# Patient Record
Sex: Female | Born: 1975 | Race: Black or African American | Hispanic: No | Marital: Married | Smoking: Never smoker
Health system: Southern US, Community
[De-identification: ages and names within clinical notes are randomized; demographics above are authoritative.]

## PROBLEM LIST (undated history)

## (undated) DIAGNOSIS — Z9289 Personal history of other medical treatment: Secondary | ICD-10-CM

## (undated) DIAGNOSIS — R519 Headache, unspecified: Secondary | ICD-10-CM

## (undated) DIAGNOSIS — J45909 Unspecified asthma, uncomplicated: Secondary | ICD-10-CM

## (undated) DIAGNOSIS — D649 Anemia, unspecified: Secondary | ICD-10-CM

## (undated) DIAGNOSIS — K589 Irritable bowel syndrome without diarrhea: Secondary | ICD-10-CM

## (undated) DIAGNOSIS — G5603 Carpal tunnel syndrome, bilateral upper limbs: Secondary | ICD-10-CM

## (undated) DIAGNOSIS — E282 Polycystic ovarian syndrome: Secondary | ICD-10-CM

## (undated) DIAGNOSIS — R51 Headache: Secondary | ICD-10-CM

## (undated) DIAGNOSIS — Z8742 Personal history of other diseases of the female genital tract: Secondary | ICD-10-CM

## (undated) DIAGNOSIS — F419 Anxiety disorder, unspecified: Secondary | ICD-10-CM

## (undated) DIAGNOSIS — Z8759 Personal history of other complications of pregnancy, childbirth and the puerperium: Secondary | ICD-10-CM

## (undated) HISTORY — DX: Anxiety disorder, unspecified: F41.9

## (undated) HISTORY — PX: OTHER SURGICAL HISTORY: SHX169

## (undated) HISTORY — DX: Polycystic ovarian syndrome: E28.2

## (undated) HISTORY — DX: Anemia, unspecified: D64.9

---

## 1999-05-03 DIAGNOSIS — E282 Polycystic ovarian syndrome: Secondary | ICD-10-CM | POA: Insufficient documentation

## 1999-07-01 ENCOUNTER — Ambulatory Visit (HOSPITAL_COMMUNITY): Admission: RE | Admit: 1999-07-01 | Discharge: 1999-07-01 | Payer: Self-pay | Admitting: Obstetrics and Gynecology

## 1999-12-12 ENCOUNTER — Other Ambulatory Visit: Admission: RE | Admit: 1999-12-12 | Discharge: 1999-12-12 | Payer: Self-pay | Admitting: Obstetrics and Gynecology

## 2001-02-08 ENCOUNTER — Other Ambulatory Visit: Admission: RE | Admit: 2001-02-08 | Discharge: 2001-02-08 | Payer: Self-pay | Admitting: *Deleted

## 2002-02-17 ENCOUNTER — Emergency Department (HOSPITAL_COMMUNITY): Admission: EM | Admit: 2002-02-17 | Discharge: 2002-02-17 | Payer: Self-pay | Admitting: Emergency Medicine

## 2002-06-09 ENCOUNTER — Emergency Department (HOSPITAL_COMMUNITY): Admission: EM | Admit: 2002-06-09 | Discharge: 2002-06-09 | Payer: Self-pay | Admitting: Emergency Medicine

## 2003-08-17 ENCOUNTER — Emergency Department (HOSPITAL_COMMUNITY): Admission: EM | Admit: 2003-08-17 | Discharge: 2003-08-17 | Payer: Self-pay | Admitting: Emergency Medicine

## 2006-06-01 ENCOUNTER — Emergency Department (HOSPITAL_COMMUNITY): Admission: EM | Admit: 2006-06-01 | Discharge: 2006-06-01 | Payer: Self-pay | Admitting: Emergency Medicine

## 2007-07-29 ENCOUNTER — Emergency Department (HOSPITAL_COMMUNITY): Admission: EM | Admit: 2007-07-29 | Discharge: 2007-07-29 | Payer: Self-pay | Admitting: Emergency Medicine

## 2007-08-27 ENCOUNTER — Ambulatory Visit: Payer: Self-pay | Admitting: Internal Medicine

## 2007-08-27 LAB — CONVERTED CEMR LAB
Basophils Absolute: 0 10*3/uL (ref 0.0–0.1)
Basophils Relative: 0 % (ref 0–1)
Eosinophils Relative: 0 % (ref 0–5)
HCT: 30.8 % — ABNORMAL LOW (ref 36.0–46.0)
Hemoglobin: 10 g/dL — ABNORMAL LOW (ref 12.0–15.0)
Lymphocytes Relative: 33 % (ref 12–46)
MCHC: 32.5 g/dL (ref 30.0–36.0)
Monocytes Absolute: 0.5 10*3/uL (ref 0.2–0.7)
Platelets: 295 10*3/uL (ref 150–400)
RDW: 13.8 % (ref 11.5–14.0)
TSH: 0.61 microintl units/mL (ref 0.350–5.50)

## 2007-10-28 DIAGNOSIS — N949 Unspecified condition associated with female genital organs and menstrual cycle: Secondary | ICD-10-CM

## 2007-10-28 DIAGNOSIS — N946 Dysmenorrhea, unspecified: Secondary | ICD-10-CM

## 2008-12-11 ENCOUNTER — Ambulatory Visit (HOSPITAL_COMMUNITY): Admission: RE | Admit: 2008-12-11 | Discharge: 2008-12-11 | Payer: Self-pay | Admitting: *Deleted

## 2009-01-30 ENCOUNTER — Ambulatory Visit (HOSPITAL_COMMUNITY): Admission: RE | Admit: 2009-01-30 | Discharge: 2009-01-30 | Payer: Self-pay | Admitting: Family Medicine

## 2009-03-01 ENCOUNTER — Ambulatory Visit (HOSPITAL_COMMUNITY): Admission: RE | Admit: 2009-03-01 | Discharge: 2009-03-01 | Payer: Self-pay | Admitting: Family Medicine

## 2009-03-07 ENCOUNTER — Inpatient Hospital Stay (HOSPITAL_COMMUNITY): Admission: AD | Admit: 2009-03-07 | Discharge: 2009-03-07 | Payer: Self-pay | Admitting: Family Medicine

## 2009-03-07 ENCOUNTER — Ambulatory Visit: Payer: Self-pay | Admitting: Physician Assistant

## 2009-05-25 ENCOUNTER — Inpatient Hospital Stay (HOSPITAL_COMMUNITY): Admission: AD | Admit: 2009-05-25 | Discharge: 2009-05-25 | Payer: Self-pay | Admitting: Obstetrics & Gynecology

## 2009-05-25 ENCOUNTER — Ambulatory Visit: Payer: Self-pay | Admitting: Obstetrics and Gynecology

## 2009-06-21 ENCOUNTER — Ambulatory Visit: Payer: Self-pay | Admitting: Advanced Practice Midwife

## 2009-06-21 ENCOUNTER — Inpatient Hospital Stay (HOSPITAL_COMMUNITY): Admission: AD | Admit: 2009-06-21 | Discharge: 2009-06-21 | Payer: Self-pay | Admitting: Obstetrics & Gynecology

## 2009-06-23 ENCOUNTER — Inpatient Hospital Stay (HOSPITAL_COMMUNITY): Admission: AD | Admit: 2009-06-23 | Discharge: 2009-06-23 | Payer: Self-pay | Admitting: Obstetrics & Gynecology

## 2009-06-26 ENCOUNTER — Ambulatory Visit (HOSPITAL_COMMUNITY): Admission: RE | Admit: 2009-06-26 | Discharge: 2009-06-26 | Payer: Self-pay | Admitting: Obstetrics & Gynecology

## 2009-06-29 ENCOUNTER — Ambulatory Visit: Payer: Self-pay | Admitting: Obstetrics and Gynecology

## 2009-06-29 ENCOUNTER — Inpatient Hospital Stay (HOSPITAL_COMMUNITY): Admission: AD | Admit: 2009-06-29 | Discharge: 2009-06-29 | Payer: Self-pay | Admitting: Obstetrics & Gynecology

## 2009-07-03 ENCOUNTER — Ambulatory Visit: Payer: Self-pay | Admitting: Obstetrics & Gynecology

## 2009-07-03 ENCOUNTER — Inpatient Hospital Stay (HOSPITAL_COMMUNITY): Admission: AD | Admit: 2009-07-03 | Discharge: 2009-07-07 | Payer: Self-pay | Admitting: Obstetrics & Gynecology

## 2009-07-05 HISTORY — PX: OTHER SURGICAL HISTORY: SHX169

## 2009-07-09 ENCOUNTER — Inpatient Hospital Stay (HOSPITAL_COMMUNITY): Admission: AD | Admit: 2009-07-09 | Discharge: 2009-07-09 | Payer: Self-pay | Admitting: Obstetrics & Gynecology

## 2009-07-09 ENCOUNTER — Encounter: Payer: Self-pay | Admitting: Obstetrics & Gynecology

## 2009-07-09 ENCOUNTER — Ambulatory Visit: Payer: Self-pay | Admitting: Vascular Surgery

## 2009-07-11 ENCOUNTER — Ambulatory Visit: Payer: Self-pay | Admitting: Obstetrics & Gynecology

## 2009-07-11 ENCOUNTER — Inpatient Hospital Stay (HOSPITAL_COMMUNITY): Admission: AD | Admit: 2009-07-11 | Discharge: 2009-07-15 | Payer: Self-pay | Admitting: Obstetrics & Gynecology

## 2009-07-11 ENCOUNTER — Ambulatory Visit: Payer: Self-pay | Admitting: Cardiovascular Disease

## 2009-07-12 ENCOUNTER — Encounter: Payer: Self-pay | Admitting: Obstetrics & Gynecology

## 2009-07-18 ENCOUNTER — Encounter: Payer: Self-pay | Admitting: Physician Assistant

## 2009-07-18 ENCOUNTER — Ambulatory Visit: Payer: Self-pay | Admitting: Obstetrics & Gynecology

## 2009-07-18 LAB — CONVERTED CEMR LAB
Basophils Absolute: 0 10*3/uL (ref 0.0–0.1)
Basophils Relative: 0 % (ref 0–1)
Lymphocytes Relative: 22 % (ref 12–46)
MCHC: 31.6 g/dL (ref 30.0–36.0)
Neutro Abs: 3.1 10*3/uL (ref 1.7–7.7)
Neutrophils Relative %: 65 % (ref 43–77)
Platelets: 355 10*3/uL (ref 150–400)
RDW: 15.2 % (ref 11.5–15.5)

## 2009-07-20 ENCOUNTER — Inpatient Hospital Stay (HOSPITAL_COMMUNITY): Admission: AD | Admit: 2009-07-20 | Discharge: 2009-07-20 | Payer: Self-pay | Admitting: Obstetrics & Gynecology

## 2009-07-20 ENCOUNTER — Ambulatory Visit: Payer: Self-pay | Admitting: Obstetrics & Gynecology

## 2009-07-25 ENCOUNTER — Ambulatory Visit: Payer: Self-pay | Admitting: Obstetrics and Gynecology

## 2009-08-01 ENCOUNTER — Ambulatory Visit: Payer: Self-pay | Admitting: Obstetrics and Gynecology

## 2009-08-01 ENCOUNTER — Encounter: Payer: Self-pay | Admitting: Obstetrics & Gynecology

## 2009-08-01 LAB — CONVERTED CEMR LAB
Basophils Absolute: 0 10*3/uL (ref 0.0–0.1)
Basophils Relative: 0 % (ref 0–1)
Eosinophils Absolute: 0 10*3/uL (ref 0.0–0.7)
Eosinophils Relative: 1 % (ref 0–5)
HCT: 32 % — ABNORMAL LOW (ref 36.0–46.0)
Hemoglobin: 10.6 g/dL — ABNORMAL LOW (ref 12.0–15.0)
Lymphocytes Relative: 33 % (ref 12–46)
Lymphs Abs: 1.7 10*3/uL (ref 0.7–4.0)
MCHC: 33.1 g/dL (ref 30.0–36.0)
MCV: 91.4 fL (ref 78.0–100.0)
Monocytes Absolute: 0.4 10*3/uL (ref 0.1–1.0)
Monocytes Relative: 8 % (ref 3–12)
Neutro Abs: 3 10*3/uL (ref 1.7–7.7)
Neutrophils Relative %: 59 % (ref 43–77)
Platelets: 417 10*3/uL — ABNORMAL HIGH (ref 150–400)
RBC: 3.5 M/uL — ABNORMAL LOW (ref 3.87–5.11)
RDW: 14.2 % (ref 11.5–15.5)
WBC: 5.1 10*3/uL (ref 4.0–10.5)

## 2009-08-10 ENCOUNTER — Ambulatory Visit: Payer: Self-pay | Admitting: Obstetrics & Gynecology

## 2010-01-11 IMAGING — CT CT ABDOMEN W/ CM
3 of 4 series · 14 of 32 positions shown, 19 images · IV contrast ([ID] GASTRO-MX & 150ml omni/300%)
Comparison: 07/29/2007

CT ABDOMEN

CLINICAL DATA: 2 weeks postpartum vaginal delivery, left lower
quadrant pelvic pain and fever.

CT ABDOMEN AND PELVIS WITH CONTRAST
TECHNIQUE: Multidetector CT imaging of the abdomen and pelvis was
performed using the standard protocol following bolus
administration of intravenous contrast.
Contrast: 150 ml Omniscan 300 IV contrast

[Series 2: abd pelvis · axial · 0.79mm/px · z∈[-441,-151]mm · 4 of 93 slices shown, 9 images]
[im 19/93  soft-tissue]
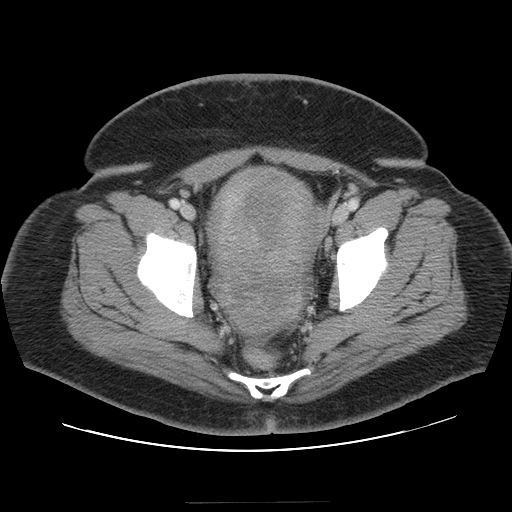
[im 19/93  lung]
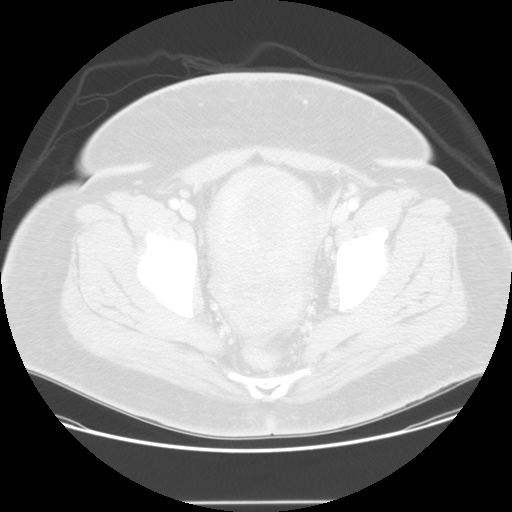
[im 19/93  bone]
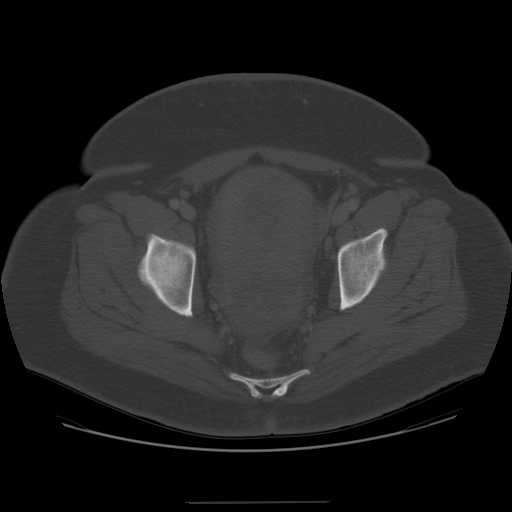
[im 37/93  soft-tissue]
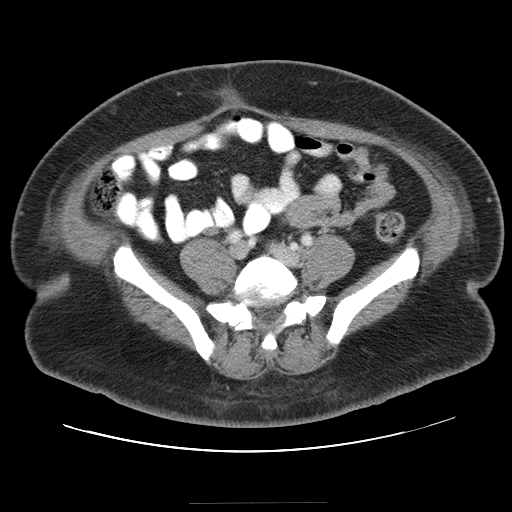
[im 37/93  lung]
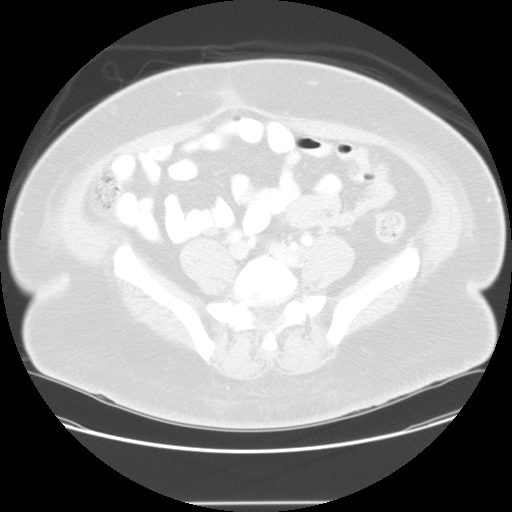
[im 56/93  soft-tissue]
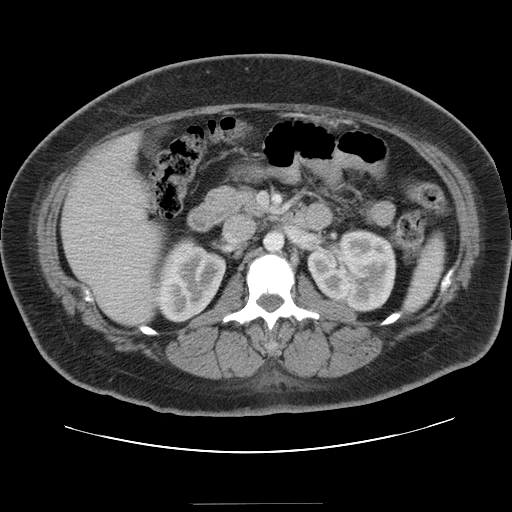
[im 56/93  lung]
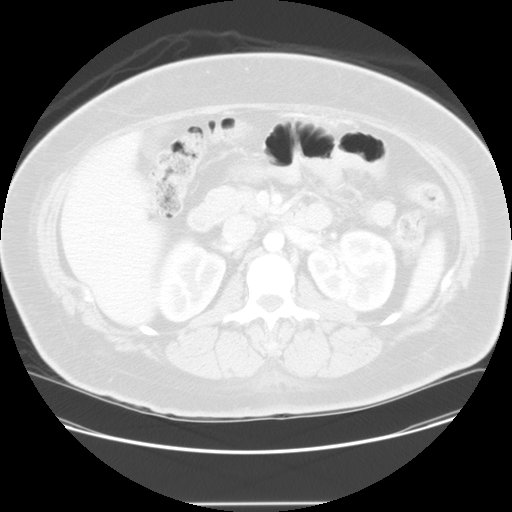
[im 74/93  soft-tissue]
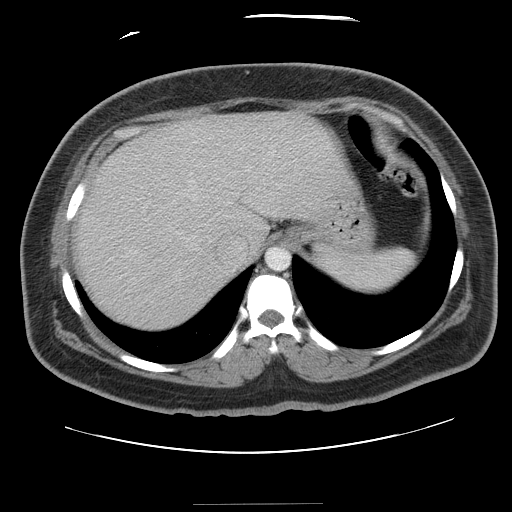
[im 74/93  lung]
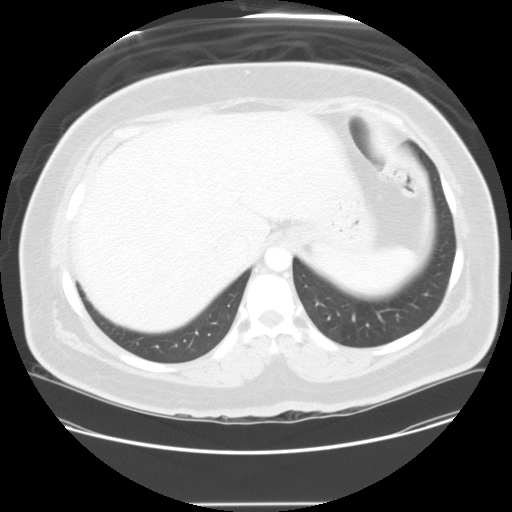

[Series 400: reformatted · coronal · 0.99mm/px · 2 of 169 slices shown (1 of 2)]
[im 17/169  soft-tissue]
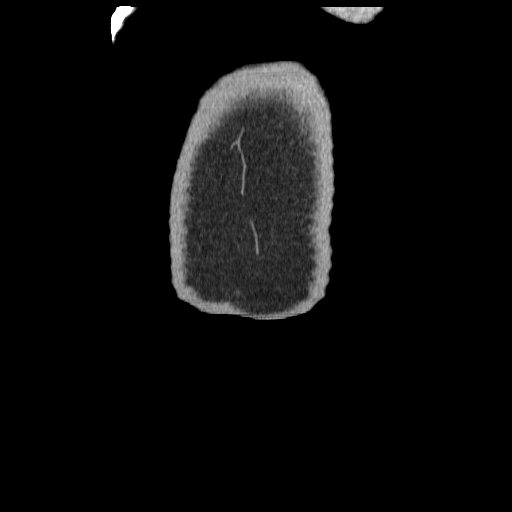
[im 34/169  soft-tissue]
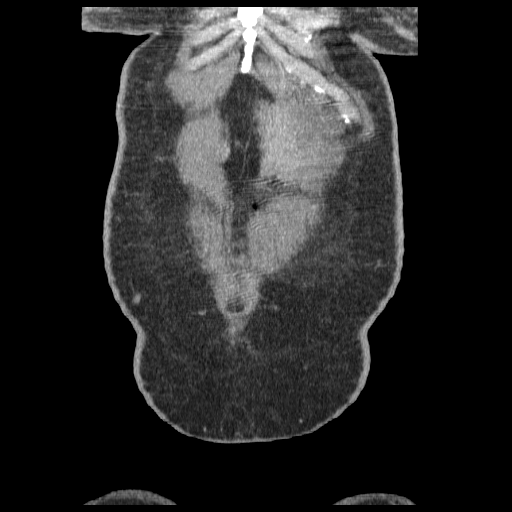

[Series 401: reformatted · sagittal · 0.99mm/px · 8 of 192 slices shown (2 of 2)]
[im 16/192  soft-tissue]
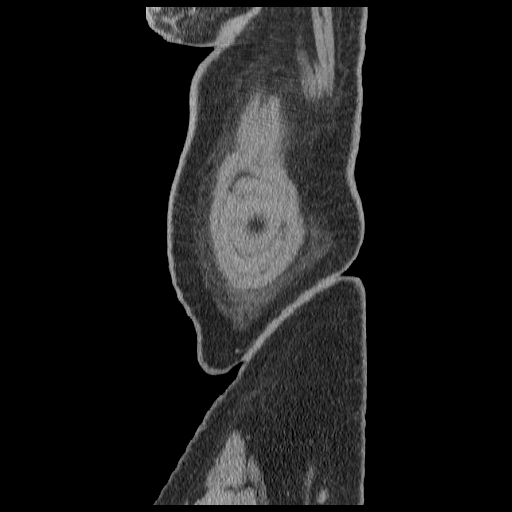
[im 48/192  soft-tissue]
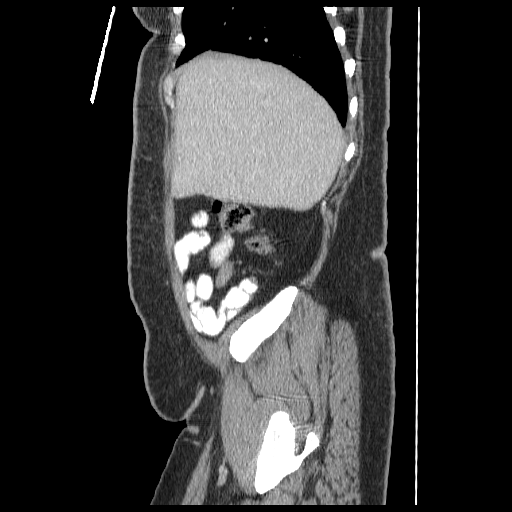
[im 64/192  soft-tissue]
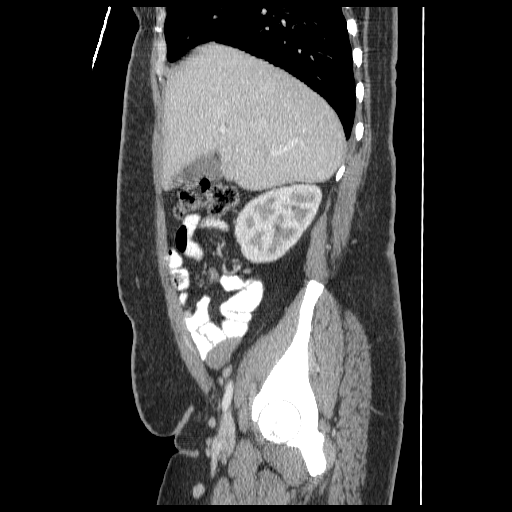
[im 80/192  soft-tissue]
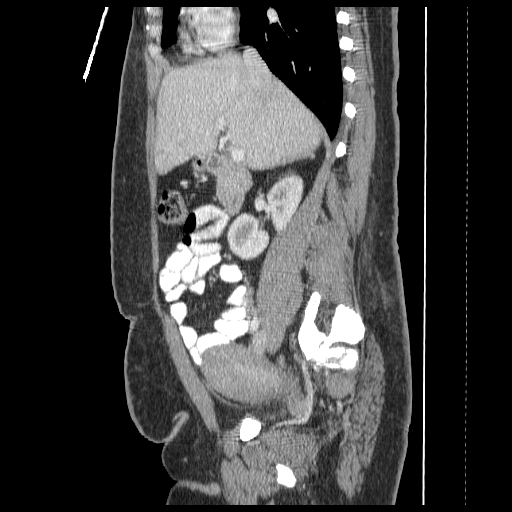
[im 112/192  soft-tissue]
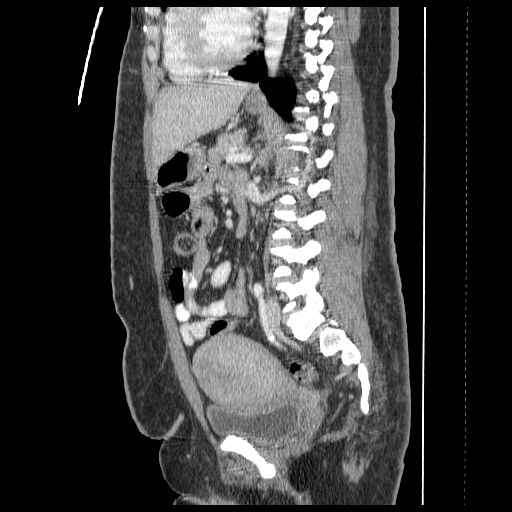
[im 128/192  soft-tissue]
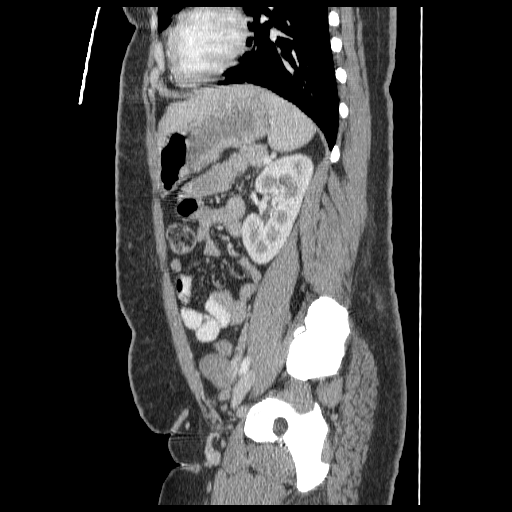
[im 144/192  soft-tissue]
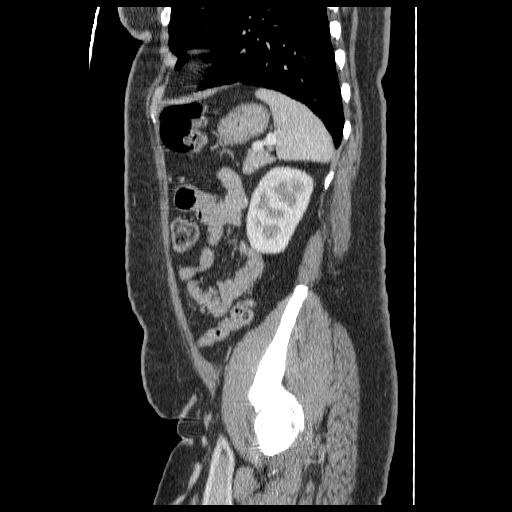
[im 176/192  soft-tissue]
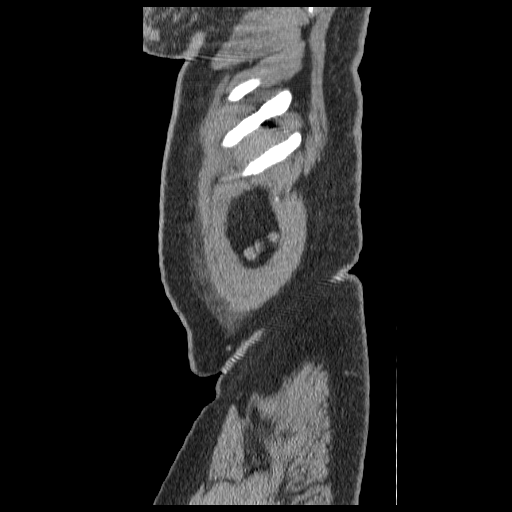

[14 of 32 positions shown; findings below may reference images not displayed]

FINDINGS: 2 mm nonobstructing right upper renal pole calculus
incidentally identified.  Left renal cortical too small to
characterize hypodensity noted on image 41 and 36.  Liver,
gallbladder, pancreas, adrenal glands, and spleen are unremarkable.
No ureteral calculus identified.  No lymphadenopathy or free fluid.
IMPRESSION: No acute intra-abdominal finding.

CT PELVIS
FINDINGS: Uterus and ovaries are unremarkable for the patient's
recently postpartum state.  Trace pelvic free fluid identified.
Appendix and bowel are unremarkable.  No acute osseous finding.
IMPRESSION: No acute intrapelvic process.  Normal postpartum appearance of the
uterus and ovaries.

## 2011-01-20 ENCOUNTER — Encounter: Payer: Self-pay | Admitting: *Deleted

## 2011-04-05 LAB — POCT URINALYSIS DIP (DEVICE)
Glucose, UA: NEGATIVE mg/dL
Ketones, ur: NEGATIVE mg/dL
Nitrite: NEGATIVE
Specific Gravity, Urine: 1.01 (ref 1.005–1.030)
pH: 6.5 (ref 5.0–8.0)

## 2011-04-06 LAB — COMPREHENSIVE METABOLIC PANEL
ALT: 43 U/L — ABNORMAL HIGH (ref 0–35)
ALT: 73 U/L — ABNORMAL HIGH (ref 0–35)
AST: 42 U/L — ABNORMAL HIGH (ref 0–37)
AST: 32 U/L (ref 0–37)
AST: 48 U/L — ABNORMAL HIGH (ref 0–37)
AST: 66 U/L — ABNORMAL HIGH (ref 0–37)
AST: 78 U/L — ABNORMAL HIGH (ref 0–37)
Albumin: 3.6 g/dL (ref 3.5–5.2)
Albumin: 2.5 g/dL — ABNORMAL LOW (ref 3.5–5.2)
Albumin: 2.7 g/dL — ABNORMAL LOW (ref 3.5–5.2)
Albumin: 2.8 g/dL — ABNORMAL LOW (ref 3.5–5.2)
Alkaline Phosphatase: 105 U/L (ref 39–117)
Alkaline Phosphatase: 115 U/L (ref 39–117)
Alkaline Phosphatase: 146 U/L — ABNORMAL HIGH (ref 39–117)
BUN: 10 mg/dL (ref 6–23)
BUN: 11 mg/dL (ref 6–23)
BUN: 12 mg/dL (ref 6–23)
BUN: 9 mg/dL (ref 6–23)
CO2: 24 mEq/L (ref 19–32)
CO2: 22 mEq/L (ref 19–32)
CO2: 23 mEq/L (ref 19–32)
CO2: 24 mEq/L (ref 19–32)
Calcium: 9 mg/dL (ref 8.4–10.5)
Calcium: 8.5 mg/dL (ref 8.4–10.5)
Calcium: 8.8 mg/dL (ref 8.4–10.5)
Calcium: 8.8 mg/dL (ref 8.4–10.5)
Chloride: 107 mEq/L (ref 96–112)
Chloride: 108 mEq/L (ref 96–112)
Chloride: 108 mEq/L (ref 96–112)
Creatinine, Ser: 0.73 mg/dL (ref 0.4–1.2)
Creatinine, Ser: 0.48 mg/dL (ref 0.4–1.2)
Creatinine, Ser: 0.61 mg/dL (ref 0.4–1.2)
Creatinine, Ser: 0.72 mg/dL (ref 0.4–1.2)
Creatinine, Ser: 0.83 mg/dL (ref 0.4–1.2)
GFR calc Af Amer: >60 mL/min (ref >60)
GFR calc Af Amer: >60 mL/min (ref >60)
GFR calc Af Amer: >60 mL/min (ref >60)
GFR calc Af Amer: >60 mL/min (ref >60)
GFR calc Af Amer: >60 mL/min (ref >60)
GFR calc non Af Amer: >60 mL/min (ref >60)
GFR calc non Af Amer: >60 mL/min (ref >60)
GFR calc non Af Amer: >60 mL/min (ref >60)
GFR calc non Af Amer: >60 mL/min (ref >60)
GFR calc non Af Amer: >60 mL/min (ref >60)
Glucose, Bld: 92 mg/dL (ref 70–99)
Glucose, Bld: 118 mg/dL — ABNORMAL HIGH (ref 70–99)
Glucose, Bld: 80 mg/dL (ref 70–99)
Glucose, Bld: 87 mg/dL (ref 70–99)
Potassium: 4 mEq/L (ref 3.5–5.1)
Potassium: 3.4 mEq/L — ABNORMAL LOW (ref 3.5–5.1)
Potassium: 4 mEq/L (ref 3.5–5.1)
Potassium: 4 mEq/L (ref 3.5–5.1)
Sodium: 140 mEq/L (ref 135–145)
Sodium: 137 mEq/L (ref 135–145)
Sodium: 140 mEq/L (ref 135–145)
Total Bilirubin: 0.2 mg/dL — ABNORMAL LOW (ref 0.3–1.2)
Total Bilirubin: 0.4 mg/dL (ref 0.3–1.2)
Total Bilirubin: 0.5 mg/dL (ref 0.3–1.2)
Total Protein: 6 g/dL (ref 6.0–8.3)
Total Protein: 6.4 g/dL (ref 6.0–8.3)
Total Protein: 4.9 g/dL — ABNORMAL LOW (ref 6.0–8.3)
Total Protein: 5.5 g/dL — ABNORMAL LOW (ref 6.0–8.3)
Total Protein: 5.9 g/dL — ABNORMAL LOW (ref 6.0–8.3)

## 2011-04-06 LAB — URINALYSIS, ROUTINE W REFLEX MICROSCOPIC
Bilirubin Urine: NEGATIVE
Bilirubin Urine: NEGATIVE
Glucose, UA: NEGATIVE mg/dL
Hgb urine dipstick: NEGATIVE
Hgb urine dipstick: NEGATIVE
Ketones, ur: NEGATIVE mg/dL
Ketones, ur: NEGATIVE mg/dL
Nitrite: NEGATIVE
Protein, ur: NEGATIVE mg/dL
Specific Gravity, Urine: <1.005 — ABNORMAL LOW (ref 1.005–1.030)
Urobilinogen, UA: 0.2 mg/dL (ref 0.0–1.0)
Urobilinogen, UA: 0.2 mg/dL (ref 0.0–1.0)
pH: 7.5 (ref 5.0–8.0)

## 2011-04-06 LAB — BRAIN NATRIURETIC PEPTIDE: Pro B Natriuretic peptide (BNP): 163 pg/mL — ABNORMAL HIGH (ref 0.0–100.0)

## 2011-04-06 LAB — CBC
HCT: 25.1 % — ABNORMAL LOW (ref 36.0–46.0)
HCT: 17.9 % — ABNORMAL LOW (ref 36.0–46.0)
HCT: 19 % — ABNORMAL LOW (ref 36.0–46.0)
HCT: 19.2 % — ABNORMAL LOW (ref 36.0–46.0)
HCT: 20.7 % — ABNORMAL LOW (ref 36.0–46.0)
HCT: 24.6 % — ABNORMAL LOW (ref 36.0–46.0)
HCT: 32.6 % — ABNORMAL LOW (ref 36.0–46.0)
Hemoglobin: 8.7 g/dL — ABNORMAL LOW (ref 12.0–15.0)
Hemoglobin: 10.1 g/dL — ABNORMAL LOW (ref 12.0–15.0)
Hemoglobin: 6.7 g/dL — CL (ref 12.0–15.0)
Hemoglobin: 7.6 g/dL — CL (ref 12.0–15.0)
MCHC: 34.6 g/dL (ref 30.0–36.0)
MCHC: 34.4 g/dL (ref 30.0–36.0)
MCHC: 34.7 g/dL (ref 30.0–36.0)
MCHC: 34.9 g/dL (ref 30.0–36.0)
MCHC: 34.9 g/dL (ref 30.0–36.0)
MCHC: 35.2 g/dL (ref 30.0–36.0)
MCHC: 35.6 g/dL (ref 30.0–36.0)
MCV: 97.9 fL (ref 78.0–100.0)
MCV: 98.5 fL (ref 78.0–100.0)
MCV: 98.8 fL (ref 78.0–100.0)
MCV: 99 fL (ref 78.0–100.0)
MCV: 99.3 fL (ref 78.0–100.0)
Platelets: 274 10*3/uL (ref 150–400)
Platelets: 299 10*3/uL (ref 150–400)
Platelets: 175 10*3/uL (ref 150–400)
Platelets: 176 10*3/uL (ref 150–400)
Platelets: 183 10*3/uL (ref 150–400)
Platelets: 222 10*3/uL (ref 150–400)
Platelets: 282 10*3/uL (ref 150–400)
RBC: 2.12 MIL/uL — ABNORMAL LOW (ref 3.87–5.11)
RBC: 1.92 MIL/uL — ABNORMAL LOW (ref 3.87–5.11)
RBC: 2.75 MIL/uL — ABNORMAL LOW (ref 3.87–5.11)
RBC: 2.89 MIL/uL — ABNORMAL LOW (ref 3.87–5.11)
RBC: 3.3 MIL/uL — ABNORMAL LOW (ref 3.87–5.11)
RDW: 15.3 % (ref 11.5–15.5)
RDW: 15 % (ref 11.5–15.5)
RDW: 15 % (ref 11.5–15.5)
RDW: 15.1 % (ref 11.5–15.5)
RDW: 15.3 % (ref 11.5–15.5)
RDW: 15.6 % — ABNORMAL HIGH (ref 11.5–15.5)
WBC: 6 10*3/uL (ref 4.0–10.5)
WBC: 7.9 10*3/uL (ref 4.0–10.5)
WBC: 8.2 10*3/uL (ref 4.0–10.5)

## 2011-04-06 LAB — BASIC METABOLIC PANEL
BUN: 10 mg/dL (ref 6–23)
BUN: 8 mg/dL (ref 6–23)
Calcium: 8.5 mg/dL (ref 8.4–10.5)
Calcium: 8.6 mg/dL (ref 8.4–10.5)
Creatinine, Ser: 0.7 mg/dL (ref 0.4–1.2)
Creatinine, Ser: 0.74 mg/dL (ref 0.4–1.2)
GFR calc Af Amer: >60 mL/min (ref >60)
GFR calc non Af Amer: >60 mL/min (ref >60)
GFR calc non Af Amer: >60 mL/min (ref >60)
Glucose, Bld: 81 mg/dL (ref 70–99)
Potassium: 3.8 mEq/L (ref 3.5–5.1)

## 2011-04-06 LAB — CROSSMATCH
ABO/RH(D): O POS
Antibody Screen: NEGATIVE

## 2011-04-06 LAB — URINE MICROSCOPIC-ADD ON

## 2011-04-06 LAB — URIC ACID: Uric Acid, Serum: 4.3 mg/dL (ref 2.4–7.0)

## 2011-04-06 LAB — LACTATE DEHYDROGENASE: LDH: 151 U/L (ref 94–250)

## 2011-04-06 LAB — CULTURE, BLOOD (ROUTINE X 2)
Culture: NO GROWTH
Culture: NO GROWTH

## 2011-04-06 LAB — TSH: TSH: 2.555 u[IU]/mL (ref 0.350–4.500)

## 2011-04-06 LAB — FIBRINOGEN: Fibrinogen: 445 mg/dL (ref 204–475)

## 2011-04-06 LAB — CCBB MATERNAL DONOR DRAW

## 2011-04-07 LAB — COMPREHENSIVE METABOLIC PANEL
ALT: 18 U/L (ref 0–35)
AST: 22 U/L (ref 0–37)
Albumin: 2.5 g/dL — ABNORMAL LOW (ref 3.5–5.2)
CO2: 21 mEq/L (ref 19–32)
Calcium: 8.8 mg/dL (ref 8.4–10.5)
Chloride: 110 mEq/L (ref 96–112)
Creatinine, Ser: 0.53 mg/dL (ref 0.4–1.2)
GFR calc Af Amer: >60 mL/min (ref >60)
Sodium: 138 mEq/L (ref 135–145)

## 2011-04-07 LAB — CBC
MCHC: 35.7 g/dL (ref 30.0–36.0)
MCV: 97.5 fL (ref 78.0–100.0)
Platelets: 159 10*3/uL (ref 150–400)
RBC: 2.7 MIL/uL — ABNORMAL LOW (ref 3.87–5.11)
WBC: 8.5 10*3/uL (ref 4.0–10.5)

## 2011-04-07 LAB — PROTEIN, URINE, 24 HOUR
Collection Interval-UPROT: 24 hours
Protein, Urine: <3 mg/dL
Urine Total Volume-UPROT: 5950 mL

## 2011-04-07 LAB — URINALYSIS, DIPSTICK ONLY
Glucose, UA: NEGATIVE mg/dL
Ketones, ur: NEGATIVE mg/dL
Protein, ur: NEGATIVE mg/dL
Urobilinogen, UA: 0.2 mg/dL (ref 0.0–1.0)

## 2011-04-07 LAB — CREATININE CLEARANCE, URINE, 24 HOUR
Creatinine Clearance: 192 mL/min — ABNORMAL HIGH (ref 75–115)
Creatinine, 24H Ur: 1685 mg/d (ref 700–1800)
Creatinine: 0.61 mg/dL (ref 0.40–1.20)

## 2011-04-08 LAB — CBC
HCT: 30.8 % — ABNORMAL LOW (ref 36.0–46.0)
MCHC: 35.7 g/dL (ref 30.0–36.0)
MCV: 97.5 fL (ref 78.0–100.0)
Platelets: 183 10*3/uL (ref 150–400)
RDW: 13.4 % (ref 11.5–15.5)
WBC: 9.3 10*3/uL (ref 4.0–10.5)

## 2011-04-10 LAB — COMPREHENSIVE METABOLIC PANEL
AST: 22 U/L (ref 0–37)
Albumin: 3.3 g/dL — ABNORMAL LOW (ref 3.5–5.2)
Alkaline Phosphatase: 49 U/L (ref 39–117)
BUN: 5 mg/dL — ABNORMAL LOW (ref 6–23)
GFR calc Af Amer: >60 mL/min (ref >60)
Potassium: 3.6 mEq/L (ref 3.5–5.1)
Total Protein: 6.3 g/dL (ref 6.0–8.3)

## 2011-04-10 LAB — CBC
HCT: 30.5 % — ABNORMAL LOW (ref 36.0–46.0)
Platelets: 205 10*3/uL (ref 150–400)
RDW: 13.9 % (ref 11.5–15.5)

## 2011-05-13 NOTE — Discharge Summary (Signed)
NAME:  Erica Lang, Erica Lang NO.:  [id number]   MEDICAL RECORD NO.:  [id number]          PATIENT TYPE:  INP   LOCATION:  9305                          [facility]   PHYSICIAN:  Allie Bossier, MD        DATE OF BIRTH:  12-03-1976   DATE OF ADMISSION:  07/11/2009  DATE OF DISCHARGE:  07/15/2009                               DISCHARGE SUMMARY   ADMISSION DIAGNOSIS:  Postpartum pulmonary edema.   OTHER ADMITTING DIAGNOSES:  Morbid obesity, hypertension, history of  preeclampsia with recent vaginal delivery, and anemia.   CONDITION:  Stable.   DISPOSITION:  Home.   DIET:  4 g of sodium diet.   FOLLOWUP:  Follow up 1 week in the GYN Clinic and follow up in 1 month  with the [facility]iology [facility].   HISTORY OF PRESENT ILLNESS AND HOSPITAL COURSE:  Erica Lang is a 35-  year-old, who was delivered vaginally a baby on July 05, 2009.  During  that time, she was treated for preeclampsia with magnesium sulfate,  please note that her she was anemic at that time, and a vaginal hematoma  was developed.  She did receive transfusions during the hospital stay.  She returned to the hospital on July 11, 2009, secondary to significant  shortness of breath.  Please note that she had been receiving  hydrochlorothiazide when she was discharged to home.  However, she had  been given a prescription for hydrochlorothiazide 25 mg when she was  discharged to home after her delivery.  She returned on July 11, 2009,  shortness of breath and was admitted to the ICU for diuresis.  A  Cardiology consult was obtained.  An echocardiogram showed some mild  abnormalities though be followed up on in the future and EKG was normal.  Her x-ray on the day of admission showed bilateral pleural effusion and  by July 13, 2009, her chest x-ray was normal.  Throughout her hospital  stay, she tolerated p.o. well.  Her diuresis was accomplished with  multiple doses of Lasix and she eventually lost almost 30 pounds  since  her admission day.   DISCHARGE MEDICATIONS:  1. Potassium chloride 10 mEq daily.  2. Lasix 40 mg daily.  3. Labetalol 400 mg b.i.d.   By day of discharge, her systolic blood pressure was ranging in 130s-  150s over 80s-90s.  Her hemoglobin remained at 7.2 throughout her  hospital course.  Her BMET by the day of discharge was fairly normal.  She will follow up in a week for BMET to follow her potassium and a CBC.      Allie Bossier, MD  Electronically Signed     MCD/MEDQ  D:  07/15/2009  T:  07/16/2009  Job:  [id number]

## 2011-05-13 NOTE — Consult Note (Signed)
NAME:  Erica Lang, Erica Lang NO.:  [id number]   MEDICAL RECORD NO.:  [id number]          PATIENT TYPE:  INP   LOCATION:                                [facility]   PHYSICIAN:  Veverly Fells. Excell Seltzer, MD  DATE OF BIRTH:  1976/03/29   DATE OF CONSULTATION:  07/13/2009  DATE OF DISCHARGE:                                 CONSULTATION   REASON FOR CONSULTATION:  Congestive heart failure.   HISTORY OF PRESENT ILLNESS:  Ms. Erica Lang is a 35 year old African  American woman who is now 8 days postpartum after her first delivery of  a healthy girl.  She developed lower extremity edema throughout much of  her pregnancy.  She also has severe gestational hypertension, presenting  with headaches and visual disturbance at full term.  She was treated  with magnesium sulfate and then underwent Pitocin and vaginal delivery.  She required incision and drainage of a left vulvar hematoma in her  early postpartum.  The patient was discharged home, but returned with  symptoms of pulmonary edema on July 09, 2009 and again on July 11, 2009.  She was found to have interstitial edema on chest x-ray.  Her symptoms  include shortness of breath and lower extremity edema.  Her leg swelling  became much worse in the postpartum.  The patient was admitted and  treated with IV Lasix.  At this point, she reports some improvement in  her edema, but she continues to have bilateral leg soreness and  swelling.  Her shortness of breath persists, but has also improved.  She  denies orthopnea or PND at present.  She denies chest pain.  She has no  history of cardiac problems.  She was previously well before her  pregnancy.  She was taking no medications and essentially had no medical  problems.   PAST MEDICAL HISTORY:  1. Gestational hypertension.  2. Anemia.  3. Asthma, mild.  4. Headaches.  5. History of urinary tract infection.   SOCIAL HISTORY:  The patient lives locally in [place] with her  boyfriend.  She has a new born daughter.  No previous pregnancies.  She  does not smoke cigarettes or drink alcohol.   FAMILY HISTORY:  The patient's mother has had multiple DVTs and is on  Coumadin.  She does not know her father's history.  There is no coronary  artery disease or congestive heart failure in the family.   CURRENT MEDICATIONS:  1. Lasix.  2. Labetalol 200 mg twice daily.  3. Colace.  4. Lactulose.  5. Percocet.  6. Ibuprofen as needed.   REVIEW OF SYSTEMS:  Negative except as outlined per the HPI.   PHYSICAL EXAMINATION:  GENERAL:  The patient is alert and oriented,  obese, African American woman, in no acute distress.  VITAL SIGNS:  Blood pressure 156/86, respiratory rate 20, heart rate 67,  and oxygen saturation 98% on room air, and temperature 98.1.  Her I's  and O's over the last 24 hours was -3270 mL.  HEENT:  Normal.  NECK:  Carotid upstrokes are brisk.  JVP appears  normal.  No thyromegaly  or thyroid nodules.  LUNGS:  Clear bilaterally.  HEART:  The apex is discrete and nondisplaced.  Heart, regular rate and  rhythm.  There are no murmurs or gallops.  There is a normal S2.  There  is no right ventricular heave or lift.  ABDOMEN:  Soft, mild diffuse tenderness, positive bowel sounds.  No  organomegaly.  BACK:  No CVA tenderness.  EXTREMITIES:  There is 2+ pretibial edema and pedal edema bilaterally.  Peripheral pulses are 2+ and equal throughout.  SKIN:  Warm and dry without rash.  NEUROLOGIC:  Cranial nerves II through XII are intact.  Strength is  intact and equal.   LABORATORY DATA:  CBC on admission showed a hemoglobin of 7 with  hematocrit of 20, platelet count 282,000, white blood cell count 8200.  Creatinine today was 0.7 with BUN of 10.  Potassium of 4.0.  BNP was  elevated at 163.  TSH was 2.55.   Chest x-ray showed tiny bilateral pleural effusions with mild  interstitial edema consistent with congestive heart failure.   Echocardiogram  showed normal left ventricular size and systolic function  with normal LV wall thickness.  The estimated LVEF was 60-65% and  diastolic filling parameters were within normal limits.  There was  moderate regurgitation of the mitral and tricuspid valves.  The  estimated pulmonary artery peak pressure was 48 mmHg, which is mildly  elevated.   EKG shows normal sinus rhythm is within normal limits.  The heart rate  is 64 beats per minute.   ASSESSMENT:  This is a 35 year old woman, now postpartum day #8, with  congestive heart failure in the setting of normal left ventricular  function.  I suspect her volume overload and pulmonary edema are result  of uncontrolled hypertension and postpartum fluid shifts.  The patient  appears to be clinically improving with diuresis.  Her blood pressure  remains elevated.   PLAN:  As follows:  1. Increase Lasix to 20 mg IV twice daily.  We will follow on      electrolytes closely with daily metabolic panels.  2. Increase labetalol to 400 mg twice daily for better blood pressure      control.  The patient's heart rate should tolerate this medication      increase.  If she continues to have elevated pressures after      diuresis, may need to consider a second antihypertensive agent.   We will follow the patient during her hospitalization.  Plan on  arranging outpatient followup and a repeat echocardiogram in a few  months to reassess her pulmonary artery pressures.  I suspected she had  mildly elevated  pulmonary pressures due to high left heart pressures at the time she had  pulmonary edema and congestive heart failure.   Thank you for the opportunity to see this very nice patient.  Please  feel free to call at any time with questions.      Veverly Fells. Excell Seltzer, MD  Electronically Signed     MDC/MEDQ  D:  07/13/2009  T:  07/13/2009  Job:  [id number]

## 2011-05-13 NOTE — Group Therapy Note (Signed)
NAME:  Erica Lang, Erica Lang NO.:  [id number]   MEDICAL RECORD NO.:  [id number]          PATIENT TYPE:  WOC   LOCATION:  [facility]                   FACILITY:  [facility]   PHYSICIAN:  Allie Bossier, MD        DATE OF BIRTH:  10-Apr-1976   DATE OF SERVICE:                                  CLINIC NOTE   Ms. Erica Lang is a 35 year old African American female, who presents today  6 days after being discharged from the hospital, after a readmission  with postpartum pulmonary edema.  Ms. Erica Lang is 12 days status post  vaginal delivery that needed to presented approximately 8 days  postpartum to [facility] with complaints of shortness of breast,  headache, and elevated blood pressures.  She was readmitted to the  hospital and placed on magnesium sulfate.  She was found to have a chest  x-ray that showed pulmonary edema.  The patient was started on Lasix and  her dosages of labetalol was increased.  She did under go an  echocardiogram that showed a normal left ventricular function, left  ventricular size and systolic function.  Her ejection fraction was 60-  65%.  She was discharged from the hospital on July 13, 2009, on  potassium chloride 10 mEq daily, Lasix 40 mg daily, and labetalol 400 mg  b.i.d.  She presents today for followup from the hospital.  She has  complaints of continued pain of headaches, blurry vision, and epigastric  pain.  However, she does state that yesterday morning she woke with  fever and chills.  She had a low-grade fever of 100 in spike to a T-max  of 100.4 yesterday afternoon.  She proceeded to take Motrin and Percocet  to control her pain throughout the day.  The pain that she complaints is  predominately in her hand, frontal headache, it is not localize, it does  not worsen with position changes, and again she does not have any  nausea, vomiting, or any visual disturbances.  Most of her pain is in  her perineal area, where she also had evacuation of a  labial hematoma  after delivery.  She denies any other symptoms.  She has not had any  sick content since being back in her home.   PHYSICAL EXAMINATION:  GENERAL:  Ms. Erica Lang is a well-nourished African  American female, who appears to be her stated age of 64.  She is in no  apparent distress.  She appears to be comfortable.  HEENT:  Grossly normal and she is slightly pale in appearing.  LUNGS:  Clear to auscultation, bilaterally AMP.  No wheezes or rales.  HEART:  Regular rate and rhythm.  No bruits.  There is a slight ejection  murmur that was approximately 4/6.  ABDOMEN:  Soft and nontender.  GENITALIA:  External genitalia, there is a scant amount of rubor noted  at the introitus.  The patient had a secondary labia laceration that was  repaired and are healing well.  Sutures are still in place.  There is no  drainage at times of infection at this right.  There is no  lymphadenopathy and they are appropriately tender to palpation.  EXTREMITIES:  3+ pitting edema.  They are tender to palpation.  Reflexes  are within normal limits 2+.  There is no clonus.  VITAL SIGNS:  Temperature is 99.1, pulse is 71, blood pressure is  148/91, weight is 234.5, and height is 5 feet 9 inches.   ASSESSMENT AND PLAN:  The patient is 6 days status post discharge on her  readmission with postpartum pulmonary edema and congestive heart failure  with reports a new onset fever yesterday.   PLAN:  CBC with this and close followup.  The patient should return if  she should have any further symptoms of infection, which have been  reviewed with her at length.  She will return to clinic on Friday  morning for reevaluation.  Clinic staff will call her if any abnormality  showed a blood work prior to that.     ______________________________  Maylon Cos, CNM    ______________________________  Allie Bossier, MD    SS/MEDQ  D:  07/18/2009  T:  07/19/2009  Job:  [id number]

## 2011-05-13 NOTE — Group Therapy Note (Signed)
NAME:  Erica Lang, Erica Lang NO.:  [id number]   MEDICAL RECORD NO.:  [id number]          PATIENT TYPE:  WOC   LOCATION:  [facility]                   FACILITY:  [facility]   PHYSICIAN:  Scheryl Darter, MD       DATE OF BIRTH:  02/06/1976   DATE OF SERVICE:  08/10/2009                                  CLINIC NOTE   REASON FOR VISIT:  Followup for lab results and status of endometritis  and headaches after delivery.   The patient was last seen in our clinic on August 4 with reports of  increased headache and continued pelvic pain after being treated for  endometritis.  The patient was prescribed Fioricet for the headaches and  an order was written to have a CT of the head and the patient knows to  report any fevers or complications of endometritis.  The patient was  here today reporting that she has not had a fever since last visit.  Her  pelvic pain has stopped.  Insurance declined a CT scan and the patient  will like to not do it at this time, because the Fioricet has improved  the headache tremendously as stated by the patient.  She is continuing  to take the iron.   PHYSICAL EXAMINATION:  GENERAL:  The patient is alert and oriented x3,  smiling and dressed appropriately.  No signs of acute distress.   LABORATORY FINDINGS:  Results from August 01, 2009, hemoglobin was 10.6,  hematocrit 32.0, and her white blood cell was 5.1.   ASSESSMENT:  Endometritis resolved, migraine headaches.   PLAN:  A prescription written for continuing the Fioricet.  If the pain  becomes worse or does not improve with the use of Fioricet, and at that  we would do a CT scan.  Continue with iron pills and followup for any  additional issues or concerns.      Sid Falcon, CNM    ______________________________  Scheryl Darter, MD    WM/MEDQ  D:  08/10/2009  T:  08/10/2009  Job:  [id number]

## 2011-05-13 NOTE — Group Therapy Note (Signed)
NAME:  Erica Lang, Erica Lang NO.:  [id number]   MEDICAL RECORD NO.:  [id number]          PATIENT TYPE:  WOC   LOCATION:  [facility]                   [facility]ACILITY:  [facility]   PHYSICIAN:  Jaynie Collins, MD     DATE OF BIRTH:  12-24-1976   DATE OF SERVICE:                                  CLINIC NOTE   CHIEF COMPLAINT:  Postpartum followup.   HISTORY OF PRESENT ILLNESS:  The patient is a 35 year old, gravida 1,  para 1, status post vaginal delivery on July 05, 2009.  The patient's  labor postpartum course was complicated by preeclampsia, pulmonary  edema, and a left labial hematoma, which was incised and drained.  The  patient was seen in the MAU on July 20, 2009, with a complaint of fever  102.  She had a negative examination.  The patient was noted to have  some uterine tenderness and normal pelvic examination.  She also  underwent a CT scan of her abdomen and pelvis, which showed no acute  findings to explain her fevers and that discharge diagnosis was of  endometritis and the patient was sent home with a cefpodoxime and  metronidazole to complete a 14-day course.  The patient was seen for  followup on July 25, 2009, and at that point she was noted to have a low-  grade temperature of 100.0.  She had a negative evaluation apart from  slight uterine tenderness and no other symptoms.  The recommendation  after that visit was for the patient to continue her antibiotic therapy  for her postpartum endometritis.  On encounter today, the patient does  report that her last temperature was on Sunday, July 29, 2009, 102  degrees Fahrenheit.  She also reports that she has been having daily  headaches that are severe.  She rated her pain at 8/10 and the headaches  are associated with sparkles.  The patient is very concerned about her  persistent headaches, which are not helped by Tylenol or ibuprofen.  She  denies any abdominal pain currently or any perineal pain.  She is  pumping and  denies any breast tenderness.  The patient also denies any  extremity tenderness.  There are no genitourinary urinary or  gastrointestinal complaints.   PHYSICAL EXAMINATION:  Temperature is 98.2, pulse 106, blood pressure  132/83, weight 115.5 pounds.  GENERAL:  No apparent distress.  LUNGS:  Clear to auscultation bilaterally.  HEART:  Regular rate and rhythm.  ABDOMEN:  Soft, nontender.  Fundus was not able to be palpated secondary  to habitus.  EXTREMITIES:  No cyanosis, clubbing, or tenderness.  She has mild  bilateral symmetrical edema.  PELVIC:  Normal external female genitalia and resolved hematoma.  The  sutures that are on the inside of her right labium majus are visible and  there is some mild induration underneath the sutures, but no erythema  and no purulent drainage.  She does have some mild lochia.  BIMANUAL:  The patient has an tender uterus.   ASSESSMENT:  The patient is a 35-year gravida 1, para 1 with status post  vaginal delivery complicated by preeclampsia,  pulmonary edema, and  postpartum endometritis.  The patient is here today for followup and has  a new concern of persistent headaches with visual symptoms.  She also  reports continued that she had a fever of 102 on Sunday.  The fever  could have been as a result of her resolving endometritis.  However, the  patient does say that she is not still feeling well for further workup  for fever a will obtain.  We will obtain a CBC with differential and  obtain a urinalysis.  She was told to come in when she is having her  fevers as that would provide [name] with the best opportunity to do any  cultures to try to figure out if she has bacteriemia or any other  reasons for her fevers.  As far her headaches which is her primary  concern during this visit.  She does not seem to have any focal  neurologic symptoms, but given her history of preeclampsia and also  hypercoagulable state of pregnancy will obtain a CT scan of the  head  with contrast to rule out any etiologies for her persistent headache.  We will follow up on the results of the CT scan and manage accordingly.  For now, the patient was given a prescription for Fioricet to be used to  as needed for her headaches.  The patient was told to come to the MAU  for fevers do get worse, as her headaches get worse, or associated with  more symptoms or for any other concerns.  She will return to the clinic  in 1 week.  She was told to complete her antibiotic course and continue  taking labetalol and high iron for her postoperative anemia.  Of note,  the patient's last hemoglobin on July 09, 2009, was 6.7.  It is possible  that her headaches could be as a result of her anemia, we will follow up  her hemoglobin on today's CBC.           ______________________________  Jaynie Collins, MD     UA/MEDQ  D:  08/01/2009  T:  08/02/2009  Job:  [id number]

## 2011-05-13 NOTE — Group Therapy Note (Signed)
NAME:  Erica Lang, Erica Lang NO.:  [id number]   MEDICAL RECORD NO.:  [id number]          PATIENT TYPE:  WOC   LOCATION:  [facility]                   [facility]ACILITY:  [facility]   PHYSICIAN:  Argentina Donovan, MD        DATE OF BIRTH:  08/23/1976   DATE OF SERVICE:  07/25/2009                                  CLINIC NOTE   The patient is a 35 year old, primigravida female, who delivered 20 days  ago by normal vaginal delivery, ended up with a left labial hematoma,  which was drained satisfactorily, but she did have a significant blood  loss.  Shortly after that, she developed pulmonary edema, preeclampsia,  and it was treated with mag sulfate, diuretics, and placed in the  intensive care unit.  She was discharged on labetalol and Lasix and was  seen in the clinic last week, was sent up to the MAU because of high  fever of 102, and at that time treated for endometritis, although the  white count was normal.  The uterus was somewhat tender apparently and  the discharge looked a little purulent.  Today she comes in and says  that she is still sore in that area.  The lungs are clear.  The abdomen  is soft, flat, slightly tender in the suprapubic area, but without  guarding or rebound.  External genitalia reveals the sutures still in  place from the repair of the hematoma drainage.  The vagina, however, is  pale, somewhat atrophic, looking and with no significant discharge, and  no bleeding seen coming from the cervical os.  The uterus seems well,  insulated and firm, although slightly tender.  The patient is still on  cephalosporin as well as metronidazole.  She is almost finished with  those, I have encouraged her to finish those, increase her fluid intake,  come in for to check next week, make sure she is continuing to improve.  She did have a temperature 100 today when seen, but her temperatures at  home have rarely gone at high, usually run in the 99 plus and she has  been taking her  temperature 4 times a day.  Impression is postpartum  hematoma, treated and healing well and postpartum endometritis resolving  appropriately.  The patient still has anemia and she is on iron therapy.  She is taking labetalol for her postpartum hypertension and today her  blood pressure was 136/85.  She is pumping and dumping as far as nursing  is going.  Because of medication she is on, she will be finishing the  antibiotics soon and may be able to stop the labetalol next week if the  blood pressures continue to stay down.           ______________________________  Argentina Donovan, MD     PR/MEDQ  D:  07/25/2009  T:  07/26/2009  Job:  [id number]

## 2011-05-13 NOTE — Op Note (Signed)
NAME:  Erica Lang, Erica Lang NO.:  [id number]   MEDICAL RECORD NO.:  [id number]          PATIENT TYPE:  INP   LOCATION:  9374                          FACILITY:  [facility]   PHYSICIAN:  Scheryl Darter, MD       DATE OF BIRTH:  1976-09-29   DATE OF PROCEDURE:  07/05/2009  DATE OF DISCHARGE:                               OPERATIVE REPORT   PROCEDURE:  Incision and drainage of left vulvar hematoma, postpartum.   POSTOPERATIVE DIAGNOSIS:  Incision and drainage of left vulvar hematoma,  postpartum.   SURGEON:  Scheryl Darter, MD   ASSISTANT:  Odie Sera, DO   ESTIMATED BLOOD LOSS:  100 mL.   SPECIMENS:  None.   DRAINS:  Foley catheter.   COMPLICATIONS:  None.   COUNTS:  Correct.   OPERATIVE COURSE:  The patient gave written consent for incision and  drainage of left vulvar hematoma which developed postpartum from a  spontaneous vaginal delivery today.  The hematoma was expanding and was  quite symptomatic.  The patient identification was confirmed.  She was  brought to the OR and adequate epidural anesthesia was induced.  She was  placed in dorsal lithotomy position.  Exam revealed considerable amount  of swelling on the vulva with a mass superior to be about 8-9 cm  consistent with vulvar hematoma near the introitus.  Perineum and vagina  were sterilely prepped and draped.  Foley catheter was in place.  Further inspection revealed that there was a laceration of the vulva on  the inferior edge of the area of swelling that measured about 3 cm and  was about 1 cm deep.  This appeared to be immediately overlying the  hematoma and has elected to enter the hematoma of this area and hemostat  was used to enter the hematoma and defect was expanded, so that the  hematoma could be explored and clot to be expressed.  Hematoma tract  into the vagina.  There was an area overlying the hematoma cavity which  where the skin was quite attenuated just near the introitus.  It seemed  as  though it was through that area that the sutures could be placed to  obliterate the cavity and help with hemostasis.  A #15 blade was used to  make an incision about 3 cm long near the introitus and the area where  the skin was thinnest.  This allowed access to the area, where the  hematoma tract vaginally.  A 2-0 Vicryl sutures interrupted figure-of-  eights were placed to close the hematoma cavity.  The incision was  closed with interrupted figure-of-eights with 2-0 Vicryl.  The  laceration that was previously noted was closed with deep sutures with 2-  0 Vicryl followed by interrupted figure-of-eights to close  the skin.  There appeared to be good hemostasis at the end of the  procedure.  Estimated blood loss was about 100 mL.  The patient was  stable throughout the procedure.  She was brought in stable condition of  recovery room with a 1 L IV bag placed at the vulva to act  as a pressure  pack.      Scheryl Darter, MD  Electronically Signed     JA/MEDQ  D:  07/05/2009  T:  07/06/2009  Job:  [id number]

## 2011-05-13 NOTE — Op Note (Signed)
NAME:  Erica Lang, Erica Lang NO.:  [id number]   MEDICAL RECORD NO.:  [id number]          PATIENT TYPE:  INP   LOCATION:  9166                          FACILITY:  [facility]   PHYSICIAN:  Tilda Burrow, M.D. DATE OF BIRTH:  Jan 31, 1976   DATE OF PROCEDURE:  DATE OF DISCHARGE:                               OPERATIVE REPORT   DELIVERY TIME AND DATE:  3:12 a.m., July 05, 2009.   LABOR SUMMARY AND NOTE:  [name] was admitted on the evening of July 03, 2009, for headache with visual disturbance at 40 weeks 3/7th, with  elevated pressures and diagnosis of gestational hypertension and  headaches.  A Foley bulb cervical ripening was placed with cervix 1.5  cm, 80%, vertex, -2 and Foley was left in for 3-4 hours, then Pitocin  initiated.  Magnesium sulfate was being infused.  Group B strep  antibiotic prophylaxis was in place.  She progressed with steady uterine  with steady progress.  She was 4 cm, 50%, -2 at 11:00 a.m. on July 04, 2009, progressed slowly through the afternoon, 5 cm at 7:30, and  progressed more rapidly reaching 8 cm, completely effaced, 0 station  just after midnight.  Complete dilation was reached approximately 1:30  p.m. and she pushed for just under 2 hours and delivered from a direct  OA position an 8-pound 7-ounce female infant with delivery notable for a  right shoulder dystocia.  The vertex had delivered spontaneously from  left occiput anterior position and there was a loop of cord in front of  the fetal body that did not require reducing over the head.  The right  shoulder was checked for and was impacted beneath the symphysis pubis  and not accessible.  Legs were in McRoberts position.  The posterior  shoulder was already released and easily accessible in the posterior  vaginal vault.  My right index finger could be easily placed in the  axilla and as the patient pushed with legs in McRoberts position, we  were able to counterclockwise rotate the infant  to a transverse shoulder  position, whereupon the right shoulder released and the baby delivered  out spontaneously the remainder of the way.  The baby was initially  hypotonic, but responded to tactile stimulation and prompt transfer to  warmer for further suctioning oxygen and stimulation.  Apgars 7 and 8  assigned due to decreased tone.  The infant showed good grasp with both  hands though some initial decrease movement of the right shoulder.  This  appeared to be improving during the time immediately after delivery.   Mother's second degree lateral sulcus lacerations at 4 o'clock and 8  o'clock were noted and required repair using 3-0 Vicryl.  In addition,  she developed a 4-cm wide labia minora hematoma on the left side which  was not interfering with the urethra and was addressed by ice pack,  promptly placed upon completion of repair of second-degree lacerations.   ESTIMATED BLOOD LOSS:  750 mL.   Placenta had been delivered prior to episiotomy repair, Tomasa Blase  presentation, large placenta with lengthy  cord and three-vessel cord  confirmed.      Tilda Burrow, M.D.  Electronically Signed    JVF/MEDQ  D:  07/05/2009  T:  07/05/2009  Job:  [id number]

## 2011-05-16 NOTE — Discharge Summary (Signed)
NAME:  Erica Lang, Erica Lang NO.:  [id number]   MEDICAL RECORD NO.:  [id number]           PATIENT TYPE:   LOCATION:                                 FACILITY:   PHYSICIAN:  Scheryl Darter, MD       DATE OF BIRTH:  13-Dec-1976   DATE OF ADMISSION:  DATE OF DISCHARGE:                               DISCHARGE SUMMARY   REASON FOR HOSPITALIZATION:  The patient presented to the Maternity  Admissions Unit on July 03, 2009, with report of frontal headache and at  40 weeks and 3 days, her blood pressure at that time was 140/81.  The  patient was then admitted for induction of labor due to gestational  hypertension.   Pertinent Laboratory Data:  Uric acid was 4.3, AST 44, ALT 43.  Hemoglobin 10.1, platelets 180.  Rubella nonimmune, GBS positive.   FINAL DIAGNOSES:  1. Severe preeclampsia.  2. Normal spontaneous vaginal delivery.  3. Left labial hematoma.   SIGNIFICANT FINDINGS:  The patient delivered a female infant on July 05, 2009, weighing approximately 8 pounds 8 ounces with an estimated blood  loss of 750 mL and approximately 8 cm left labial hematoma.   PROCEDURES PERFORMED AND TREATMENTS RENDERED:  Upon admission on July 03, 2009, Foley bulb was placed within the cervix.  Cervix at that time was  1-2 cm, 80% effaced and -2 station.  After the Foley bulb was  introduced, Pitocin was used to augment labor with potential artificial  rupture of membranes, which a fetal scalp electrode and IUPC was placed.  The patient also received magnesium sulfate for seizure prophylaxis for  pain control.  The patient received Stadol and epidural.  On July 05, 2009, the patient had an I and D of the left vulvar hematoma.  The  patient received Depo-Provera prior to discharge.   CONDITION OF THE PATIENT ON DISCHARGE:  The patient was alert and  oriented, denied any symptoms of anemia including dizziness, chest pain,  shortness of breath, site of the I and D of the hematoma was healing  well.  Vital signs were stable, had blood pressures ranging from 110-  120s over 50s-70s with scant amount of lochia.  Instructions given to  the patient and family.   Instructions given to the patient and her family, the patient was  instructed to not lift anything greater than 10 pounds.  Prescription  for Percocet was given for pain.  Also, given Integra for the anemia 1  pill daily, Colace for bowels.  The patient was to receive followup care  by baby love nurse to assess vulvar site for healing and the patient was  sent to follow up in the clinic in 6 weeks, later at [facility].      Sid Falcon, CNM      Scheryl Darter, MD  Electronically Signed    WM/MEDQ  D:  08/21/2009  T:  08/22/2009  Job:  [id number]

## 2011-10-13 LAB — URINALYSIS, ROUTINE W REFLEX MICROSCOPIC
Glucose, UA: NEGATIVE
Protein, ur: NEGATIVE
Specific Gravity, Urine: 1.011

## 2011-10-13 LAB — WET PREP, GENITAL
Trich, Wet Prep: NONE SEEN
Yeast Wet Prep HPF POC: NONE SEEN

## 2011-10-13 LAB — URINE MICROSCOPIC-ADD ON

## 2011-10-13 LAB — COMPREHENSIVE METABOLIC PANEL
ALT: 19
AST: 15
Calcium: 9
GFR calc Af Amer: >60
Sodium: 138
Total Protein: 7

## 2011-10-13 LAB — POCT PREGNANCY, URINE: Operator id: 173591

## 2011-10-13 LAB — DIFFERENTIAL
Eosinophils Absolute: 0
Eosinophils Relative: 0
Lymphs Abs: 0.9
Monocytes Relative: 2 — ABNORMAL LOW
Neutrophils Relative %: 89 — ABNORMAL HIGH

## 2011-10-13 LAB — CBC
MCHC: 34.9
RDW: 13.7

## 2011-10-13 LAB — GC/CHLAMYDIA PROBE AMP, GENITAL: GC Probe Amp, Genital: NEGATIVE

## 2012-04-25 ENCOUNTER — Other Ambulatory Visit: Payer: Self-pay | Admitting: Obstetrics & Gynecology

## 2012-04-28 ENCOUNTER — Ambulatory Visit: Payer: Managed Care, Other (non HMO) | Admitting: Internal Medicine

## 2012-04-28 VITALS — BP 98/64 | HR 80 | Temp 99.1°F | Resp 24 | Ht 70.0 in | Wt 245.2 lb

## 2012-04-28 DIAGNOSIS — S60569A Insect bite (nonvenomous) of unspecified hand, initial encounter: Secondary | ICD-10-CM

## 2012-04-28 DIAGNOSIS — M79609 Pain in unspecified limb: Secondary | ICD-10-CM

## 2012-04-28 DIAGNOSIS — W57XXXA Bitten or stung by nonvenomous insect and other nonvenomous arthropods, initial encounter: Secondary | ICD-10-CM

## 2012-04-28 MED ORDER — EPINEPHRINE 0.3 MG/0.3ML IJ DEVI: 0.3000 mg | Freq: Once | INTRAMUSCULAR | Status: AC

## 2012-04-28 NOTE — Progress Notes (Signed)
  Subjective:    Patient ID: Erica Lang, female    DOB: 1976/01/24, 36 y.o.   MRN: [id number]  HPI  Erica Lang is a 36 year old AA female who was at work about 9:30 today when her left hand started to itch.  She used hand sanitizer and then noticed it was swelling.  She never saw an insect.  She last had a reaction to a bee sting at age 67 which required a trip to the ED for respiratory distress and swelling, since that time she has tried to keep an epi-pen but is currently without one, she has no PCP. She is currently experiencing pain in her left hand at the base of her thumb, no parathesias, no difficulty breathing, no lightheadedness.  Her husband brought her today but is in the lobby with their 37 year old.  Erica Lang is currently on her menses, not pregnant.    Review of Systems  All other systems reviewed and are negative.  Negative except as noted in HPI     Objective:   Physical Exam  Vitals reviewed. Constitutional: She appears well-developed and well-nourished.  HENT:  Head: Normocephalic.  Mouth/Throat: Oropharynx is clear and moist.  Eyes: Conjunctivae are normal.  Neck: Neck supple.  Cardiovascular: Normal rate, regular rhythm and normal heart sounds.   Pulmonary/Chest: Effort normal and breath sounds normal. She has no wheezes. She has no rales.  Abdominal: Soft.  Skin:      11:10:  Benadryl 50 mg and Zantac 150 mg given po X1. 11:40  BP 110/70, pulse 80.       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  Epi-pen prescribed for future use if needed.  AVS printed and given pt, she is to return to the clinic if symptoms worsen.  Advised to take Zyrtec, Zantac and Benadryl over the next 24 hours as needed.

## 2012-04-28 NOTE — Patient Instructions (Signed)
Take Zantac 150 mg twice daily and Zyrtec 10 mg daily, may also take Benadryl for itching every 4-6 hours as needed for 1-2 days.  Return to our clinic if you have any breathing problems or worrisome symptoms.   Insect Bite Mosquitoes, flies, fleas, bedbugs, and many other insects can bite. Insect bites are different from insect stings. A sting is when venom is injected into the skin. Some insect bites can transmit infectious diseases. SYMPTOMS  Insect bites usually turn red, swell, and itch for 2 to 4 days. They often go away on their own. TREATMENT  Your caregiver may prescribe antibiotic medicines if a bacterial infection develops in the bite. HOME CARE INSTRUCTIONS  Do not scratch the bite area.   Keep the bite area clean and dry. Wash the bite area thoroughly with soap and water.   Put ice or cool compresses on the bite area.   Put ice in a plastic bag.   Place a towel between your skin and the bag.   Leave the ice on for 20 minutes, 4 times a day for the first 2 to 3 days, or as directed.   You may apply a baking soda paste, cortisone cream, or calamine lotion to the bite area as directed by your caregiver. This can help reduce itching and swelling.   Only take over-the-counter or prescription medicines as directed by your caregiver.   If you are given antibiotics, take them as directed. Finish them even if you start to feel better.  You may need a tetanus shot if:  You cannot remember when you had your last tetanus shot.   You have never had a tetanus shot.   The injury broke your skin.  If you get a tetanus shot, your arm may swell, get red, and feel warm to the touch. This is common and not a problem. If you need a tetanus shot and you choose not to have one, there is a rare chance of getting tetanus. Sickness from tetanus can be serious. SEEK IMMEDIATE MEDICAL CARE IF:   You have increased pain, redness, or swelling in the bite area.   You see a red line on the skin  coming from the bite.   You have a fever.   You have joint pain.   You have a headache or neck pain.   You have unusual weakness.   You have a rash.   You have chest pain or shortness of breath.   You have abdominal pain, nausea, or vomiting.   You feel unusually tired or sleepy.  MAKE SURE YOU:   Understand these instructions.   Will watch your condition.   Will get help right away if you are not doing well or get worse.  Document Released: 01/22/2005 Document Revised: 12/04/2011 Document Reviewed: 07/16/2011 [facility] Patient Information 2012 [place], [place].

## 2012-12-08 ENCOUNTER — Ambulatory Visit: Payer: Managed Care, Other (non HMO) | Admitting: Family Medicine

## 2012-12-08 VITALS — BP 108/74 | HR 94 | Temp 98.4°F | Resp 19 | Wt 201.0 lb

## 2012-12-08 DIAGNOSIS — R059 Cough, unspecified: Secondary | ICD-10-CM

## 2012-12-08 DIAGNOSIS — R062 Wheezing: Secondary | ICD-10-CM

## 2012-12-08 DIAGNOSIS — J019 Acute sinusitis, unspecified: Secondary | ICD-10-CM

## 2012-12-08 DIAGNOSIS — R05 Cough: Secondary | ICD-10-CM

## 2012-12-08 MED ORDER — HYDROCODONE-HOMATROPINE 5-1.5 MG/5ML PO SYRP: 5.0000 mL | ORAL_SOLUTION | Freq: Every evening | ORAL | Status: DC | PRN

## 2012-12-08 MED ORDER — ALBUTEROL SULFATE HFA 108 (90 BASE) MCG/ACT IN AERS: 2.0000 | INHALATION_SPRAY | Freq: Four times a day (QID) | RESPIRATORY_TRACT | Status: AC | PRN

## 2012-12-08 MED ORDER — AMOXICILLIN 875 MG PO TABS: 875.0000 mg | ORAL_TABLET | Freq: Two times a day (BID) | ORAL | Status: DC

## 2012-12-08 MED ORDER — BENZONATATE 200 MG PO CAPS: 200.0000 mg | ORAL_CAPSULE | Freq: Two times a day (BID) | ORAL | Status: DC | PRN

## 2012-12-08 NOTE — Progress Notes (Signed)
[facility]:  Office Visit  Chief Complaint:  Chief Complaint  Patient presents with  . Fever  . Headache  . URI    HPI: Erica Lang is a 36 y.o. female who complains of  5 day history of feeling poorly. Frontal headache, coughing up yellow, green sputum. Tmax 103, fever broke this morning 98.7.  Some facial pain. Tried OTC meds without relief. Denies h/o asthma or allergies. . 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 Chills. + CP, + SOB/wheeze with coughing fits.   Past Medical History  Diagnosis Date  . Anemia   . Anxiety    Past Surgical History  Procedure Date  . Vulva surgery   . Pelvic laparoscopy    History   Social History  . Marital Status: Married    Spouse Name: N/A    Number of Children: N/A  . Years of Education: N/A   Social History Main Topics  . Smoking status: Never Smoker   . Smokeless tobacco: Never Used  . Alcohol Use: No  . Drug Use: No  . Sexually Active: Yes   Other Topics Concern  . None   Social History Narrative  . None   Family History  Problem Relation Age of Onset  . Stroke Mother    Allergies  Allergen Reactions  . Bee Venom Anaphylaxis   Prior to Admission medications   Medication Sig Start Date End Date Taking? Authorizing Provider  B Complex Vitamins (B-COMPLEX/B-12 SL) Place 5,000 mcg under the tongue.   Yes Historical Provider, MD  EPINEPHrine (EPI-PEN) 0.3 mg/0.3 mL DEVI Inject 0.3 mLs (0.3 mg total) into the muscle once. 04/28/12  Yes Rickard Patience, PA-C  fish oil-omega-3 fatty acids 1000 MG capsule Take 1 g by mouth daily.   Yes Historical Provider, MD  Multiple Vitamins-Minerals (MULTIPLE VITAMINS/WOMENS) tablet Take 1 tablet by mouth daily.   Yes Historical Provider, MD     ROS: The patient denies night sweats, unintentional weight loss, chest pain, palpitations, dyspnea on exertion, nausea, vomiting, abdominal pain, dysuria, hematuria, melena, numbness, weakness,  or tingling.   All other systems have been reviewed and were otherwise negative with the exception of those mentioned in the HPI and as above.    PHYSICAL EXAM: Filed Vitals:   12/08/12 1504  BP: 108/74  Pulse: 94  Temp: 98.4 F (36.9 C)  Resp: 19   Filed Vitals:   12/08/12 1504  Weight: 201 lb (91.173 kg)   There is no height on file to calculate BMI.  General: Alert, no acute distress HEENT:  Normocephalic, atraumatic, oropharynx patent. TM nl, slightly erythematous throat. + sinus tenderenss Cardiovascular:  Regular rate and rhythm, no rubs murmurs or gallops.  No Carotid bruits, radial pulse intact. No pedal edema.  Respiratory: Clear to auscultation bilaterally.  No wheezes, rales, or rhonchi.  No cyanosis, no use of accessory musculature GI: No organomegaly, abdomen is soft and non-tender, positive bowel sounds.  No masses. Skin: No rashes. Neurologic: Facial musculature symmetric. Psychiatric: Patient is appropriate throughout our interaction. Lymphatic: No cervical lymphadenopathy Musculoskeletal: Gait intact.   LABS: No results found for this or any previous visit.   EKG/XRAY:   Primary read interpreted by Dr. Conley Rolls at [facility].   ASSESSMENT/PLAN: Encounter Diagnoses  Name Primary?  . Wheezing Yes  . Cough   . Acute sinusitis     Flu like sxs, with possible secondary acute sinusitis. H/o asthma ( last attack  6 yrs ago) Rx Amoxacillin, Hydromet, Tessalon Perles, Albuterol Note to be off for today through weekend. Return to work on Monday.    Hamilton Capri PHUONG, DO 12/08/2012 3:44 PM

## 2012-12-29 NOTE — L&D Delivery Note (Signed)
Delivery Note At 5:04 PM a viable and healthy female was delivered via  (Presentation: ROA ).  APGAR: 8, 9; weight pending.   Placenta status: spontaneous, intact.  Cord:  with the following complications: none.  Cord pH: na  Anesthesia:  epidural Episiotomy: none Lacerations: right periurethral Suture Repair: 3.0 vicryl rapide Est. Blood Loss (mL): 200  Mom to postpartum.  Baby to nursery-stable.  Zyquan Crotty J 10/13/2013, 5:20 PM

## 2013-04-20 LAB — OB RESULTS CONSOLE HIV ANTIBODY (ROUTINE TESTING): HIV: NONREACTIVE

## 2013-04-20 LAB — OB RESULTS CONSOLE RPR: RPR: NONREACTIVE

## 2013-04-20 LAB — OB RESULTS CONSOLE ABO/RH

## 2013-04-20 LAB — OB RESULTS CONSOLE GC/CHLAMYDIA
Chlamydia: NEGATIVE
Gonorrhea: NEGATIVE

## 2013-06-24 ENCOUNTER — Ambulatory Visit (INDEPENDENT_AMBULATORY_CARE_PROVIDER_SITE_OTHER): Payer: Managed Care, Other (non HMO) | Admitting: Family Medicine

## 2013-06-24 VITALS — BP 112/63 | HR 91 | Temp 97.9°F | Resp 18 | Wt 243.0 lb

## 2013-06-24 DIAGNOSIS — M79672 Pain in left foot: Secondary | ICD-10-CM

## 2013-06-24 DIAGNOSIS — M79609 Pain in unspecified limb: Secondary | ICD-10-CM

## 2013-06-24 DIAGNOSIS — S91312A Laceration without foreign body, left foot, initial encounter: Secondary | ICD-10-CM

## 2013-06-24 DIAGNOSIS — S91309A Unspecified open wound, unspecified foot, initial encounter: Secondary | ICD-10-CM

## 2013-06-24 NOTE — Patient Instructions (Signed)
Return for recheck and suture removal as directed.  Take ibuprofen 600 mg (3  X  200mg ) every 6 hours as needed for pain or Tylenol 500 mg 2 every 6 hours.  Stay off foot as possible through the weekend.

## 2013-06-24 NOTE — Progress Notes (Signed)
Patient ID: CORDELL COKE MRN: [id number], DOB: 06/13/1976, 37 y.o. Date of Encounter: 06/24/2013, 1:23 PM   PROCEDURE NOTE: Verbal consent obtained. Sterile technique employed. Numbing: Anesthesia obtained with 1% lidocaine with epinephrine.   Cleansed with soap and water. Irrigated.  Wound explored, no deep structures involved, no foreign bodies.   Wound repaired with # 4 HM sutures and #2 SI sutures.  Hemostasis obtained. Wound cleansed and dressed.  Wound care instructions including precautions covered with patient. Handout given.  Anticipate suture removal in 10 days  Rhoderick Moody, PA-C 06/24/2013 1:23 PM

## 2013-06-24 NOTE — Progress Notes (Signed)
Subjective: Patient sliced the top of her foot just proximal to the left great toe when she was moving a chair. It was cut by a metal age. Her last tetanus shot was about 4 1/2 years ago. She is basically healthy.  Objective: 3.5 cm laceration across the top of the foot just proximal to the MTP joint of the large toe. She can flex and extend the toe. She does hurt.  Assessment: Foot pain Laceration foot  Plan: This is been little numbed up and explored to make sure there is no tendon involvement before repair.  After it was anesthetized we explored it and it looks like she just cut the soft tissue. No tendons were visible. Simple repair should be sufficient.

## 2013-06-25 ENCOUNTER — Telehealth: Payer: Self-pay

## 2013-06-25 NOTE — Telephone Encounter (Signed)
Patient was seen yesterday for a cut on her foot and received stiches. Patient states that she is in extreme pain and would to be given a pain med. Patient uses [facility] on [address]. Best contact number: [phone]

## 2013-06-25 NOTE — Telephone Encounter (Signed)
Patient states that her OBGYN called in some pain medication for and she does not need [name] to do it now.

## 2013-06-26 NOTE — Telephone Encounter (Signed)
Per Rose Dr. Alwyn Ren advised pt to come back in because pt should not need hydrocodone for her type of laceration and to try Tylenol.

## 2013-07-04 ENCOUNTER — Ambulatory Visit (INDEPENDENT_AMBULATORY_CARE_PROVIDER_SITE_OTHER): Payer: Managed Care, Other (non HMO) | Admitting: Physician Assistant

## 2013-07-04 VITALS — BP 124/74 | HR 97 | Temp 98.0°F | Resp 17 | Ht 70.0 in | Wt 250.0 lb

## 2013-07-04 DIAGNOSIS — Z5189 Encounter for other specified aftercare: Secondary | ICD-10-CM

## 2013-07-04 DIAGNOSIS — S91109D Unspecified open wound of unspecified toe(s) without damage to nail, subsequent encounter: Secondary | ICD-10-CM

## 2013-07-04 NOTE — Progress Notes (Signed)
  Subjective:    Patient ID: Erica Lang, female    DOB: 07/25/1976, 37 y.o.   MRN: [id number]  HPI  37 year old female presents for suture removal.  DOI 06/24/13.  Doing well without any issues or complaints.  Denies any erythema, warmth, or drainage. Admits her toe is still swollen and sore. Continues to wear post-op shoe.  She has no other concerns today.      Review of Systems  Constitutional: Negative for fever and chills.  Gastrointestinal: Negative for nausea and vomiting.  Skin: Positive for wound. Negative for color change.       Objective:   Physical Exam  Constitutional: She is oriented to person, place, and time. She appears well-developed and well-nourished.  HENT:  Head: Normocephalic.  Eyes: Conjunctivae are normal.  Neurological: She is alert and oriented to person, place, and time.  Skin:  Wound at base of left great toe. Well healing without erythema, warmth, or drainage. 5/5 strength.   Psychiatric: She has a normal mood and affect. Her behavior is normal. Judgment and thought content normal.    #6 sutures removed without difficulty. Patient tolerated well.       Assessment & Plan:  Wound, open, toe, subsequent encounter  Sutures removed.   May continue to wear post-op shoe for comfort if needed Keep covered for 2-3 more days Follow up as needed.

## 2013-10-10 ENCOUNTER — Telehealth (HOSPITAL_COMMUNITY): Payer: Self-pay | Admitting: *Deleted

## 2013-10-10 ENCOUNTER — Encounter (HOSPITAL_COMMUNITY): Payer: Self-pay | Admitting: *Deleted

## 2013-10-10 NOTE — Telephone Encounter (Signed)
Preadmission screen  

## 2013-10-11 ENCOUNTER — Telehealth (HOSPITAL_COMMUNITY): Payer: Self-pay | Admitting: *Deleted

## 2013-10-11 ENCOUNTER — Encounter (HOSPITAL_COMMUNITY): Payer: Self-pay | Admitting: *Deleted

## 2013-10-11 NOTE — Telephone Encounter (Signed)
Preadmission screen  

## 2013-10-12 ENCOUNTER — Other Ambulatory Visit: Payer: Self-pay | Admitting: Obstetrics and Gynecology

## 2013-10-13 ENCOUNTER — Inpatient Hospital Stay (HOSPITAL_COMMUNITY): Payer: Managed Care, Other (non HMO) | Admitting: Anesthesiology

## 2013-10-13 ENCOUNTER — Encounter (HOSPITAL_COMMUNITY): Payer: Managed Care, Other (non HMO) | Admitting: Anesthesiology

## 2013-10-13 ENCOUNTER — Inpatient Hospital Stay (HOSPITAL_COMMUNITY)
Admission: RE | Admit: 2013-10-13 | Discharge: 2013-10-15 | DRG: 774 | Disposition: A | Discharge status: Home / Self Care | Payer: Managed Care, Other (non HMO) | Source: Ambulatory Visit | Attending: Obstetrics and Gynecology | Admitting: Obstetrics and Gynecology

## 2013-10-13 ENCOUNTER — Encounter (HOSPITAL_COMMUNITY): Payer: Self-pay

## 2013-10-13 DIAGNOSIS — O1002 Pre-existing essential hypertension complicating childbirth: Principal | ICD-10-CM | POA: Diagnosis present

## 2013-10-13 DIAGNOSIS — Z22330 Carrier of Group B streptococcus: Secondary | ICD-10-CM

## 2013-10-13 DIAGNOSIS — O09529 Supervision of elderly multigravida, unspecified trimester: Secondary | ICD-10-CM | POA: Diagnosis present

## 2013-10-13 DIAGNOSIS — O99892 Other specified diseases and conditions complicating childbirth: Secondary | ICD-10-CM | POA: Diagnosis present

## 2013-10-13 LAB — TYPE AND SCREEN: Antibody Screen: NEGATIVE

## 2013-10-13 LAB — LACTATE DEHYDROGENASE: LDH: 163 U/L (ref 94–250)

## 2013-10-13 LAB — COMPREHENSIVE METABOLIC PANEL
ALT: 22 U/L (ref 0–35)
AST: 25 U/L (ref 0–37)
Albumin: 2.9 g/dL — ABNORMAL LOW (ref 3.5–5.2)
Alkaline Phosphatase: 156 U/L — ABNORMAL HIGH (ref 39–117)
CO2: 19 mEq/L (ref 19–32)
Chloride: 105 mEq/L (ref 96–112)
Creatinine, Ser: 0.57 mg/dL (ref 0.50–1.10)
Potassium: 3.9 mEq/L (ref 3.5–5.1)
Sodium: 135 mEq/L (ref 135–145)
Total Bilirubin: 0.4 mg/dL (ref 0.3–1.2)
Total Protein: 6.6 g/dL (ref 6.0–8.3)

## 2013-10-13 LAB — CBC
HCT: 33.5 % — ABNORMAL LOW (ref 36.0–46.0)
Hemoglobin: 11.7 g/dL — ABNORMAL LOW (ref 12.0–15.0)
Hemoglobin: 11.6 g/dL — ABNORMAL LOW (ref 12.0–15.0)
MCHC: 34.7 g/dL (ref 30.0–36.0)
MCHC: 34.6 g/dL (ref 30.0–36.0)
MCV: 93.1 fL (ref 78.0–100.0)
Platelets: 174 10*3/uL (ref 150–400)
Platelets: 164 10*3/uL (ref 150–400)
RBC: 3.62 MIL/uL — ABNORMAL LOW (ref 3.87–5.11)
RBC: 3.58 MIL/uL — ABNORMAL LOW (ref 3.87–5.11)
WBC: 9.4 10*3/uL (ref 4.0–10.5)
WBC: 13.6 10*3/uL — ABNORMAL HIGH (ref 4.0–10.5)

## 2013-10-13 LAB — URIC ACID: Uric Acid, Serum: 6 mg/dL (ref 2.4–7.0)

## 2013-10-13 LAB — RPR: RPR Ser Ql: NONREACTIVE

## 2013-10-13 MED ORDER — ZOLPIDEM TARTRATE 5 MG PO TABS: 5.0000 mg | ORAL_TABLET | Freq: Every evening | ORAL | Status: DC | PRN

## 2013-10-13 MED ORDER — LIDOCAINE HCL (PF) 1 % IJ SOLN
INTRAMUSCULAR | Status: DC | PRN
  Administered 2013-10-13 (×4): 4 mL

## 2013-10-13 MED ORDER — METHYLERGONOVINE MALEATE 0.2 MG/ML IJ SOLN: 0.2000 mg | INTRAMUSCULAR | Status: DC | PRN

## 2013-10-13 MED ORDER — IBUPROFEN 600 MG PO TABS
600.0000 mg | ORAL_TABLET | Freq: Four times a day (QID) | ORAL | Status: DC | PRN
  Administered 2013-10-13: 600 mg via ORAL
  Filled 2013-10-13: qty 1

## 2013-10-13 MED ORDER — DEXTROSE 5 % IV SOLN
2.5000 10*6.[IU] | INTRAVENOUS | Status: DC
  Administered 2013-10-13: 2.5 10*6.[IU] via INTRAVENOUS
  Filled 2013-10-13 (×5): qty 2.5

## 2013-10-13 MED ORDER — IBUPROFEN 600 MG PO TABS
600.0000 mg | ORAL_TABLET | Freq: Four times a day (QID) | ORAL | Status: DC
  Administered 2013-10-14 – 2013-10-15 (×6): 600 mg via ORAL
  Filled 2013-10-13 (×6): qty 1

## 2013-10-13 MED ORDER — OXYCODONE-ACETAMINOPHEN 5-325 MG PO TABS
1.0000 | ORAL_TABLET | ORAL | Status: DC | PRN
  Administered 2013-10-13 – 2013-10-14 (×3): 1 via ORAL
  Administered 2013-10-14 – 2013-10-15 (×2): 2 via ORAL
  Filled 2013-10-13: qty 1
  Filled 2013-10-13: qty 2
  Filled 2013-10-13: qty 1
  Filled 2013-10-13: qty 2
  Filled 2013-10-13: qty 1

## 2013-10-13 MED ORDER — EPHEDRINE 5 MG/ML INJ
10.0000 mg | INTRAVENOUS | Status: DC | PRN
  Filled 2013-10-13: qty 4
  Filled 2013-10-13: qty 2

## 2013-10-13 MED ORDER — PENICILLIN G POTASSIUM 5000000 UNITS IJ SOLR: 5.0000 10*6.[IU] | Freq: Once | INTRAVENOUS | Status: DC

## 2013-10-13 MED ORDER — CITRIC ACID-SODIUM CITRATE 334-500 MG/5ML PO SOLN: 30.0000 mL | ORAL | Status: DC | PRN

## 2013-10-13 MED ORDER — BUTORPHANOL TARTRATE 1 MG/ML IJ SOLN
INTRAMUSCULAR | Status: AC
  Filled 2013-10-13: qty 1

## 2013-10-13 MED ORDER — BUTORPHANOL TARTRATE 1 MG/ML IJ SOLN
1.0000 mg | Freq: Once | INTRAMUSCULAR | Status: AC
  Administered 2013-10-13: 1 mg via INTRAVENOUS

## 2013-10-13 MED ORDER — SENNOSIDES-DOCUSATE SODIUM 8.6-50 MG PO TABS
2.0000 | ORAL_TABLET | ORAL | Status: DC
  Administered 2013-10-14 – 2013-10-15 (×2): 2 via ORAL
  Filled 2013-10-13 (×2): qty 2

## 2013-10-13 MED ORDER — SIMETHICONE 80 MG PO CHEW: 80.0000 mg | CHEWABLE_TABLET | ORAL | Status: DC | PRN

## 2013-10-13 MED ORDER — OXYTOCIN 40 UNITS IN LACTATED RINGERS INFUSION - SIMPLE MED: 62.5000 mL/h | INTRAVENOUS | Status: DC

## 2013-10-13 MED ORDER — DIPHENHYDRAMINE HCL 25 MG PO CAPS: 25.0000 mg | ORAL_CAPSULE | Freq: Four times a day (QID) | ORAL | Status: DC | PRN

## 2013-10-13 MED ORDER — DIBUCAINE 1 % RE OINT: 1.0000 "application " | TOPICAL_OINTMENT | RECTAL | Status: DC | PRN

## 2013-10-13 MED ORDER — ONDANSETRON HCL 4 MG/2ML IJ SOLN
4.0000 mg | Freq: Four times a day (QID) | INTRAMUSCULAR | Status: DC | PRN
  Administered 2013-10-13: 4 mg via INTRAVENOUS
  Filled 2013-10-13: qty 2

## 2013-10-13 MED ORDER — LANOLIN HYDROUS EX OINT: TOPICAL_OINTMENT | CUTANEOUS | Status: DC | PRN

## 2013-10-13 MED ORDER — FENTANYL 2.5 MCG/ML BUPIVACAINE 1/10 % EPIDURAL INFUSION (WH - ANES)
14.0000 mL/h | INTRAMUSCULAR | Status: DC | PRN
  Administered 2013-10-13: 14 mL/h via EPIDURAL
  Filled 2013-10-13: qty 125

## 2013-10-13 MED ORDER — LIDOCAINE HCL (PF) 1 % IJ SOLN
30.0000 mL | INTRAMUSCULAR | Status: DC | PRN
  Filled 2013-10-13: qty 30

## 2013-10-13 MED ORDER — ACETAMINOPHEN 325 MG PO TABS: 650.0000 mg | ORAL_TABLET | ORAL | Status: DC | PRN

## 2013-10-13 MED ORDER — PENICILLIN G POTASSIUM 5000000 UNITS IJ SOLR
5.0000 10*6.[IU] | Freq: Once | INTRAVENOUS | Status: AC
  Administered 2013-10-13: 5 10*6.[IU] via INTRAVENOUS
  Filled 2013-10-13: qty 5

## 2013-10-13 MED ORDER — LACTATED RINGERS IV SOLN
INTRAVENOUS | Status: DC
  Administered 2013-10-13: 08:00:00 via INTRAVENOUS

## 2013-10-13 MED ORDER — LACTATED RINGERS IV SOLN: 500.0000 mL | INTRAVENOUS | Status: DC | PRN

## 2013-10-13 MED ORDER — PHENYLEPHRINE 40 MCG/ML (10ML) SYRINGE FOR IV PUSH (FOR BLOOD PRESSURE SUPPORT)
80.0000 ug | PREFILLED_SYRINGE | INTRAVENOUS | Status: DC | PRN
  Filled 2013-10-13: qty 2

## 2013-10-13 MED ORDER — OXYTOCIN 40 UNITS IN LACTATED RINGERS INFUSION - SIMPLE MED
1.0000 m[IU]/min | INTRAVENOUS | Status: DC
  Administered 2013-10-13: 6 m[IU]/min via INTRAVENOUS
  Administered 2013-10-13: 2 m[IU]/min via INTRAVENOUS
  Filled 2013-10-13: qty 1000

## 2013-10-13 MED ORDER — METHYLERGONOVINE MALEATE 0.2 MG PO TABS: 0.2000 mg | ORAL_TABLET | ORAL | Status: DC | PRN

## 2013-10-13 MED ORDER — PRENATAL MULTIVITAMIN CH
1.0000 | ORAL_TABLET | Freq: Every day | ORAL | Status: DC
  Administered 2013-10-14: 1 via ORAL
  Filled 2013-10-13: qty 1

## 2013-10-13 MED ORDER — TERBUTALINE SULFATE 1 MG/ML IJ SOLN: 0.2500 mg | Freq: Once | INTRAMUSCULAR | Status: DC | PRN

## 2013-10-13 MED ORDER — OXYCODONE-ACETAMINOPHEN 5-325 MG PO TABS: 1.0000 | ORAL_TABLET | ORAL | Status: DC | PRN

## 2013-10-13 MED ORDER — ONDANSETRON HCL 4 MG/2ML IJ SOLN: 4.0000 mg | INTRAMUSCULAR | Status: DC | PRN

## 2013-10-13 MED ORDER — OXYTOCIN BOLUS FROM INFUSION: 500.0000 mL | INTRAVENOUS | Status: DC

## 2013-10-13 MED ORDER — PHENYLEPHRINE 40 MCG/ML (10ML) SYRINGE FOR IV PUSH (FOR BLOOD PRESSURE SUPPORT)
80.0000 ug | PREFILLED_SYRINGE | INTRAVENOUS | Status: DC | PRN
  Filled 2013-10-13: qty 5
  Filled 2013-10-13: qty 2

## 2013-10-13 MED ORDER — LACTATED RINGERS IV SOLN
500.0000 mL | Freq: Once | INTRAVENOUS | Status: AC
  Administered 2013-10-13: 500 mL via INTRAVENOUS

## 2013-10-13 MED ORDER — FLEET ENEMA 7-19 GM/118ML RE ENEM: 1.0000 | ENEMA | RECTAL | Status: DC | PRN

## 2013-10-13 MED ORDER — BENZOCAINE-MENTHOL 20-0.5 % EX AERO
1.0000 "application " | INHALATION_SPRAY | CUTANEOUS | Status: DC | PRN
  Administered 2013-10-13: 1 via TOPICAL
  Filled 2013-10-13: qty 56

## 2013-10-13 MED ORDER — DIPHENHYDRAMINE HCL 50 MG/ML IJ SOLN: 12.5000 mg | INTRAMUSCULAR | Status: DC | PRN

## 2013-10-13 MED ORDER — TETANUS-DIPHTH-ACELL PERTUSSIS 5-2.5-18.5 LF-MCG/0.5 IM SUSP: 0.5000 mL | Freq: Once | INTRAMUSCULAR | Status: DC

## 2013-10-13 MED ORDER — ONDANSETRON HCL 4 MG PO TABS: 4.0000 mg | ORAL_TABLET | ORAL | Status: DC | PRN

## 2013-10-13 MED ORDER — DEXTROSE 5 % IV SOLN: 2.5000 10*6.[IU] | INTRAVENOUS | Status: DC

## 2013-10-13 MED ORDER — WITCH HAZEL-GLYCERIN EX PADS: 1.0000 "application " | MEDICATED_PAD | CUTANEOUS | Status: DC | PRN

## 2013-10-13 NOTE — Progress Notes (Signed)
Erica Lang is a 37 y.o. G3P1011 at [redacted]w[redacted]d by LMP admitted for induction of labor due to Hypertension.  Subjective: Comfortable. No s/s PEC  Objective: LMP 01/13/2013      FHT:  FHR: 145 bpm, variability: moderate,  accelerations:  Present,  decelerations:  Absent and   UC:   irregular, every 10 minutes SVE:    3/60/-2 aROM- clear  Labs: No results found for this basename: WBC, HGB, HCT, MCV, PLT    Assessment / Plan: Induction of labor due to [facility],  progressing well on pitocin  Labor: Progressing on Pitocin, will continue to increase then AROM Preeclampsia:  no signs or symptoms of toxicity, intake and ouput balanced and labs stable Fetal Wellbeing:  Category I Pain Control:  Labor support without medications I/D:  n/a Anticipated MOD:  NSVD  Erica Lang 10/13/2013, 6:56 AM

## 2013-10-13 NOTE — Anesthesia Procedure Notes (Signed)
Epidural Patient location during procedure: OB Start time: 10/13/2013 1:43 PM  Staffing Performed by: anesthesiologist   Preanesthetic Checklist Completed: patient identified, site marked, surgical consent, pre-op evaluation, timeout performed, IV checked, risks and benefits discussed and monitors and equipment checked  Epidural Patient position: sitting Prep: site prepped and draped and DuraPrep Patient monitoring: continuous pulse ox and blood pressure Approach: midline Injection technique: LOR air  Needle:  Needle type: Tuohy  Needle gauge: 17 G Needle length: 9 cm and 9 Needle insertion depth: 7 cm Catheter type: closed end flexible Catheter size: 19 Gauge Catheter at skin depth: 12 cm Test dose: negative  Assessment Events: blood not aspirated, injection not painful, no injection resistance, negative IV test and paresthesia (left leg transient)  Additional Notes Discussed risk of headache, infection, bleeding, nerve injury and failed or incomplete block.  Patient voices understanding and wishes to proceed.  Epidural placed easily on first attempt.  Transient left leg paresthesia.  Patient tolerated procedure well with no apparent complications.  Jasmine December, MDReason for block:procedure for pain

## 2013-10-13 NOTE — Progress Notes (Signed)
Erica Lang is a 37 y.o. G3P1011 at [redacted]w[redacted]d by LMP admitted for induction of labor due to Hypertension and history of PEC.  Subjective: Feels pressure  Objective: BP 134/77  Pulse 79  Temp(Src) 98.4 F (36.9 C) (Oral)  Resp 20  Ht 5\' 9"  (1.753 m)  Wt 120.203 kg (265 lb)  BMI 39.12 kg/m2  SpO2 100%  LMP 01/13/2013      FHT:  FHR: 120 bpm, variability: moderate,  accelerations:  Present,  decelerations:  Absent UC:   regular, every 3 minutes SVE:   Dilation: 8.5 Effacement (%): 90 Station: -1;0 Exam by:: Dr Billy Coast  Labs: Lab Results  Component Value Date   WBC 9.4 10/13/2013   HGB 11.7* 10/13/2013   HCT 33.7* 10/13/2013   MCV 93.1 10/13/2013   PLT 174 10/13/2013    Assessment / Plan: Induction of labor due to [facility],  progressing well on pitocin History of Severe PEC  Labor: Progressing normally Preeclampsia:  no signs or symptoms of toxicity, intake and ouput balanced and labs stable Fetal Wellbeing:  Category I Pain Control:  Epidural I/D:  n/a Anticipated MOD:  NSVD  Erica Lang 10/13/2013, 3:01 PM

## 2013-10-13 NOTE — Anesthesia Preprocedure Evaluation (Signed)
Anesthesia Evaluation  Patient identified by MRN, date of birth, ID band Patient awake    Reviewed: Allergy & Precautions, H&P , NPO status , Patient's Chart, lab work & pertinent test results, reviewed documented beta blocker date and time   History of Anesthesia Complications Negative for: history of anesthetic complications  Airway Mallampati: III TM Distance: >3 FB Neck ROM: full    Dental  (+) Teeth Intact   Pulmonary asthma (last inhaler use one month ago) ,  breath sounds clear to auscultation        Cardiovascular hypertension, Rhythm:regular Rate:Normal     Neuro/Psych negative neurological ROS  negative psych ROS   GI/Hepatic negative GI ROS, Neg liver ROS,   Endo/Other  Morbid obesityPCOS  Renal/GU negative Renal ROS     Musculoskeletal   Abdominal   Peds  Hematology negative hematology ROS (+)   Anesthesia Other Findings   Reproductive/Obstetrics (+) Pregnancy                           Anesthesia Physical Anesthesia Plan  ASA: III  Anesthesia Plan: Epidural   Post-op Pain Management:    Induction:   Airway Management Planned:   Additional Equipment:   Intra-op Plan:   Post-operative Plan:   Informed Consent: I have reviewed the patients History and Physical, chart, labs and discussed the procedure including the risks, benefits and alternatives for the proposed anesthesia with the patient or authorized representative who has indicated his/her understanding and acceptance.     Plan Discussed with:   Anesthesia Plan Comments:         Anesthesia Quick Evaluation

## 2013-10-13 NOTE — H&P (Signed)
Erica Lang is a 37 y.o. female presenting for induction for CHTN. Maternal Medical History:  Fetal activity: Perceived fetal activity is normal.    Prenatal complications: PIH.   Prenatal Complications - Diabetes: none.    OB History   Grav Para Term Preterm Abortions TAB SAB Ect Mult Living   3 1 1  1  1   1      Past Medical History  Diagnosis Date  . Anemia   . Anxiety   . Allergy   . Hypertension   . Abnormal Pap smear   . Hidradenitis   . Obesity   . PCOS (polycystic ovarian syndrome)   . Asthma   . 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 Hx of varicella    Past Surgical History  Procedure Laterality Date  . Vulva surgery    . Pelvic laparoscopy     Family History: family history includes Deep vein thrombosis in her mother; Diabetes in her mother; Heart disease in her mother; Hypertension in her mother; Stroke in her mother. Social History:  reports that she has never smoked. She has never used smokeless tobacco. She reports that she does not drink alcohol or use illicit drugs.   Prenatal Transfer Tool  Maternal Diabetes: No Genetic Screening: Normal Maternal Ultrasounds/Referrals: Normal Fetal Ultrasounds or other Referrals:  None Maternal Substance Abuse:  No Significant Maternal Medications:  Meds include: Other:  Significant Maternal Lab Results:  None Other Comments:  GBS positive, CHTN  Review of Systems  Constitutional: Negative.   HENT: Negative.   Eyes: Negative.   Respiratory: Negative.   Cardiovascular: Negative.   Gastrointestinal: Negative.   Genitourinary: Negative.   Musculoskeletal: Negative.   Skin: Negative.   Neurological: Negative.   Endo/Heme/Allergies: Negative.   Psychiatric/Behavioral: Negative.     Dilation: 8.5 Effacement (%): 90 Station: -1;0 Exam by:: Dr Billy Coast Blood pressure 134/77, pulse 79, temperature 98.6 F (37 C), temperature source Oral, resp. rate 20, height 5\' 9"  (1.753 m), weight 120.203 kg (265  lb), last menstrual period 01/13/2013, SpO2 100.00%. Exam Physical Exam  Constitutional: She is oriented to person, place, and time. She appears well-developed and well-nourished.  HENT:  Head: Normocephalic.  Eyes: Pupils are equal, round, and reactive to light.  Neck: Normal range of motion.  Cardiovascular: Normal rate.   Respiratory: Breath sounds normal.  GI: Soft. Bowel sounds are normal.  Genitourinary: Vagina normal and uterus normal.  Musculoskeletal: Normal range of motion.  Neurological: She is alert and oriented to person, place, and time.  Skin: Skin is warm.  Psychiatric: She has a normal mood and affect.    Prenatal labs: ABO, Rh: --/--/O POS, O POS (10/16 0745) Antibody: NEG (10/16 0745) Rubella: Immune (04/23 0000) RPR: NON REACTIVE (10/16 0745)  HBsAg: Negative (04/23 0000)  HIV: Non-reactive (04/23 0000)  GBS: Positive (04/23 0000)   Assessment/Plan: CHTN for induction Pitocin   Erica Lang 10/13/2013, 3:03 PM

## 2013-10-14 LAB — CBC
HCT: 29.8 % — ABNORMAL LOW (ref 36.0–46.0)
Hemoglobin: 10.4 g/dL — ABNORMAL LOW (ref 12.0–15.0)
MCH: 32.7 pg (ref 26.0–34.0)
MCV: 93.7 fL (ref 78.0–100.0)
Platelets: 153 10*3/uL (ref 150–400)
RBC: 3.18 MIL/uL — ABNORMAL LOW (ref 3.87–5.11)
RDW: 14.7 % (ref 11.5–15.5)
WBC: 12.4 10*3/uL — ABNORMAL HIGH (ref 4.0–10.5)

## 2013-10-14 NOTE — Progress Notes (Signed)
Patient ID: Erica Lang, female   DOB: 05-Dec-1976, 37 y.o.   MRN: [id number] PPD # 1 SVD  S:  Reports feeling sore, but well             Tolerating po/ No nausea or vomiting             Bleeding is light             Pain controlled with ibuprofen (OTC)             Up ad lib / ambulatory / voiding without difficulties    Newborn  Information for the patient's newborn:  Iyona, Pehrson [[id number]]  female  breast feeding  / Circumcision planning   O:  A & O x 3, in no apparent distress              VS:  Filed Vitals:   10/13/13 1950 10/13/13 2100 10/14/13 0130 10/14/13 0605  BP: 123/78 116/71 118/76 102/68  Pulse: 86 98 99 90  Temp: 98.9 F (37.2 C) 98.7 F (37.1 C) 98.6 F (37 C) 98.4 F (36.9 C)  TempSrc: Oral Oral Oral Oral  Resp: 20 20 20 20   Height:      Weight:      SpO2:        LABS:  Recent Labs  10/13/13 1755 10/14/13 0630  WBC 13.6* 12.4*  HGB 11.6* 10.4*  HCT 33.5* 29.8*  PLT 164 153    Blood type: O POS (10/16 0745)  Rubella: Immune (04/23 0000)     Lungs: Clear and unlabored  Heart: regular rate and rhythm / no murmurs  Abdomen: soft, non-tender, non-distended, normal bowel sounds             Fundus: firm, non-tender, U-1  Perineum: periurethral repair intact, no edema  Lochia: light  Extremities: trace edema, no calf pain or tenderness, no Homans    A/P: PPD # 1  37 y.o., [id number]   Active Problems:   Postpartum care following vaginal delivery (10/16)   Doing well - stable status  Routine post partum orders  Anticipate discharge tomorrow   Raelyn Mora, M, MSN, CNM 10/14/2013, 9:00 AM

## 2013-10-14 NOTE — Progress Notes (Signed)
CSW attempted to meet with MOB, but she was in the shower at this time.  CSW will attempt again at a later time.

## 2013-10-14 NOTE — Progress Notes (Signed)
MOB met with parents in MOB's first floor room to complete assessment for hx of anxiety.  Parents were very friendly and welcoming of CSW. MOB states everything has gone well while in the hospital and no emotional concerns at this time.  She states this delivery was much easier than her daughter's 4 years ago and that she experienced some PPD with her daughter, which she attributes to being physically ill after her birth.  She states she is feeling great at this time and hoping baby's birth and first day of PP will be an indication of the future.  CSW briefly reviewed signs and symptoms of PPD and asked her to call her doctor if she has concerns at any time. MOB agreed. CSW identifies no concerns or barriers to discharge when MOB and baby are medically ready.  FOB appears involved and supportive.  MOB seemed very appreciative and thanked CSW for the visit.

## 2013-10-14 NOTE — Anesthesia Postprocedure Evaluation (Signed)
Anesthesia Post Note  Patient: Erica Lang  Procedure(s) Performed: * No procedures listed *  Anesthesia type: Epidural  Patient location: Mother/Baby  Post pain: Pain level controlled  Post assessment: Post-op Vital signs reviewed  Last Vitals:  Filed Vitals:   10/14/13 0605  BP: 102/68  Pulse: 90  Temp: 36.9 C  Resp: 20    Post vital signs: Reviewed  Level of consciousness:alert  Complications: No apparent anesthesia complications

## 2013-10-15 MED ORDER — OXYCODONE-ACETAMINOPHEN 5-325 MG PO TABS: 1.0000 | ORAL_TABLET | Freq: Four times a day (QID) | ORAL | Status: DC | PRN

## 2013-10-15 MED ORDER — IBUPROFEN 600 MG PO TABS: 600.0000 mg | ORAL_TABLET | Freq: Four times a day (QID) | ORAL | Status: DC

## 2013-10-15 NOTE — Discharge Summary (Signed)
Obstetric Discharge Summary Reason for Admission: induction of labor Prenatal Procedures: ultrasound Intrapartum Procedures: spontaneous vaginal delivery Postpartum Procedures: none Complications-Operative and Postpartum: periurethral laceration Hemoglobin  Date Value Range Status  10/14/2013 10.4* 12.0 - 15.0 g/dL Final     HCT  Date Value Range Status  10/14/2013 29.8* 36.0 - 46.0 % Final    Physical Exam:  General: alert, cooperative and no distress Lochia: appropriate Uterine Fundus: firm, midline, U-2 DVT Evaluation: No evidence of DVT seen on physical exam. Negative Homan's sign. No cords or calf tenderness. Calf/Ankle 1+ edema is present.  Discharge Diagnoses: Term Pregnancy-delivered  Discharge Information: Date: 10/15/2013 Activity: pelvic rest Diet: routine Medications: PNV, Ibuprofen and Percocet Condition: stable Instructions: refer to practice specific booklet Discharge to: home Follow-up Information   Follow up with Lenoard Aden, MD. Schedule an appointment as soon as possible for a visit in 6 weeks.   Specialty:  Obstetrics and Gynecology   Contact information:   [address] [place] [address] [phone]       Newborn Data: Live born female on 10/13/2013 Birth Weight: 8 lb 9 oz (3884 g) APGAR: 8, 9  Home with mother.  Kenard Gower, MSN, CNM 10/15/2013, 9:07 AM

## 2013-10-15 NOTE — Progress Notes (Signed)
Patient ID: Erica Lang, female   DOB: 08-04-1976, 37 y.o.   MRN: [id number] Post Partum Day #2            Information for the patient's newborn:  Chayna, Surratt [[id number]]  female   / circumcision done Feeding: breast  Subjective: No HA, SOB, CP, F/C, breast symptoms. Pain minimally controlled with ibuprofen - requesting something stronger. Normal vaginal bleeding, no clots.      Objective:  Temp:  [98.1 F (36.7 C)-98.8 F (37.1 C)] 98.1 F (36.7 C) (10/18 0657) Pulse Rate:  [94-98] 98 (10/18 0657) Resp:  [20] 20 (10/18 0657) BP: (107-127)/(63-75) 107/63 mmHg (10/18 0657)    Recent Labs  10/13/13 1755 10/14/13 0630  WBC 13.6* 12.4*  HGB 11.6* 10.4*  HCT 33.5* 29.8*  PLT 164 153    Blood type: --/--/O POS, O POS (10/16 0745) Rubella: Immune (04/23 0000)    Physical Exam:  General: alert, cooperative and no distress Uterine Fundus: firm, midline, U-2 Lochia: appropriate Perineum: periurethral repair healing well, edema none DVT Evaluation: No evidence of DVT seen on physical exam. Negative Homan's sign. No cords or calf tenderness. Calf/Ankle 1+ edema is present.    Assessment/Plan: PPD # 2 / 37 y.o., [id number] S/P: spontaneous vaginal   Active Problems:   Postpartum care following vaginal delivery (10/16)   Normal postpartum exam  Continue current postpartum care  D/C home   LOS: 2 days   Raelyn Mora, M, MSN, CNM 10/15/2013, 9:03 AM

## 2013-10-15 NOTE — Lactation Note (Signed)
This note was copied from the chart of [name]. Lactation Consultation Note  Patient Name: [name] [id number] Date: 10/15/2013 Reason for consult: Follow-up assessment Mom c/o of nipple soreness, no breakdown noted. Compression line visible when baby came off left breast. Assisted Mom with obtaining more depth with the latch and positioning. Care for sore nipples reviewed, comfort gels given with instructions. Engorgement care reviewed if needed. Advised of OP services and support group.   Maternal Data    Feeding Feeding Type: Breast Fed Length of feed: 15 min  LATCH Score/Interventions Latch: Grasps breast easily, tongue down, lips flanged, rhythmical sucking.  Audible Swallowing: A few with stimulation  Type of Nipple: Everted at rest and after stimulation  Comfort (Breast/Nipple): Filling, red/small blisters or bruises, mild/mod discomfort  Problem noted: Mild/Moderate discomfort (EBM advised) Interventions (Mild/moderate discomfort): Comfort gels  Hold (Positioning): Assistance needed to correctly position infant at breast and maintain latch. Intervention(s): Breastfeeding basics reviewed;Support Pillows;Position options;Skin to skin  LATCH Score: 7  Lactation Tools Discussed/Used Tools: Pump Breast pump type: Manual   Consult Status Consult Status: Complete Date: 10/15/13 Follow-up type: In-patient    Alfred Levins 10/15/2013, 11:20 AM

## 2013-10-19 ENCOUNTER — Encounter (HOSPITAL_COMMUNITY): Payer: Self-pay | Admitting: *Deleted

## 2013-10-19 ENCOUNTER — Inpatient Hospital Stay (HOSPITAL_COMMUNITY)
Admission: AD | Admit: 2013-10-19 | Discharge: 2013-10-19 | Disposition: A | Discharge status: Home / Self Care | Payer: Managed Care, Other (non HMO) | Source: Ambulatory Visit | Attending: Obstetrics and Gynecology | Admitting: Obstetrics and Gynecology

## 2013-10-19 ENCOUNTER — Inpatient Hospital Stay (HOSPITAL_COMMUNITY): Payer: Managed Care, Other (non HMO)

## 2013-10-19 DIAGNOSIS — O135 Gestational [pregnancy-induced] hypertension without significant proteinuria, complicating the puerperium: Secondary | ICD-10-CM | POA: Insufficient documentation

## 2013-10-19 DIAGNOSIS — R059 Cough, unspecified: Secondary | ICD-10-CM | POA: Insufficient documentation

## 2013-10-19 DIAGNOSIS — O99893 Other specified diseases and conditions complicating puerperium: Secondary | ICD-10-CM | POA: Insufficient documentation

## 2013-10-19 DIAGNOSIS — R05 Cough: Secondary | ICD-10-CM | POA: Insufficient documentation

## 2013-10-19 DIAGNOSIS — O1205 Gestational edema, complicating the puerperium: Secondary | ICD-10-CM | POA: Insufficient documentation

## 2013-10-19 DIAGNOSIS — R7402 Elevation of levels of lactic acid dehydrogenase (LDH): Secondary | ICD-10-CM | POA: Insufficient documentation

## 2013-10-19 DIAGNOSIS — R7401 Elevation of levels of liver transaminase levels: Secondary | ICD-10-CM | POA: Insufficient documentation

## 2013-10-19 DIAGNOSIS — R079 Chest pain, unspecified: Secondary | ICD-10-CM | POA: Insufficient documentation

## 2013-10-19 DIAGNOSIS — O165 Unspecified maternal hypertension, complicating the puerperium: Secondary | ICD-10-CM | POA: Diagnosis present

## 2013-10-19 DIAGNOSIS — R0602 Shortness of breath: Secondary | ICD-10-CM | POA: Insufficient documentation

## 2013-10-19 LAB — COMPREHENSIVE METABOLIC PANEL
ALT: 60 U/L — ABNORMAL HIGH (ref 0–35)
Alkaline Phosphatase: 108 U/L (ref 39–117)
BUN: 9 mg/dL (ref 6–23)
CO2: 22 mEq/L (ref 19–32)
Calcium: 9.5 mg/dL (ref 8.4–10.5)
Creatinine, Ser: 0.6 mg/dL (ref 0.50–1.10)
GFR calc Af Amer: >90 mL/min (ref >90)
GFR calc non Af Amer: >90 mL/min (ref >90)
Glucose, Bld: 104 mg/dL — ABNORMAL HIGH (ref 70–99)
Potassium: 3.9 mEq/L (ref 3.5–5.1)
Sodium: 139 mEq/L (ref 135–145)
Total Bilirubin: 0.4 mg/dL (ref 0.3–1.2)
Total Protein: 6.2 g/dL (ref 6.0–8.3)

## 2013-10-19 LAB — CBC
HCT: 29.9 % — ABNORMAL LOW (ref 36.0–46.0)
Hemoglobin: 10.5 g/dL — ABNORMAL LOW (ref 12.0–15.0)
MCH: 33 pg (ref 26.0–34.0)
MCHC: 35.1 g/dL (ref 30.0–36.0)
MCV: 94 fL (ref 78.0–100.0)
RBC: 3.18 MIL/uL — ABNORMAL LOW (ref 3.87–5.11)

## 2013-10-19 LAB — URINALYSIS, ROUTINE W REFLEX MICROSCOPIC
Bilirubin Urine: NEGATIVE
Glucose, UA: NEGATIVE mg/dL
Ketones, ur: NEGATIVE mg/dL
Protein, ur: NEGATIVE mg/dL
Urobilinogen, UA: 0.2 mg/dL (ref 0.0–1.0)

## 2013-10-19 LAB — LACTATE DEHYDROGENASE: LDH: 265 U/L — ABNORMAL HIGH (ref 94–250)

## 2013-10-19 LAB — URINE MICROSCOPIC-ADD ON

## 2013-10-19 MED ORDER — FUROSEMIDE 40 MG PO TABS: 40.0000 mg | ORAL_TABLET | Freq: Every day | ORAL | Status: DC

## 2013-10-19 MED ORDER — FUROSEMIDE 40 MG PO TABS
40.0000 mg | ORAL_TABLET | Freq: Once | ORAL | Status: AC
  Administered 2013-10-19: 40 mg via ORAL
  Filled 2013-10-19: qty 1

## 2013-10-19 NOTE — MAU Provider Note (Signed)
History     CSN: [id number]  Arrival date and time: 10/19/13 1213 Provider notified: 1242 Provider on unit: 1242 Provider at bedside: 1245     Chief Complaint  Patient presents with  . Shortness of Breath   HPI  Ms. Erica Lang is a 37 y.o G60P2012 female who had a vaginal delivery on 10/13/13.  She was sent to MAU from  The office with c/o chest pressure on left side, swelling and pain in both LE.  She has some SOB.  Denies dizziness or LOC.  Past Medical History  Diagnosis Date  . Anemia   . Anxiety   . Allergy   . Hypertension   . Abnormal Pap smear   . Hidradenitis   . Obesity   . PCOS (polycystic ovarian syndrome)   . Asthma   . 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 Hx of varicella     Past Surgical History  Procedure Laterality Date  . Vulva surgery    . Pelvic laparoscopy      Family History  Problem Relation Age of Onset  . Stroke Mother   . Hypertension Mother   . Heart disease Mother   . Deep vein thrombosis Mother     both legs, arms and back, 2 blood clots migrted to her chest area  . Diabetes Mother     History  Substance Use Topics  . Smoking status: Never Smoker   . Smokeless tobacco: Never Used  . Alcohol Use: No    Allergies:  Allergies  Allergen Reactions  . Bee Venom Anaphylaxis    Prescriptions prior to admission  Medication Sig Dispense Refill  . albuterol (PROVENTIL HFA;VENTOLIN HFA) 108 (90 BASE) MCG/ACT inhaler Inhale 2 puffs into the lungs every 6 (six) hours as needed for wheezing.  1 Inhaler  0  . EPINEPHrine (EPI-PEN) 0.3 mg/0.3 mL DEVI Inject 0.3 mLs (0.3 mg total) into the muscle once.  1 Device  0  . ibuprofen (ADVIL,MOTRIN) 600 MG tablet Take 1 tablet (600 mg total) by mouth every 6 (six) hours.  30 tablet  0  . oxyCODONE-acetaminophen (PERCOCET/ROXICET) 5-325 MG per tablet Take 1-2 tablets by mouth every 6 (six) hours as needed for pain.  30 tablet  0  . Prenatal Vit-Fe Fumarate-FA (PRENATAL MULTIVITAMIN)  TABS tablet Take 1 tablet by mouth daily at 12 noon.        Review of Systems  Constitutional: Negative.   HENT: Negative.   Eyes: Negative.   Respiratory: Positive for shortness of breath.   Cardiovascular: Positive for chest pain.       Chest pressure  Gastrointestinal: Negative.   Genitourinary: Negative.   Musculoskeletal:       Pain from swelling in LE  Skin: Negative.   Neurological: Negative.   Endo/Heme/Allergies: Negative.   Psychiatric/Behavioral: Negative.    Results for orders placed during the hospital encounter of 10/19/13 (from the past 24 hour(s))  CBC     Status: Abnormal   Collection Time    10/19/13 12:53 PM      Result Value Range   WBC 6.7  4.0 - 10.5 K/uL   RBC 3.18 (*) 3.87 - 5.11 MIL/uL   Hemoglobin 10.5 (*) 12.0 - 15.0 g/dL   HCT 40.9 (*) 81.1 - 91.4 %   MCV 94.0  78.0 - 100.0 fL   MCH 33.0  26.0 - 34.0 pg   MCHC 35.1  30.0 - 36.0 g/dL   RDW 78.2  95.6 -  15.5 %   Platelets 188  150 - 400 K/uL  COMPREHENSIVE METABOLIC PANEL     Status: Abnormal   Collection Time    10/19/13 12:53 PM      Result Value Range   Sodium 139  135 - 145 mEq/L   Potassium 3.9  3.5 - 5.1 mEq/L   Chloride 107  96 - 112 mEq/L   CO2 22  19 - 32 mEq/L   Glucose, Bld 104 (*) 70 - 99 mg/dL   BUN 9  6 - 23 mg/dL   Creatinine, Ser 4.09  0.50 - 1.10 mg/dL   Calcium 9.5  8.4 - 81.1 mg/dL   Total Protein 6.2  6.0 - 8.3 g/dL   Albumin 2.9 (*) 3.5 - 5.2 g/dL   AST 41 (*) 0 - 37 U/L   ALT 60 (*) 0 - 35 U/L   Alkaline Phosphatase 108  39 - 117 U/L   Total Bilirubin 0.4  0.3 - 1.2 mg/dL   GFR calc non Af Amer >90  >90 mL/min   GFR calc Af Amer >90  >90 mL/min  LACTATE DEHYDROGENASE     Status: Abnormal   Collection Time    10/19/13 12:53 PM      Result Value Range   LDH 265 (*) 94 - 250 U/L  URIC ACID     Status: None   Collection Time    10/19/13 12:53 PM      Result Value Range   Uric Acid, Serum 5.8  2.4 - 7.0 mg/dL  URINALYSIS, ROUTINE W REFLEX MICROSCOPIC     Status:  Abnormal   Collection Time    10/19/13 12:55 PM      Result Value Range   Color, Urine YELLOW  YELLOW   APPearance CLEAR  CLEAR   Specific Gravity, Urine 1.010  1.005 - 1.030   pH 7.0  5.0 - 8.0   Glucose, UA NEGATIVE  NEGATIVE mg/dL   Hgb urine dipstick LARGE (*) NEGATIVE   Bilirubin Urine NEGATIVE  NEGATIVE   Ketones, ur NEGATIVE  NEGATIVE mg/dL   Protein, ur NEGATIVE  NEGATIVE mg/dL   Urobilinogen, UA 0.2  0.0 - 1.0 mg/dL   Nitrite NEGATIVE  NEGATIVE   Leukocytes, UA SMALL (*) NEGATIVE  URINE MICROSCOPIC-ADD ON     Status: None   Collection Time    10/19/13 12:55 PM      Result Value Range   Squamous Epithelial / LPF RARE  RARE   WBC, UA 3-6  <3 WBC/hpf   RBC / HPF 21-50  <3 RBC/hpf   Bacteria, UA RARE  RARE   Dg Chest 2 View  10/19/2013   CLINICAL DATA:  Chest pressure. Cough. Low-grade fever. One week postpartum.  EXAM: CHEST  2 VIEW  COMPARISON:  11/27/2011  FINDINGS: The heart size and mediastinal contours are within normal limits. Both lungs are clear. The visualized skeletal structures are unremarkable.  IMPRESSION: No active cardiopulmonary disease.   Electronically Signed   By: Myles Rosenthal M.D.   On: 10/19/2013 13:19   Physical Exam   Blood pressure 140/81, pulse 70, temperature 98.6 F (37 C), temperature source Oral, resp. rate 22, last menstrual period 01/13/2013, SpO2 100.00%. BP: 140/81, 133/74  Physical Exam  Constitutional: She is oriented to person, place, and time. She appears well-developed and well-nourished.  HENT:  Head: Normocephalic and atraumatic.  Eyes: Conjunctivae are normal. Pupils are equal, round, and reactive to light.  Neck: Normal range of motion.  Neck supple.  Cardiovascular: Normal rate, regular rhythm, normal heart sounds and intact distal pulses.   3+ pitting edema, bilateral LE  Respiratory: Effort normal and breath sounds normal.  GI: Soft. Bowel sounds are normal.  Genitourinary:  Exam deferred  Musculoskeletal: Normal range of  motion.  Neurological: She is alert and oriented to person, place, and time.  Absent DTRs  Skin: Skin is warm and dry.  Psychiatric: She has a normal mood and affect. Her behavior is normal. Judgment and thought content normal.    MAU Course  Procedures CBC CMP LDH Uric Acid Urinalysis 2 view Chest X-Ray  Assessment and Plan  37 y.o. [id number] S/P SVD 10/16 Postpartum Hypertension Postpartum Edema  Lasix 40 mg po before discharge home / Rx sent for Lasix 40 mg 1 p.o. Daily x 14 days NR Discharge home Follow-up with Dr. Billy Coast tomorrow 10/23 for repeat CMP in office  *Consult with Dr. Billy Coast / will see pt in MAU  Kenard Gower, MSN, CNM 10/19/2013, 12:57 PM

## 2013-10-19 NOTE — MAU Note (Addendum)
Patient presents to MAU with c/o ShOB, swelling, generalized left sided pain, headache and chest pressure. Sats 100%. Patient c/o of increased bilateral swelling in feet. No diaphoresis noted at this time. Patient post vaginal delivery last Thursday.

## 2013-10-19 NOTE — MAU Provider Note (Signed)
History   Postpartum SOB with previous history of PP PEC No HA or CP. No history of HTN exacerbation with pregnancy. Nl uncomplicated SVD.  CSN: [id number]  Arrival date and time: 10/19/13 1213   First Provider Initiated Contact with Patient 10/19/13 1331      Chief Complaint  Patient presents with  . Shortness of Breath   HPI: see above   Past Medical History  Diagnosis Date  . Anemia   . Anxiety   . Allergy   . Hypertension   . Abnormal Pap smear   . Hidradenitis   . Obesity   . PCOS (polycystic ovarian syndrome)   . Asthma   . 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 Hx of varicella   . Postpartum hypertension 10/19/2013  . Postpartum edema 10/19/2013    Past Surgical History  Procedure Laterality Date  . Vulva surgery    . Pelvic laparoscopy      Family History  Problem Relation Age of Onset  . Stroke Mother   . Hypertension Mother   . Heart disease Mother   . Deep vein thrombosis Mother     both legs, arms and back, 2 blood clots migrted to her chest area  . Diabetes Mother     History  Substance Use Topics  . Smoking status: Never Smoker   . Smokeless tobacco: Never Used  . Alcohol Use: No    Allergies:  Allergies  Allergen Reactions  . Bee Venom Anaphylaxis    Prescriptions prior to admission  Medication Sig Dispense Refill  . Docusate Calcium (STOOL SOFTENER PO) Take 2 tablets by mouth daily.      Marland Kitchen ibuprofen (ADVIL,MOTRIN) 600 MG tablet Take 1 tablet (600 mg total) by mouth every 6 (six) hours.  30 tablet  0  . oxyCODONE-acetaminophen (PERCOCET/ROXICET) 5-325 MG per tablet Take 1-2 tablets by mouth every 6 (six) hours as needed for pain.  30 tablet  0  . Prenatal Vit-Fe Fumarate-FA (PRENATAL MULTIVITAMIN) TABS tablet Take 1 tablet by mouth daily at 12 noon.      Marland Kitchen albuterol (PROVENTIL HFA;VENTOLIN HFA) 108 (90 BASE) MCG/ACT inhaler Inhale 2 puffs into the lungs every 6 (six) hours as needed for wheezing.  1 Inhaler  0  .  EPINEPHrine (EPI-PEN) 0.3 mg/0.3 mL DEVI Inject 0.3 mLs (0.3 mg total) into the muscle once.  1 Device  0    ROS Physical Exam NcAT Lungs: CTA CV: RRR ABd: nontender, nl BS No CVAT EXT: 2+ edema, nl DTRs Pelvic: deferred Neuro: non focal Skin: intact   Blood pressure 133/74, pulse 67, temperature 98.6 F (37 C), temperature source Oral, resp. rate 22, last menstrual period 01/13/2013, SpO2 100.00%.  Physical Exam above  MAU Course  Procedures CBC    Component Value Date/Time   WBC 6.7 10/19/2013 1253   RBC 3.18* 10/19/2013 1253   HGB 10.5* 10/19/2013 1253   HCT 29.9* 10/19/2013 1253   PLT 188 10/19/2013 1253   MCV 94.0 10/19/2013 1253   MCH 33.0 10/19/2013 1253   MCHC 35.1 10/19/2013 1253   RDW 14.4 10/19/2013 1253    CMP     Component Value Date/Time   NA 139 10/19/2013 1253   K 3.9 10/19/2013 1253   CL 107 10/19/2013 1253   CO2 22 10/19/2013 1253   GLUCOSE 104* 10/19/2013 1253   BUN 9 10/19/2013 1253   CREATININE 0.60 10/19/2013 1253   CALCIUM 9.5 10/19/2013 1253   PROT 6.2 10/19/2013 1253  ALBUMIN 2.9* 10/19/2013 1253   AST 41* 10/19/2013 1253   ALT 60* 10/19/2013 1253   ALKPHOS 108 10/19/2013 1253   BILITOT 0.4 10/19/2013 1253   GFRNONAA >90 10/19/2013 1253   GFRAA >90 10/19/2013 1253   CXR- verbal Normal per Dr. Eppie Gibson.   MDM na  Assessment and Plan   PP SOB with nl BP , nl UA and slightly elevated AST/ALT. WIll dc home on Lasix 40mg  qd and fu office for CMP tomorrow. PEC precautions.  Santiana Glidden J 10/19/2013, 1:42 PM

## 2013-10-20 ENCOUNTER — Inpatient Hospital Stay (HOSPITAL_COMMUNITY): Admission: AD | Admit: 2013-10-20 | Payer: Self-pay | Source: Ambulatory Visit | Admitting: Obstetrics and Gynecology

## 2013-10-28 ENCOUNTER — Encounter (HOSPITAL_COMMUNITY): Payer: Self-pay | Admitting: Emergency Medicine

## 2013-10-28 ENCOUNTER — Inpatient Hospital Stay (HOSPITAL_COMMUNITY)
Admission: EM | Admit: 2013-10-28 | Discharge: 2013-10-30 | DRG: 776 | Disposition: A | Discharge status: Home / Self Care | Payer: Managed Care, Other (non HMO) | Attending: Obstetrics & Gynecology | Admitting: Obstetrics & Gynecology

## 2013-10-28 ENCOUNTER — Other Ambulatory Visit: Payer: Self-pay

## 2013-10-28 ENCOUNTER — Emergency Department (HOSPITAL_COMMUNITY): Payer: Managed Care, Other (non HMO)

## 2013-10-28 DIAGNOSIS — M79609 Pain in unspecified limb: Secondary | ICD-10-CM | POA: Diagnosis present

## 2013-10-28 DIAGNOSIS — R0989 Other specified symptoms and signs involving the circulatory and respiratory systems: Secondary | ICD-10-CM | POA: Diagnosis present

## 2013-10-28 DIAGNOSIS — I059 Rheumatic mitral valve disease, unspecified: Secondary | ICD-10-CM

## 2013-10-28 DIAGNOSIS — I2510 Atherosclerotic heart disease of native coronary artery without angina pectoris: Secondary | ICD-10-CM | POA: Diagnosis present

## 2013-10-28 DIAGNOSIS — R0602 Shortness of breath: Secondary | ICD-10-CM | POA: Diagnosis present

## 2013-10-28 DIAGNOSIS — R0609 Other forms of dyspnea: Secondary | ICD-10-CM | POA: Diagnosis present

## 2013-10-28 DIAGNOSIS — I498 Other specified cardiac arrhythmias: Secondary | ICD-10-CM | POA: Diagnosis present

## 2013-10-28 DIAGNOSIS — O99893 Other specified diseases and conditions complicating puerperium: Secondary | ICD-10-CM | POA: Diagnosis present

## 2013-10-28 DIAGNOSIS — IMO0001 Reserved for inherently not codable concepts without codable children: Principal | ICD-10-CM | POA: Diagnosis present

## 2013-10-28 DIAGNOSIS — R079 Chest pain, unspecified: Secondary | ICD-10-CM | POA: Diagnosis present

## 2013-10-28 DIAGNOSIS — R209 Unspecified disturbances of skin sensation: Secondary | ICD-10-CM | POA: Diagnosis present

## 2013-10-28 DIAGNOSIS — R200 Anesthesia of skin: Secondary | ICD-10-CM

## 2013-10-28 DIAGNOSIS — O1490 Unspecified pre-eclampsia, unspecified trimester: Secondary | ICD-10-CM

## 2013-10-28 DIAGNOSIS — R51 Headache: Secondary | ICD-10-CM | POA: Diagnosis present

## 2013-10-28 DIAGNOSIS — I10 Essential (primary) hypertension: Secondary | ICD-10-CM | POA: Diagnosis present

## 2013-10-28 HISTORY — PX: TRANSTHORACIC ECHOCARDIOGRAM: SHX275

## 2013-10-28 LAB — CBC
HCT: 35.2 % — ABNORMAL LOW (ref 36.0–46.0)
Hemoglobin: 12.2 g/dL (ref 12.0–15.0)
MCH: 32.9 pg (ref 26.0–34.0)
Platelets: 228 10*3/uL (ref 150–400)
RBC: 3.71 MIL/uL — ABNORMAL LOW (ref 3.87–5.11)
WBC: 5.2 10*3/uL (ref 4.0–10.5)

## 2013-10-28 LAB — URINE MICROSCOPIC-ADD ON

## 2013-10-28 LAB — URINALYSIS, ROUTINE W REFLEX MICROSCOPIC
Glucose, UA: NEGATIVE mg/dL
Ketones, ur: NEGATIVE mg/dL
Urobilinogen, UA: 0.2 mg/dL (ref 0.0–1.0)
pH: 7 (ref 5.0–8.0)

## 2013-10-28 LAB — HEPATIC FUNCTION PANEL
Albumin: 3.4 g/dL — ABNORMAL LOW (ref 3.5–5.2)
Alkaline Phosphatase: 83 U/L (ref 39–117)
Bilirubin, Direct: <0.1 mg/dL (ref 0.0–0.3)
Total Bilirubin: 0.5 mg/dL (ref 0.3–1.2)
Total Protein: 6.8 g/dL (ref 6.0–8.3)

## 2013-10-28 LAB — POCT I-STAT TROPONIN I
Troponin i, poc: 0 ng/mL (ref 0.00–0.08)
Troponin i, poc: 0 ng/mL (ref 0.00–0.08)

## 2013-10-28 LAB — BASIC METABOLIC PANEL
CO2: 26 mEq/L (ref 19–32)
Chloride: 105 mEq/L (ref 96–112)
Potassium: 3.3 mEq/L — ABNORMAL LOW (ref 3.5–5.1)
Sodium: 142 mEq/L (ref 135–145)

## 2013-10-28 LAB — MRSA PCR SCREENING: MRSA by PCR: NEGATIVE

## 2013-10-28 LAB — PROTEIN / CREATININE RATIO, URINE: Creatinine, Urine: 23.7 mg/dL

## 2013-10-28 MED ORDER — ACETAMINOPHEN 500 MG PO TABS
1000.0000 mg | ORAL_TABLET | Freq: Three times a day (TID) | ORAL | Status: DC | PRN
  Administered 2013-10-28 – 2013-10-29 (×2): 1000 mg via ORAL
  Filled 2013-10-28 (×2): qty 2

## 2013-10-28 MED ORDER — HYDRALAZINE HCL 20 MG/ML IJ SOLN
10.0000 mg | Freq: Once | INTRAMUSCULAR | Status: AC
  Administered 2013-10-28: 10 mg via INTRAVENOUS
  Filled 2013-10-28: qty 1

## 2013-10-28 MED ORDER — MAGNESIUM SULFATE 40 MG/ML IJ SOLN
4.0000 g | Freq: Once | INTRAMUSCULAR | Status: AC
  Administered 2013-10-28: 4 g via INTRAVENOUS
  Filled 2013-10-28 (×3): qty 100

## 2013-10-28 MED ORDER — IOHEXOL 350 MG/ML SOLN
80.0000 mL | Freq: Once | INTRAVENOUS | Status: AC | PRN
  Administered 2013-10-28: 80 mL via INTRAVENOUS

## 2013-10-28 MED ORDER — ONDANSETRON HCL 4 MG/2ML IJ SOLN
4.0000 mg | Freq: Once | INTRAMUSCULAR | Status: AC
  Administered 2013-10-28: 4 mg via INTRAVENOUS
  Filled 2013-10-28: qty 2

## 2013-10-28 MED ORDER — OXYCODONE-ACETAMINOPHEN 5-325 MG PO TABS
1.0000 | ORAL_TABLET | Freq: Once | ORAL | Status: AC
  Administered 2013-10-28: 1 via ORAL
  Filled 2013-10-28: qty 1

## 2013-10-28 MED ORDER — LACTATED RINGERS IV SOLN
INTRAVENOUS | Status: DC
  Administered 2013-10-28 – 2013-10-29 (×2): via INTRAVENOUS

## 2013-10-28 MED ORDER — MAGNESIUM SULFATE 40 G IN LACTATED RINGERS - SIMPLE
1.0000 g/h | INTRAVENOUS | Status: DC
  Administered 2013-10-28: 2 g/h via INTRAVENOUS
  Filled 2013-10-28: qty 500

## 2013-10-28 NOTE — ED Notes (Signed)
Call Arlana Lindau at [facility] with results of tests.

## 2013-10-28 NOTE — ED Provider Notes (Signed)
CSN: [id number]     Arrival date & time 10/28/13  1246 History   First MD Initiated Contact with Patient 10/28/13 1317     Chief Complaint  Patient presents with  . Arm Pain  . Chest Pain  . Headache   (Consider location/radiation/quality/duration/timing/severity/associated sxs/prior Treatment) HPI Comments: Patient is 2 weeks post partum with her second child.  She reports that after the birth of her first child she had eclampsia where she was admitted and "they had to draw fluid off me".  She denies seizures at that time.  She reports that since delivery of this second child she has been followed for hypertension by her OB, she states that she was placed on labetalol but has not gotten this filled yet.  She reports continued lower and upper extremity edema.  She also complains of upper mid sternal chest pain and left arm and leg pain.  She reports that the pain is episodic, not associated with inspiration, she also denies shortness of breath, weakness on one side, fever, chills, abdominal pain, numbness or tingling.  She states that she has been also having mild headache as well.  Patient is a 37 y.o. female presenting with arm pain, chest pain, and headaches. The history is provided by the patient and the spouse. No language interpreter was used.  Arm Pain This is a new problem. The current episode started 1 to 4 weeks ago. The problem occurs constantly. The problem has been unchanged. Associated symptoms include chest pain, fatigue and myalgias. Pertinent negatives include no abdominal pain, arthralgias, chills, congestion, coughing, fever, headaches, nausea, neck pain, numbness, swollen glands, urinary symptoms, visual change, vomiting or weakness. Nothing aggravates the symptoms. She has tried nothing for the symptoms. The treatment provided no relief.  Chest Pain Associated symptoms: fatigue   Associated symptoms: no abdominal pain, no cough, no fever, no headache, no nausea, no numbness,  not vomiting and no weakness   Headache Associated symptoms: fatigue and myalgias   Associated symptoms: no abdominal pain, no congestion, no cough, no fever, no nausea, no neck pain, no numbness, no swollen glands, no visual change and no vomiting     Past Medical History  Diagnosis Date  . Anemia   . Anxiety   . Allergy   . Hypertension   . Abnormal Pap smear   . Hidradenitis   . Obesity   . PCOS (polycystic ovarian syndrome)   . Asthma   . 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 Hx of varicella   . Postpartum hypertension 10/19/2013  . Postpartum edema 10/19/2013   Past Surgical History  Procedure Laterality Date  . Vulva surgery    . Pelvic laparoscopy     Family History  Problem Relation Age of Onset  . Stroke Mother   . Hypertension Mother   . Heart disease Mother   . Deep vein thrombosis Mother     both legs, arms and back, 2 blood clots migrted to her chest area  . Diabetes Mother    History  Substance Use Topics  . Smoking status: Never Smoker   . Smokeless tobacco: Never Used  . Alcohol Use: No   OB History   Grav Para Term Preterm Abortions TAB SAB Ect Mult Living   3 2 2  1  1   2      Review of Systems  Constitutional: Positive for fatigue. Negative for fever and chills.  HENT: Negative for congestion.   Respiratory: Negative for cough.  Cardiovascular: Positive for chest pain.  Gastrointestinal: Negative for nausea, vomiting and abdominal pain.  Musculoskeletal: Positive for myalgias. Negative for arthralgias and neck pain.  Neurological: Negative for weakness, numbness and headaches.  All other systems reviewed and are negative.    Allergies  Bee venom  Home Medications   Current Outpatient Rx  Name  Route  Sig  Dispense  Refill  . albuterol (PROVENTIL HFA;VENTOLIN HFA) 108 (90 BASE) MCG/ACT inhaler   Inhalation   Inhale 2 puffs into the lungs every 6 (six) hours as needed for wheezing.   1 Inhaler   0   . docusate sodium  (COLACE) 100 MG capsule   Oral   Take 100 mg by mouth 2 (two) times daily.         Marland Kitchen EPINEPHrine (EPI-PEN) 0.3 mg/0.3 mL DEVI   Intramuscular   Inject 0.3 mLs (0.3 mg total) into the muscle once.   1 Device   0   . furosemide (LASIX) 40 MG tablet   Oral   Take 1 tablet (40 mg total) by mouth daily.   14 tablet   0   . ibuprofen (ADVIL,MOTRIN) 600 MG tablet   Oral   Take 1 tablet (600 mg total) by mouth every 6 (six) hours.   30 tablet   0   . oxyCODONE-acetaminophen (PERCOCET/ROXICET) 5-325 MG per tablet   Oral   Take 1-2 tablets by mouth every 6 (six) hours as needed for pain.   30 tablet   0   . polyethylene glycol powder (GLYCOLAX/MIRALAX) powder   Oral   Take 17 g by mouth daily.         . Prenatal Vit-Fe Fumarate-FA (PRENATAL MULTIVITAMIN) TABS tablet   Oral   Take 1 tablet by mouth daily at 12 noon.          BP 158/83  Pulse 57  Temp(Src) 98.6 F (37 C) (Oral)  Resp 18  SpO2 99%  LMP 01/13/2013 Physical Exam  Nursing note and vitals reviewed. Constitutional: She is oriented to person, place, and time. She appears well-developed and well-nourished. No distress.  HENT:  Head: Normocephalic and atraumatic.  Right Ear: External ear normal.  Left Ear: External ear normal.  Nose: Nose normal.  Mouth/Throat: Oropharynx is clear and moist. No oropharyngeal exudate.  Eyes: Conjunctivae are normal. No scleral icterus.  Neck: Normal range of motion. Neck supple.  Cardiovascular: Normal rate, regular rhythm and normal heart sounds.  Exam reveals no gallop and no friction rub.   No murmur heard. Pulmonary/Chest: Effort normal and breath sounds normal. No respiratory distress. She has no wheezes. She has no rales. She exhibits no tenderness.  Abdominal: Soft. Bowel sounds are normal. She exhibits no distension. There is no tenderness. There is no rebound and no guarding.  Musculoskeletal: Normal range of motion. She exhibits edema. She exhibits no tenderness.   Bilateral 2+ non-pitting edema  Lymphadenopathy:    She has no cervical adenopathy.  Neurological: She is alert and oriented to person, place, and time. No cranial nerve deficit. She exhibits normal muscle tone. Coordination normal.  Skin: Skin is warm and dry. No rash noted. No erythema. No pallor.  Psychiatric: She has a normal mood and affect. Her behavior is normal. Judgment and thought content normal.    ED Course  Procedures (including critical care time) Labs Review Labs Reviewed  CBC - Abnormal; Notable for the following:    RBC 3.71 (*)    HCT 35.2 (*)  All other components within normal limits  BASIC METABOLIC PANEL - Abnormal; Notable for the following:    Potassium 3.3 (*)    All other components within normal limits  URINALYSIS, ROUTINE W REFLEX MICROSCOPIC  HEPATIC FUNCTION PANEL  POCT I-STAT TROPONIN I   Imaging Review Dg Chest 2 View  10/28/2013   CLINICAL DATA:  Chest pain, difficulty breathing  EXAM: CHEST  2 VIEW  COMPARISON:  10/19/2013  FINDINGS: Cardiomediastinal silhouette is stable. No acute infiltrate or pleural effusion. No pulmonary edema. Bony thorax is unremarkable.  IMPRESSION: No active disease. No significant change.   Electronically Signed   By: Natasha Mead M.D.   On: 10/28/2013 13:57    EKG Interpretation     Ventricular Rate:  51 PR Interval:  158 QRS Duration: 80 QT Interval:  464 QTC Calculation: 427 R Axis:   73 Text Interpretation:  Sinus bradycardia Otherwise normal ECG No previous ECGs available           4:00 PM Symptoms concerning for pre-eclampsia, we have spoken with Dr. Juliene Pina who [facility]  Requests echo here, labs stable from 1 week ago, no protein in urine, will start Mag.  CRITICAL CARE Performed by: Patrecia Pour. Total critical care time: 60 Critical care time was exclusive of separately billable procedures and treating other patients. Critical care was necessary to treat  or prevent imminent or life-threatening deterioration. Critical care was time spent personally by me on the following activities: development of treatment plan with patient and/or surrogate as well as nursing, discussions with consultants, evaluation of patient's response to treatment, examination of patient, obtaining history from patient or surrogate, ordering and performing treatments and interventions, ordering and review of laboratory studies, ordering and review of radiographic studies, pulse oximetry and re-evaluation of patient's condition.  MDM  Preeclampsia  Patient here with increased swelling, chest pain, headache and hypertension s/p delivery.  History of pre-eclampsia in post partum with first child, symptoms concerning for this.  I have gotten Dr. Manus Gunning involved with this patient and he has spoken with Dr. Juliene Pina.  After the patient has echo here, she will be transferred to [facility] for admission.    Izola Price Marisue Humble, PA-C 10/28/13 1605

## 2013-10-28 NOTE — ED Notes (Signed)
Echo at bedside

## 2013-10-28 NOTE — ED Notes (Signed)
Echo called and confirmed that she is on the list for today.

## 2013-10-28 NOTE — H&P (Signed)
Erica Lang is an 37 y.o. female G3P2012 postpartum 2 wks. Admitted to AICU after evaluation for HA, CP, SOB since several days. H/o Preeclampsia in past pregnancy with PP fluid overload and needed diuretics. BP stable/ normal in pregnancy and immediate postpartum. She was seen 1 wk back with HA, SOB, had negative CXR, PIH labs normal and was discharged with Lasix. She returned to office 10/30 with same complaints, BP was elevated and was given Labetalol Rx but didn't start. She came back to office with similar/ worsening symptoms this morning with HA (all over), left leg and arm pain (thinks since epidural) and SOB, CP and occasional spots in front of her eyes. She was sent to [facility] ED for evaluation with EKG/ CXR/ 2D Echo. She also head plain head CT and Chest CT angio.  Hydralazine 10mg  IV x1 improved BP and Percocet given in ER helped pain.  Denies fever. 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  Past Medical History  Diagnosis Date  . Anemia   . Anxiety   . Allergy   . Hypertension   . Abnormal Pap smear   . Hidradenitis   . Obesity   . PCOS (polycystic ovarian syndrome)   . Asthma   . 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 Hx of varicella   . Postpartum hypertension 10/19/2013  . Postpartum edema 10/19/2013    Past Surgical History  Procedure Laterality Date  . Vulva surgery    . Pelvic laparoscopy      Family History  Problem Relation Age of Onset  . Stroke Mother   . Hypertension Mother   . Heart disease Mother   . Deep vein thrombosis Mother     both legs, arms and back, 2 blood clots migrted to her chest area  . Diabetes Mother    Social History:  reports that she has never smoked. She has never used smokeless tobacco. She reports that she does not drink alcohol or use illicit drugs.  Allergies:  Allergies  Allergen Reactions  . Bee Venom Anaphylaxis   Prescriptions prior to admission  Medication Sig Dispense Refill  . albuterol (PROVENTIL HFA;VENTOLIN HFA)  108 (90 BASE) MCG/ACT inhaler Inhale 2 puffs into the lungs every 6 (six) hours as needed for wheezing.  1 Inhaler  0  . docusate sodium (COLACE) 100 MG capsule Take 100 mg by mouth 2 (two) times daily.      Marland Kitchen EPINEPHrine (EPI-PEN) 0.3 mg/0.3 mL DEVI Inject 0.3 mLs (0.3 mg total) into the muscle once.  1 Device  0  . furosemide (LASIX) 40 MG tablet Take 1 tablet (40 mg total) by mouth daily.  14 tablet  0  . ibuprofen (ADVIL,MOTRIN) 600 MG tablet Take 1 tablet (600 mg total) by mouth every 6 (six) hours.  30 tablet  0  . oxyCODONE-acetaminophen (PERCOCET/ROXICET) 5-325 MG per tablet Take 1-2 tablets by mouth every 6 (six) hours as needed for pain.  30 tablet  0  . polyethylene glycol powder (GLYCOLAX/MIRALAX) powder Take 17 g by mouth daily.      . Prenatal Vit-Fe Fumarate-FA (PRENATAL MULTIVITAMIN) TABS tablet Take 1 tablet by mouth daily at 12 noon.        Review of Systems  Constitutional: Negative for fever.  Eyes: Negative for blurred vision.  Respiratory: Positive for shortness of breath.   Cardiovascular: Positive for chest pain.  Gastrointestinal: Negative for heartburn, nausea, vomiting and abdominal pain.  Neurological: Positive for headaches. Negative for dizziness.  Psychiatric/Behavioral: Negative for depression.    Blood pressure 135/69, pulse 67, temperature 98 F (36.7 C), temperature source Oral, resp. rate 18, height 5\' 9"  (1.753 m), weight 238 lb 1.6 oz (108.001 kg), last menstrual period 01/13/2013, SpO2 100.00%, currently breastfeeding. Physical Exam A&O x 3, no acute distress. Pleasant HEENT neg, no focal neurologic exam Lungs CTA bilat CV RRR, S1S2 normal Abdo soft, non tender, non acute Extr no edema/ tenderness, no DVT on exam, reflexed +2/+2 Pelvic deferred   Results for orders placed during the hospital encounter of 10/28/13 (from the past 24 hour(s))  CBC     Status: Abnormal   Collection Time    10/28/13 12:55 PM      Result Value Range   WBC 5.2  4.0  - 10.5 K/uL   RBC 3.71 (*) 3.87 - 5.11 MIL/uL   Hemoglobin 12.2  12.0 - 15.0 g/dL   HCT 04.5 (*) 40.9 - 81.1 %   MCV 94.9  78.0 - 100.0 fL   MCH 32.9  26.0 - 34.0 pg   MCHC 34.7  30.0 - 36.0 g/dL   RDW 91.4  78.2 - 95.6 %   Platelets 228  150 - 400 K/uL  BASIC METABOLIC PANEL     Status: Abnormal   Collection Time    10/28/13 12:55 PM      Result Value Range   Sodium 142  135 - 145 mEq/L   Potassium 3.3 (*) 3.5 - 5.1 mEq/L   Chloride 105  96 - 112 mEq/L   CO2 26  19 - 32 mEq/L   Glucose, Bld 83  70 - 99 mg/dL   BUN 7  6 - 23 mg/dL   Creatinine, Ser 2.13  0.50 - 1.10 mg/dL   Calcium 9.2  8.4 - 08.6 mg/dL   GFR calc non Af Amer >90  >90 mL/min   GFR calc Af Amer >90  >90 mL/min  POCT I-STAT TROPONIN I     Status: None   Collection Time    10/28/13  1:20 PM      Result Value Range   Troponin i, poc 0.00  0.00 - 0.08 ng/mL   Comment 3           HEPATIC FUNCTION PANEL     Status: Abnormal   Collection Time    10/28/13  1:32 PM      Result Value Range   Total Protein 6.8  6.0 - 8.3 g/dL   Albumin 3.4 (*) 3.5 - 5.2 g/dL   AST 38 (*) 0 - 37 U/L   ALT 51 (*) 0 - 35 U/L   Alkaline Phosphatase 83  39 - 117 U/L   Total Bilirubin 0.5  0.3 - 1.2 mg/dL   Bilirubin, Direct <5.7  0.0 - 0.3 mg/dL   Indirect Bilirubin NOT CALCULATED  0.3 - 0.9 mg/dL  URINALYSIS, ROUTINE W REFLEX MICROSCOPIC     Status: Abnormal   Collection Time    10/28/13  2:15 PM      Result Value Range   Color, Urine YELLOW  YELLOW   APPearance CLOUDY (*) CLEAR   Specific Gravity, Urine 1.009  1.005 - 1.030   pH 7.0  5.0 - 8.0   Glucose, UA NEGATIVE  NEGATIVE mg/dL   Hgb urine dipstick LARGE (*) NEGATIVE   Bilirubin Urine NEGATIVE  NEGATIVE   Ketones, ur NEGATIVE  NEGATIVE mg/dL   Protein, ur NEGATIVE  NEGATIVE mg/dL   Urobilinogen, UA 0.2  0.0 -  1.0 mg/dL   Nitrite NEGATIVE  NEGATIVE   Leukocytes, UA LARGE (*) NEGATIVE  URINE MICROSCOPIC-ADD ON     Status: Abnormal   Collection Time    10/28/13  2:15 PM       Result Value Range   Squamous Epithelial / LPF FEW (*) RARE   WBC, UA 21-50  <3 WBC/hpf   RBC / HPF 7-10  <3 RBC/hpf   Bacteria, UA FEW (*) RARE  POCT I-STAT TROPONIN I     Status: None   Collection Time    10/28/13  4:46 PM      Result Value Range   Troponin i, poc 0.00  0.00 - 0.08 ng/mL   Comment 3             Dg Chest 2 View  10/28/2013   CLINICAL DATA:  Chest pain, difficulty breathing  EXAM: CHEST  2 VIEW  COMPARISON:  10/19/2013  FINDINGS: Cardiomediastinal silhouette is stable. No acute infiltrate or pleural effusion. No pulmonary edema. Bony thorax is unremarkable.  IMPRESSION: No active disease. No significant change.   Electronically Signed   By: Natasha Mead M.D.   On: 10/28/2013 13:57   Ct Head Wo Contrast  10/28/2013   CLINICAL DATA:  Two weeks postpartum. Left leg and left arm pain. Headache. Hypertension.  EXAM: CT HEAD WITHOUT CONTRAST  TECHNIQUE: Contiguous axial images were obtained from the base of the skull through the vertex without contrast.  COMPARISON:  None  FINDINGS: Normal appearance of the intracranial structures. No evidence for acute hemorrhage, mass lesion, midline shift, hydrocephalus or large infarct. No acute bony abnormality. The visualized sinuses are clear.  IMPRESSION: Normal appearing CT head without contrast. No evidence for intracranial hemorrhage or CT signs of eclampsia. No evidence of venous infarction.   Electronically Signed   By: Davonna Belling M.D.   On: 10/28/2013 15:57   Ct Angio Chest W/cm &/or Wo Cm  10/28/2013   CLINICAL DATA:  Chest pain, headache, 2 weeks post vaginal delivery  EXAM: CT ANGIOGRAPHY CHEST WITH CONTRAST  TECHNIQUE: Multidetector CT imaging of the chest was performed using the standard protocol during bolus administration of intravenous contrast. Multiplanar CT image reconstructions including MIPs were obtained to evaluate the vascular anatomy.  CONTRAST:  80mL OMNIPAQUE IOHEXOL 350 MG/ML SOLN  COMPARISON:  Chest  radiographs dated 10/28/2013  FINDINGS: No evidence of pulmonary embolism.  Minimal lingular atelectasis. Lungs are otherwise clear. No pleural effusion or pneumothorax.  Visualized thyroid is unremarkable.  Heart is top-normal in size/borderline enlarged. No pericardial effusion. Age advanced coronary atherosclerosis involving LAD, left circumflex, and RCA.  Increased soft tissue in the anterior mediastinum (series 4/ image 27), possibly reflecting residual thymus.  No suspicious mediastinal, hilar, or axillary lymphadenopathy.  Visualized upper abdomen is unremarkable.  Mild degenerative changes of the visualized thoracolumbar spine.  Review of the MIP images confirms the above findings.  IMPRESSION: No evidence of pulmonary embolism.  Borderline cardiomegaly.  Three vessel coronary atherosclerosis, age advanced. Severity of stenosis cannot be assessed on this nongated CT. Clinical correlation is suggested.   Electronically Signed   By: Charline Bills M.D.   On: 10/28/2013 16:04   2D- Echo of heart- Mild MR, Mild AR, otherwise normal cardiac function and walls.   Assessment/Plan: 37 yo, postpartum SVD 15 days with postpartum HTN, admitted with shortness of breath, chest pain and elevated BP after being seen in MAU last week for same/similar complaints.  Currently on magnesium sulphate for  presumed preeclampsia with urine p/c ratio pending. BP better since 10 mg IV Hydralazine in ER. Deferring Labetalol since pulse 50-60s. PEC labs stable, LFTs elevated some but stable since last week.  Monitor BP, will start Procardia and D/c magnesium in morning if p/c ratio negative/ normal. If indeed PEC, plan Hydrodrochlorthiazide for BP control. Stop Lasix that is was on since last week.  Cardiac and Pulmonary work up essentially negative - incl CXR, EKG, CT chest, CT head, 2-D Echo. Will need to bring Cardiology on board if symptoms continue.  May need Neuro consult if HA not better with BP control Assess home  situation.   Janson Lamar R 10/28/2013, 9:46 PM

## 2013-10-28 NOTE — Progress Notes (Signed)
  Echocardiogram 2D Echocardiogram has been performed.  Cloris Flippo 10/28/2013, 4:44 PM

## 2013-10-28 NOTE — ED Notes (Signed)
MD informed of radiologist's call regarding CT angio results.

## 2013-10-28 NOTE — ED Notes (Signed)
Dr[facility] Romeo Apple [facility] made aware of delay. Pt remains stable with NAD noted.  [facility] paged for a stat read of echo results.

## 2013-10-28 NOTE — ED Notes (Signed)
Erica Lang with [facility] returned page and states that she is trying to get the echo read by off going MD. Pt and family aware of delay and pt is aware that she cannot eat at this time. Pt will receive pain medication for arm and leg pain. Pt has not requested any medication but when asked she states that she would like to try some at this time for her arm and leg pain.

## 2013-10-28 NOTE — ED Notes (Signed)
Pt to go to room 373 in adult ICU. Steward Drone is receiving RN. Pt had vaginal deliver on October 16th.

## 2013-10-28 NOTE — ED Notes (Signed)
Patient transported to X-ray 

## 2013-10-28 NOTE — ED Notes (Signed)
Pt sent here 2 weeks post partum vaginal delivery; pt c/o left leg and arm pain with some CP and HA; pt having issues with hypertension

## 2013-10-28 NOTE — ED Provider Notes (Signed)
Medical screening examination/treatment/procedure(s) were conducted as a shared visit with non-physician practitioner(s) and myself.  I personally evaluated the patient during the encounter.  2 weeks postpartum with elevated BP, chest tightness, SOB, L arm and L leg "soreness".  CN 2-12 intact, no ataxia on finger to nose, no nystagmus, 5/5 strength throughout, no pronator drift, Romberg negative, normal gait.  CT head negative, no PE. BP 150-180 systolic. Concern for preeclampsia. Mag bolus and gtt started. No labetalol given bradycardia.  D/w dr. Juliene Pina who requests echo then accepts to [facility] ICU. Coronary calcifications on CT d/w Dr. Sharyn Lull who agrees with outpatient followup.  Chest pain is atypical for ACS, troponin negative, no ST changes on EKG.   EKG Interpretation     Ventricular Rate:  51 PR Interval:  158 QRS Duration: 80 QT Interval:  464 QTC Calculation: 427 R Axis:   73 Text Interpretation:  Sinus bradycardia Otherwise normal ECG No previous ECGs available            CRITICAL CARE Performed by: Glynn Octave Total critical care time: 30 Critical care time was exclusive of separately billable procedures and treating other patients. Critical care was necessary to treat or prevent imminent or life-threatening deterioration. Critical care was time spent personally by me on the following activities: development of treatment plan with patient and/or surrogate as well as nursing, discussions with consultants, evaluation of patient's response to treatment, examination of patient, obtaining history from patient or surrogate, ordering and performing treatments and interventions, ordering and review of laboratory studies, ordering and review of radiographic studies, pulse oximetry and re-evaluation of patient's condition.   Glynn Octave, MD 10/28/13 [phone]

## 2013-10-29 ENCOUNTER — Ambulatory Visit (HOSPITAL_COMMUNITY)
Admit: 2013-10-29 | Discharge: 2013-10-29 | Disposition: A | Discharge status: Home / Self Care | Payer: Managed Care, Other (non HMO) | Attending: Neurology | Admitting: Neurology

## 2013-10-29 ENCOUNTER — Encounter (HOSPITAL_COMMUNITY): Payer: Self-pay | Admitting: Anesthesiology

## 2013-10-29 ENCOUNTER — Encounter (HOSPITAL_COMMUNITY): Payer: Self-pay | Admitting: *Deleted

## 2013-10-29 DIAGNOSIS — Z8759 Personal history of other complications of pregnancy, childbirth and the puerperium: Secondary | ICD-10-CM

## 2013-10-29 DIAGNOSIS — R209 Unspecified disturbances of skin sensation: Secondary | ICD-10-CM

## 2013-10-29 DIAGNOSIS — R200 Anesthesia of skin: Secondary | ICD-10-CM | POA: Diagnosis present

## 2013-10-29 DIAGNOSIS — R51 Headache: Secondary | ICD-10-CM

## 2013-10-29 HISTORY — DX: Personal history of other complications of pregnancy, childbirth and the puerperium: Z87.59

## 2013-10-29 LAB — COMPREHENSIVE METABOLIC PANEL
BUN: 5 mg/dL — ABNORMAL LOW (ref 6–23)
CO2: 26 mEq/L (ref 19–32)
Calcium: 9.3 mg/dL (ref 8.4–10.5)
Creatinine, Ser: 0.63 mg/dL (ref 0.50–1.10)
GFR calc Af Amer: >90 mL/min (ref >90)
GFR calc non Af Amer: >90 mL/min (ref >90)
Glucose, Bld: 104 mg/dL — ABNORMAL HIGH (ref 70–99)

## 2013-10-29 LAB — URINE CULTURE

## 2013-10-29 MED ORDER — PROCHLORPERAZINE EDISYLATE 5 MG/ML IJ SOLN
10.0000 mg | Freq: Four times a day (QID) | INTRAMUSCULAR | Status: DC | PRN
  Administered 2013-10-29: 10 mg via INTRAVENOUS
  Filled 2013-10-29: qty 2

## 2013-10-29 MED ORDER — OXYCODONE-ACETAMINOPHEN 5-325 MG PO TABS
1.0000 | ORAL_TABLET | ORAL | Status: DC | PRN
  Administered 2013-10-29 – 2013-10-30 (×2): 1 via ORAL
  Filled 2013-10-29 (×2): qty 1

## 2013-10-29 MED ORDER — PNEUMOCOCCAL VAC POLYVALENT 25 MCG/0.5ML IJ INJ
0.5000 mL | INJECTION | INTRAMUSCULAR | Status: AC
  Administered 2013-10-30: 0.5 mL via INTRAMUSCULAR
  Filled 2013-10-29: qty 0.5

## 2013-10-29 MED ORDER — KETOROLAC TROMETHAMINE 30 MG/ML IJ SOLN
30.0000 mg | Freq: Once | INTRAMUSCULAR | Status: AC
  Administered 2013-10-29: 30 mg via INTRAVENOUS
  Filled 2013-10-29: qty 1

## 2013-10-29 MED ORDER — NIFEDIPINE ER 30 MG PO TB24
30.0000 mg | ORAL_TABLET | Freq: Every day | ORAL | Status: DC
  Administered 2013-10-29: 30 mg via ORAL
  Filled 2013-10-29 (×2): qty 1

## 2013-10-29 MED ORDER — LORAZEPAM 2 MG/ML IJ SOLN
1.0000 mg | Freq: Once | INTRAMUSCULAR | Status: AC
  Administered 2013-10-29: 1 mg via INTRAVENOUS
  Filled 2013-10-29: qty 1

## 2013-10-29 MED ORDER — PROCHLORPERAZINE EDISYLATE 5 MG/ML IJ SOLN
10.0000 mg | Freq: Once | INTRAMUSCULAR | Status: AC
  Administered 2013-10-29: 10 mg via INTRAVENOUS
  Filled 2013-10-29: qty 2

## 2013-10-29 NOTE — Progress Notes (Signed)
Anesthesia consult for neurologic complain post epidural  I spoke to Ms. Wasser in detail about her symptoms. She delivered ~ 2 wks ago. Since that time she has had numbness and tingling on her left side (she said both upper and lower extremities). Her LLE she states was slightly weaker than her R shortly after D/C from the hospital post partum, but she has remained ambulatory. She did not describe a dermatomal distribution to me. She has, subjectively, decreased sensation to pressure and pinprick from upper mid thigh down. Again, no discernable dermatomal distribution. No changing in light touch sensation. Motor exam: Left: Hip flexion 5/5, Quad: 4+/5, Knee flex: 4+/5, dorsiflexion: 4+/5, plantar flexion: 5/5. Right: 5/5 throughout. It was hard for me to determine if some of the difference was due to patient effort.  Given these findings I believe a neurology consult is reasonable. Further imaging may be necessary to determine the origin of her symptoms. I discussed possible nerve injury due to stretch or pressure from labor in which I would expect recovery. It is possible this could be related to her epidural, though unlikely. Please let me know if there is anything else I can do to assist.

## 2013-10-29 NOTE — Consult Note (Addendum)
Reason for Consult: Headache/left sided paresthesias Referring Physician: Dr. Juliene Pina  CC: Headache  HPI: Erica Lang is a left handed  37 y.o. female who is approximately 4 weeks out from the birth of her second child. She was discharged home from [facility] on 10/15/2013 and readmitted yesterday 10/28/2013 for evaluation of headache, chest pain, and shortness of breath time several days. The patient also reported some numbness and tingling associated with some mild left-sided weakness following the birth of her child. She feels that this might possibly be related to the epidural.  The numbness and tingling affects mainly her left lower extremity. She has been evaluated by the anesthesiologist who found no obvious cause; although, he did feel that it may have been related to the trauma from the birth itself. She also has occasional numbness and tingling of the fingertips of her left hand which is somewhat inconsistent. The symptoms tend to wax and wane but never resolve completely.  Regarding the patient's headaches she reports that she had similar headaches in her teens and 31s. When these would occur she would lie down in a dark room until they passed. The last time she can remember having a similar headache was after the birth of her daughter approximately 4 years ago. The headaches also wax and wane. She has taken Percocet without any significant relief. Today she received Compazine and Toradol and her pain dropped from a 7 to a 2. Associated symptoms include flashes of light in her visual fields. She also has photophobia and sensitivity to noise. The headaches are also with bending over. They seem to be mainly located in the occipital area but sometimes in the frontal area as well.  This was discussed with Dr. Amada Jupiter who recommends an MRI of the brain to rule out a possible CVA; although he feels this is very unlikely. The patient admits to being claustrophobic and she will receive Ativan  prior to the study  Past Medical History  Diagnosis Date  . Anemia   . Anxiety   . Allergy   . Hypertension   . Abnormal Pap smear   . Hidradenitis   . Obesity   . PCOS (polycystic ovarian syndrome)   . Asthma   . 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 Hx of varicella   . Postpartum hypertension 10/19/2013  . Postpartum edema 10/19/2013    Past Surgical History  Procedure Laterality Date  . Vulva surgery    . Pelvic laparoscopy      Family History  Problem Relation Age of Onset  . Stroke Mother   . Hypertension Mother   . Heart disease Mother   . Deep vein thrombosis Mother     both legs, arms and back, 2 blood clots migrted to her chest area  . Diabetes Mother     Social History:  reports that she has never smoked. She has never used smokeless tobacco. She reports that she does not drink alcohol or use illicit drugs.  Allergies  Allergen Reactions  . Bee Venom Anaphylaxis    Medications:  Scheduled: . LORazepam  1 mg Intravenous Once  . NIFEdipine  30 mg Oral Daily    ROS: History obtained from the patient  General ROS: negative for - chills, fatigue, night sweats, weight gain or weight loss. Fever prior to admission 100.9 Psychological ROS: negative for - behavioral disorder, hallucinations, memory difficulties, mood swings or suicidal ideation. Increased stress and lack of sleep following the birth of her child.  Ophthalmic ROS: negative for - blurry vision, double vision, eye pain or loss of vision. Positive for flashes of light in her visual fields associated with her headaches ENT ROS: negative for - epistaxis, nasal discharge, oral lesions, sore throat, tinnitus or vertigo Allergy and Immunology ROS: negative for - hives or itchy/watery eyes Hematological and Lymphatic ROS: negative for - bleeding problems, bruising or swollen lymph nodes Endocrine ROS: negative for - galactorrhea, hair pattern changes, polydipsia/polyuria or temperature  intolerance Respiratory ROS: negative for - cough, hemoptysis, shortness of breath or wheezing Cardiovascular ROS: negative for -  dyspnea on exertion, edema or irregular heartbeat. Positive for recent chest pain shortness of breath. Gastrointestinal ROS: negative for - abdominal pain, diarrhea, hematemesis, nausea/vomiting or stool incontinence Genito-Urinary ROS: negative for - dysuria, hematuria, incontinence or urinary frequency/urgency Musculoskeletal ROS: negative for - joint swelling or muscular weakness Neurological ROS: as noted in HPI Dermatological ROS: negative for rash and skin lesion changes   Physical Examination: Blood pressure 140/79, pulse 63, temperature 98.4 F (36.9 C), temperature source Oral, resp. rate 16, height 5\' 9"  (1.753 m), weight 108.001 kg (238 lb 1.6 oz), last menstrual period 01/13/2013, SpO2 100.00%, currently breastfeeding. Gen: In bed, NAD Neurologic Examination Mental Status: Alert, oriented, thought content appropriate.  Speech fluent without evidence of aphasia.  Able to follow 3 step commands without difficulty. Flat affect. Cranial Nerves: II: Discs not visualized; Visual fields grossly normal, pupils equal, round, reactive to light and accommodation III,IV, VI: ptosis not present, extra-ocular motions intact bilaterally V,VII: smile symmetric, facial light touch sensation normal bilaterally VIII: hearing normal bilaterally IX,X: gag reflex present XI: bilateral shoulder shrug XII: midline tongue extension Motor: 5/5 throughout with occasional inconsistencies likely related to the patient's effort. Tone and bulk:normal tone throughout; no atrophy noted Sensory: Pinprick and light touch intact throughout, bilaterally Deep Tendon Reflexes: 2+ and symmetric throughout Plantars: Right: downgoing   Left: downgoing Cerebellar: normal finger-to-nose, normal rapid alternating movements. Gait: Deferred CV: pulses palpable throughout   Laboratory  Studies:   Basic Metabolic Panel:  Recent Labs Lab 10/28/13 1255  NA 142  K 3.3*  CL 105  CO2 26  GLUCOSE 83  BUN 7  CREATININE 0.67  CALCIUM 9.2    Liver Function Tests:  Recent Labs Lab 10/28/13 1332  AST 38*  ALT 51*  ALKPHOS 83  BILITOT 0.5  PROT 6.8  ALBUMIN 3.4*   No results found for this basename: LIPASE, AMYLASE,  in the last 168 hours No results found for this basename: AMMONIA,  in the last 168 hours  CBC:  Recent Labs Lab 10/28/13 1255  WBC 5.2  HGB 12.2  HCT 35.2*  MCV 94.9  PLT 228    Cardiac Enzymes: No results found for this basename: CKTOTAL, CKMB, CKMBINDEX, TROPONINI,  in the last 168 hours  BNP: No components found with this basename: POCBNP,   CBG: No results found for this basename: GLUCAP,  in the last 168 hours  Microbiology: Results for orders placed during the hospital encounter of 10/28/13  URINE CULTURE     Status: None   Collection Time    10/28/13  2:15 PM      Result Value Range Status   Specimen Description URINE, CLEAN CATCH   Final   Special Requests NONE   Final   Culture  Setup Time     Final   Value: 10/28/2013 15:58     Performed at [facility] Count  Final   Value: NO GROWTH     Performed at [facility]   Culture     Final   Value: NO GROWTH     Performed at [facility]   Report Status 10/29/2013 FINAL   Final  MRSA PCR SCREENING     Status: None   Collection Time    10/28/13  8:35 PM      Result Value Range Status   MRSA by PCR NEGATIVE  NEGATIVE Final   Comment:            The GeneXpert MRSA Assay (FDA     approved for NASAL specimens     only), is one component of a     comprehensive MRSA colonization     surveillance program. It is not     intended to diagnose MRSA     infection nor to guide or     monitor treatment for     MRSA infections.    Coagulation Studies: No results found for this basename: LABPROT, INR,  in the last 72 hours  Urinalysis:   Recent Labs Lab 10/28/13 1415  COLORURINE YELLOW  LABSPEC 1.009  PHURINE 7.0  GLUCOSEU NEGATIVE  HGBUR LARGE*  BILIRUBINUR NEGATIVE  KETONESUR NEGATIVE  PROTEINUR NEGATIVE  UROBILINOGEN 0.2  NITRITE NEGATIVE  LEUKOCYTESUR LARGE*    Lipid Panel:  No results found for this basename: chol, trig, hdl, cholhdl, vldl, ldlcalc    HgbA1C:  No results found for this basename: HGBA1C    Urine Drug Screen:   No results found for this basename: labopia, cocainscrnur, labbenz, amphetmu, thcu, labbarb    Alcohol Level: No results found for this basename: ETH,  in the last 168 hours  Other results: EKG: Sinus Bradycardia rate 51 beats per minute  Imaging:  Dg Chest 2 View 10/28/2013   No active disease. No significant change.    Ct Head Wo Contrast 10/28/2013    Normal appearing CT head without contrast. No evidence for intracranial hemorrhage or CT signs of eclampsia. No evidence of venous infarction.      Ct Angio Chest W/cm &/or Wo Cm 10/28/2013    No evidence of pulmonary embolism.  Borderline cardiomegaly.  Three vessel coronary atherosclerosis, age advanced. Severity of stenosis cannot be assessed on this nongated CT. Clinical correlation is suggested.        Assessment/Plan:  37 year old female approximately 2 weeks after the birth of her daughter who was readmitted to [facility] with multiple symptoms including chest pain, shortness of breath, headache, and numbness and tingling of the left lower extremity and hand. This case was discussed with Dr. Amada Jupiter and Dr. Juliene Pina and a decision was made to proceed with an MRI of the brain to rule out a possible stroke or other etiology for her symptoms.  Erica See PA-C [facility] Pager [phone] 10/29/2013, 3:43 PM   I have seen and evaluated the patient. I have reviewed the above note and made appropriate changes. Left arm and leg numbness and tingling, likely related to migraine, however  Thalamic strkoe can sometimes have symptoms such as this and would also take a look at her C-Spine.  If these are negative, then I think that no further evaluation would be needed.   Compazine seemed to help and if she is discharged could use PRN PO compazine as an abortive. Also, could consider gabapentin 300mg  TID x 1 week to try to calm down her headache.   Erica Slot,  MD [facility] [phone]  If 7pm- 7am, please page neurology on call at [phone].

## 2013-10-29 NOTE — Progress Notes (Signed)
Pt transported to [facility] for MRI of the brain. 1mg  Ativan given IV prior to transport d/t pt's c/o claustrophobia. VSS  upon leaving.

## 2013-10-29 NOTE — Progress Notes (Signed)
Patient ID: Erica Lang, female   DOB: Feb 04, 1976, 37 y.o.   MRN: [id number] Subjective: Slept well last night and this morning after breakfast. HAs with flashes of light occasionally, are off and on but on further questioning, these are similar to HAs she's had for many years, not sure if menstrual but had them after last childbirth, had some before this preg, during pregnancy and used to take Hydrocodone for it. Has not seen a Neurologist for it and does not have an Internist/PCP. Neurology on consult. MRI ordered.  Chest pain and SOB not much, now talks more about left leg tingling and may be left arm tinging, started few days after delivery but didn't think about it much until this admission. No facial tingling. Had vaginal birth with epidural. S/p Anesthesia consult, seems vague and not dermatome specific, possible pregnancy or position related. Will have Neuro assess.  No nausea/vomiting/ fever. Is not breast feeding (not able to much) since low milk production, offered breast pump use while here to keep up since baby at home. Pt will decide later.   Objective: Vital signs in last 24 hours: Temp:  [98 F (36.7 C)-98.8 F (37.1 C)] 98.4 F (36.9 C) (11/01 1200) Pulse Rate:  [59-87] 87 (11/01 1500) Resp:  [15-25] 16 (11/01 1400) BP: (119-173)/(59-83) 126/66 mmHg (11/01 1500) SpO2:  [97 %-100 %] 100 % (11/01 1500) Weight:  [238 lb 1.6 oz (108.001 kg)] 238 lb 1.6 oz (108.001 kg) (10/31 2021) Weight change:  Last BM Date: 10/27/13  Intake/Output from previous day: 10/31 0701 - 11/01 0700 In: 2036 [P.O.:720; I.V.:1316] Out: 2100 [Urine:2100] Intake/Output this shift: Total I/O In: 1817.7 [P.O.:960; I.V.:857.7] Out: 1000 [Urine:1000]  A&O x 3, no acute distress. pleasant HEENT neg Lungs CTA bilat CV RRR, S1S2 normal (baseline HR in 60s but increased to 80-90s during conversation and when leaning forward for lung exam) Abdo soft, non tender, non acute Extr no edema/ tenderness, SCDs  on.    Medications:  IVLR, Nifedipine 30mg  XL, Toradol 30mg  IV x 1 with Compazine 10 mg IV once.  Assessment/Plan: 37 yo with HAs, left leg tingling, S/p Neuro and Anesthesia eval, CT head (plain) neg, MRI ordered. Likely migraines. Await Neuro input. SOB/CP - unexplained since BPs not very high (except one 180s/90s in ED yesterday), but improved today. S/p EKG, 2D echo and CT chest neg for PE. COntinue to control HTN, will set up outpatient f/up with cardiology esp considering "atherosclerotic changes" noted on CT angio.  Postpartum - stable, no depression. Lactation assistance if desired or ice and bind to suppress.  Anticipate D/c home tomorrow.   Chipper Koudelka R 10/29/2013, 3:56 PM

## 2013-10-29 NOTE — Plan of Care (Signed)
Problem: Consults Goal: Neurology consult Outcome: Completed/Met Date Met:  10/29/13 Dr. Amada Jupiter Goal: Cardiology consult Outcome: Completed/Met Date Met:  10/28/13 Per documentation

## 2013-10-30 DIAGNOSIS — O1490 Unspecified pre-eclampsia, unspecified trimester: Secondary | ICD-10-CM

## 2013-10-30 DIAGNOSIS — I10 Essential (primary) hypertension: Secondary | ICD-10-CM | POA: Diagnosis present

## 2013-10-30 MED ORDER — NIFEDIPINE ER 60 MG PO TB24: 60.0000 mg | ORAL_TABLET | Freq: Every day | ORAL | Status: DC

## 2013-10-30 MED ORDER — NIFEDIPINE ER 60 MG PO TB24
60.0000 mg | ORAL_TABLET | Freq: Every day | ORAL | Status: DC
  Administered 2013-10-30: 60 mg via ORAL
  Filled 2013-10-30 (×2): qty 1

## 2013-10-30 MED ORDER — PROCHLORPERAZINE MALEATE 10 MG PO TABS: 10.0000 mg | ORAL_TABLET | Freq: Three times a day (TID) | ORAL | Status: DC | PRN

## 2013-10-30 MED ORDER — POTASSIUM CHLORIDE CRYS ER 20 MEQ PO TBCR: 20.0000 meq | EXTENDED_RELEASE_TABLET | Freq: Every day | ORAL | Status: DC

## 2013-10-30 MED ORDER — POTASSIUM CHLORIDE CRYS ER 20 MEQ PO TBCR
20.0000 meq | EXTENDED_RELEASE_TABLET | Freq: Two times a day (BID) | ORAL | Status: DC
  Administered 2013-10-30: 20 meq via ORAL
  Filled 2013-10-30 (×3): qty 1

## 2013-10-30 NOTE — Progress Notes (Signed)
Discharged home via W/C, accompanied by RN and husband. VSS and pt without complaints upon leaving.

## 2013-10-30 NOTE — Discharge Summary (Signed)
Physician Discharge Summary  Patient ID: Erica Lang MRN: [id number] DOB/AGE: [birth year] 39 y.o.  Admit date: 10/28/2013 Discharge date: 10/30/2013  Admission Diagnoses:  Postpartum Hypertension                                          Chest pain                                          Shortness of breath                                          Headaches                                          Left leg tingling  Discharge Diagnoses:  Principal Problem:   Persistent headaches Active Problems:   Left sided numbness  Discharged Condition: good  Hospital Course:  37 y.o. AA female, [id number], Postpartum 2 wks after vaginal delivery. Has h/o Preeclampsia in first pregnancy with postpartum fluid overload and needed diuretics. BP were normal in this pregnancy and immediate postpartum. She was seen 1 wk back with HA, SOB, had negative CXR, PIH labs normal and was discharged with Lasix. She returned to office 10/30 with same complaints, BP was elevated and was given Labetalol Rx but didn't start. She came back to office on 10/31 with persistent symptoms of HA (all over) but also left leg and arm pain/ tingling and SOB, CP and occasional spots in front of her eyes. She was sent to [facility] ED for evaluation with EKG/ CXR/ 2D Echo. She also head plain head CT and Chest CT angio, all essentially negative for acute findings and she was brought back to [facility] hospital for BP management and evaluate neurologic symptoms.  Neurology consult, MRI head, neck were negative and current diagnosis is migraine headaches.  BP was controlled with Nifedipine (deferring beta-blockers due to sinus bradycardia). CP/SOB have entirely resolved. Patient improved and will be discharged home.   Consults: neurology - [facility] (Dr Amada Jupiter)  Significant Diagnostic Studies:  CXR -Neg EKG - sinus bradycardia but otherwise ok, Troponin neg in ED on 10/31 2D Echo of heart - normal EF, normal walls, mild MR  and AR - will see Cardiology outpatient CT angio chest - neg for Pulmonary embolism but notes atherosclerotic changes in coronaries and will have Cardiology do further testing as needed CT head (plain), MRI head and neck spine - all negative  Treatments:  Magnesium sulphate for about 12 hrs (until urine protein/ creatinine ratio was neg, no preeclampsia) Hydralazine 10 mg once in ED, IV morphine for HA in ED Nifedipine 60mg  XL daily Compazine 10mg  as needed / 6hrs for HA and Toradol IV for HA, Percocet prn for HA Potassium replacement with PO K-dur.  Discharge Exam: Blood pressure 127/69, pulse 60, temperature 98.9 F (37.2 C), temperature source Oral, resp. rate 16, height 5\' 9"  (1.753 m), weight 233 lb 6.4 oz (105.87 kg), last menstrual period 01/13/2013, SpO2 99.00%, currently breastfeeding. A&O x 3, no acute distress. pleasant  HEENT neg  Lungs CTA bilat  CV RRR, S1S2 normal with slight murmur.  Abdo soft, non tender, non acute  Extr no edema/ tenderness, SCDs on.  Disposition: 01-Home or Self Care  Discharge Orders   Future Orders Complete By Expires   Call MD for:  difficulty breathing, headache or visual disturbances  As directed    Call MD for:  extreme fatigue  As directed    Call MD for:  hives  As directed    Call MD for:  persistant dizziness or light-headedness  As directed    Call MD for:  persistant nausea and vomiting  As directed    Call MD for:  severe uncontrolled pain  As directed    Call MD for:  temperature >100.4  As directed    Call MD for:  As directed    Comments:     Worsening headaches, return of shortness of breath or chest pain, shortness of breath at rest, worsening swelling   Diet - low sodium heart healthy  As directed    Discharge instructions  As directed    Comments:     Needs to see ObGyn Dr Jorene Minors office with MD visit and BP check on 11/01/13 or 11/02/13 Will need outpatient Cardiology and Neurology visits. Will have our  [employer]  office staff help set that up   Driving Restrictions  As directed    Comments:     2 wks   Increase activity slowly  As directed    Lifting restrictions  As directed    Comments:     4  wks   Sexual Activity Restrictions  As directed    Comments:     4 wks       Medication List    STOP taking these medications       furosemide 40 MG tablet  Commonly known as:  LASIX      TAKE these medications       albuterol 108 (90 BASE) MCG/ACT inhaler  Commonly known as:  PROVENTIL HFA;VENTOLIN HFA  Inhale 2 puffs into the lungs every 6 (six) hours as needed for wheezing.     docusate sodium 100 MG capsule  Commonly known as:  COLACE  Take 100 mg by mouth 2 (two) times daily.     EPINEPHrine 0.3 mg/0.3 mL Devi  Commonly known as:  EPI-PEN  Inject 0.3 mLs (0.3 mg total) into the muscle once.     ibuprofen 600 MG tablet  Commonly known as:  ADVIL,MOTRIN  Take 1 tablet (600 mg total) by mouth every 6 (six) hours.     NIFEdipine 60 MG 24 hr tablet  Commonly known as:  PROCARDIA-XL/ADALAT CC  Take 1 tablet (60 mg total) by mouth daily.     oxyCODONE-acetaminophen 5-325 MG per tablet  Commonly known as:  PERCOCET/ROXICET  Take 1-2 tablets by mouth every 6 (six) hours as needed for pain.     polyethylene glycol powder powder  Commonly known as:  GLYCOLAX/MIRALAX  Take 17 g by mouth daily.     potassium chloride SA 20 MEQ tablet  Commonly known as:  K-DUR,KLOR-CON  Take 1 tablet (20 mEq total) by mouth daily.     prenatal multivitamin Tabs tablet  Take 1 tablet by mouth daily at 12 noon.     prochlorperazine 10 MG tablet  Commonly known as:  COMPAZINE  Take 1 tablet (10 mg total) by mouth every 8 (eight) hours as needed.  Follow-up Information   Follow up with Lenoard Aden, MD. (on 11/4 or 11/5 for BP check and MD short f/up)    Specialty:  Obstetrics and Gynecology   Contact information:   [address] [place] [address] [phone]        Follow up with [facility]. (Will have office schedule referrals)    Specialty:  Cardiology   Contact information:   [address] [place] [address] [phone]      Follow up with [facility]. (Will have office schedule f/up in 1-2 wks)    Specialty:  Neurology   Contact information:   [address] [place], [address] [place] [address] [phone]     Signed: Robley Fries 10/30/2013, 12:32 PM

## 2013-10-30 NOTE — Progress Notes (Signed)
Patient ID: Erica Lang, female   DOB: December 06, 1976, 37 y.o.   MRN: [id number] Subjective: Slept well. Denies CP/SOB. HA are still present and tried Compazine again last night without much relief and asked for Percocet which helped. Ambulating without pain/SOB/diziness/leg weakness. Eating and voiding without difficulty. Is eager to get discharged. Husband here this morning.   Objective: BP 155/73  Pulse 60  Temp(Src) 98.9 F (37.2 C) (Oral)  Resp 18  Ht 5\' 9"  (1.753 m)  Wt 233 lb 6.4 oz (105.87 kg)  BMI 34.45 kg/m2  SpO2 99%  LMP 01/13/2013  Breastfeeding? Yes BP have increased since 6 am in 150s/80s.   A&O x 3, no acute distress. pleasant HEENT neg Lungs CTA bilat CV RRR, S1S2 normal with slight murmur. Abdo soft, non tender, non acute Extr no edema/ tenderness, SCDs on.   Medications:  Nifedipine 30mg  XL--> increased to 60mg  XL this morning Compazine 10 mg last night. Percocet 1 as needed (about every 6-8 hrs)  Started K replacement with PO this morning  Assessment/Plan: 37 yo with HAs, left leg and arm tingling, SOB and CP at admission. HD #3. Feels much better.   CP/ SOB - have resolved. HTN-  Continue BP control, increased Proacardia XL to 60 mg daily. Will have her f/up with outpatient cardiology to reassess heart, esp for "atherosclerotic changes" noted on CT angio and Mild MR/AR on 2D echo and sinus bradycardia on EKG. CT chest neg for PE.  HA and tingling- not resolved and may not be resolved during this hospitalization. Will schedule outpatient care with Neurology. She is s/p Neuro and Anesthesia eval, CT head (plain) neg, MRI (head and cervical spine) neg. Likely migraines. Per Neuro avoid using narcotics for HAs. Recommend Compazine and consider Gabapentin. Needs to address that outpatient.   Postpartum - stable, no depression. Lactation failure per patient, recommend Fenugreek but not adding any medication to help.    Reviewed all tests and input from consultant.  At present all tests are good and she is ok to be discharged but will need to work closely with specialists and plan to get her a PCP as well.  Anticipate D/c home today after reviewing BP control with new dose.   F/up in office with Dr Billy Coast and BP check in 48 hrs. F/up with Cardiology and Neurology outpatient in 1-2 wks.  D/c meds- Procardia XL 60mg  daily. K-dur 20 mEq daily for 1 wks, check K+ level in office in 1 wk. Compazine 10mg  PO tid as needed.    Erica Lang R 10/30/2013, 10:54 AM

## 2013-10-31 ENCOUNTER — Encounter: Payer: Self-pay | Admitting: Cardiovascular Disease

## 2013-10-31 DIAGNOSIS — E669 Obesity, unspecified: Secondary | ICD-10-CM | POA: Insufficient documentation

## 2013-10-31 DIAGNOSIS — I10 Essential (primary) hypertension: Secondary | ICD-10-CM | POA: Insufficient documentation

## 2013-10-31 DIAGNOSIS — J45909 Unspecified asthma, uncomplicated: Secondary | ICD-10-CM | POA: Insufficient documentation

## 2013-10-31 DIAGNOSIS — F419 Anxiety disorder, unspecified: Secondary | ICD-10-CM | POA: Insufficient documentation

## 2013-10-31 DIAGNOSIS — E282 Polycystic ovarian syndrome: Secondary | ICD-10-CM | POA: Insufficient documentation

## 2013-10-31 DIAGNOSIS — N979 Female infertility, unspecified: Secondary | ICD-10-CM | POA: Insufficient documentation

## 2013-10-31 DIAGNOSIS — Z8619 Personal history of other infectious and parasitic diseases: Secondary | ICD-10-CM | POA: Insufficient documentation

## 2013-10-31 DIAGNOSIS — D649 Anemia, unspecified: Secondary | ICD-10-CM | POA: Insufficient documentation

## 2013-10-31 DIAGNOSIS — L732 Hidradenitis suppurativa: Secondary | ICD-10-CM | POA: Insufficient documentation

## 2013-11-01 ENCOUNTER — Ambulatory Visit (INDEPENDENT_AMBULATORY_CARE_PROVIDER_SITE_OTHER): Payer: Managed Care, Other (non HMO) | Admitting: Cardiovascular Disease

## 2013-11-01 VITALS — BP 130/90 | HR 80 | Ht 69.0 in | Wt 235.8 lb

## 2013-11-01 DIAGNOSIS — R079 Chest pain, unspecified: Secondary | ICD-10-CM

## 2013-11-01 NOTE — Patient Instructions (Signed)

## 2013-11-01 NOTE — Assessment & Plan Note (Signed)
Atypical Normal ECG  Small amount of calcium on CT F/U ETT

## 2013-11-01 NOTE — Progress Notes (Signed)
Patient ID: Erica Lang, female   DOB: 11/27/1976, 37 y.o.   MRN: [id number] 37 y.o. AA female, [id number], Postpartum 2 wks after vaginal delivery. Has h/o Preeclampsia in first pregnancy with postpartum fluid overload and needed diuretics. BP were normal in this pregnancy and immediate postpartum. She was seen 1 wk back with HA, SOB, had negative CXR, PIH labs normal and was discharged with Lasix. She returned to office 10/30 with same complaints, BP was elevated and was given Labetalol Rx but didn't start. She came back to office on 10/31 with persistent symptoms of HA (all over) but also left leg and arm pain/ tingling and SOB, CP and occasional spots in front of her eyes. She was sent to [facility] ED for evaluation with EKG/ CXR/ 2D Echo. She also head plain head CT and Chest CT angio, all essentially negative for acute findings and she was brought back to [facility] hospital for BP management and evaluate neurologic symptoms.  Neurology consult, MRI head, neck were negative and current diagnosis is migraine headaches.  BP was controlled with Nifedipine (deferring beta-blockers due to sinus bradycardia). CP/SOB have entirely resolved. Patient improved and will be discharged home  CT done at [facility] 10/31 to r/o PE reviewed with patient Two small areas of coronary calcification one in LAD and one in circumflex  Echo 10/28/13 normal Study Conclusions  - Left ventricle: The cavity size was normal. Wall thickness was normal. Systolic function was normal. The estimated ejection fraction was in the range of 60% to 65%. - Aortic valve: Mild regurgitation. - Mitral valve: Mild regurgitation. - Pulmonary arteries: PA peak pressure: 35mm Hg (S).  ROS: Denies fever, malais, weight loss, blurry vision, decreased visual acuity, cough, sputum, SOB, hemoptysis, pleuritic pain, palpitaitons, heartburn, abdominal pain, melena, lower extremity edema, claudication, or rash.  All other systems reviewed and  negative   General: Affect appropriate Healthy:  appears stated age HEENT: normal Neck supple with no adenopathy JVP normal no bruits no thyromegaly Lungs clear with no wheezing and good diaphragmatic motion Heart:  S1/S2 no murmur,rub, gallop or click PMI normal Abdomen: benighn, BS positve, no tenderness, no AAA no bruit.  No HSM or HJR Distal pulses intact with no bruits No edema Neuro non-focal Skin warm and dry No muscular weakness  Medications Current Outpatient Prescriptions  Medication Sig Dispense Refill  . albuterol (PROVENTIL HFA;VENTOLIN HFA) 108 (90 BASE) MCG/ACT inhaler Inhale 2 puffs into the lungs every 6 (six) hours as needed for wheezing.  1 Inhaler  0  . docusate sodium (COLACE) 100 MG capsule Take 100 mg by mouth 2 (two) times daily.      Marland Kitchen EPINEPHrine (EPI-PEN) 0.3 mg/0.3 mL DEVI Inject 0.3 mLs (0.3 mg total) into the muscle once.  1 Device  0  . ibuprofen (ADVIL,MOTRIN) 600 MG tablet Take 1 tablet (600 mg total) by mouth every 6 (six) hours.  30 tablet  0  . NIFEdipine (PROCARDIA-XL/ADALAT CC) 60 MG 24 hr tablet Take 1 tablet (60 mg total) by mouth daily.  30 tablet  0  . oxyCODONE-acetaminophen (PERCOCET/ROXICET) 5-325 MG per tablet Take 1-2 tablets by mouth every 6 (six) hours as needed for pain.  30 tablet  0  . polyethylene glycol powder (GLYCOLAX/MIRALAX) powder Take 17 g by mouth daily.      . potassium chloride SA (K-DUR,KLOR-CON) 20 MEQ tablet Take 1 tablet (20 mEq total) by mouth daily.  14 tablet  0  . Prenatal Vit-Fe Fumarate-FA (PRENATAL MULTIVITAMIN) TABS tablet Take  1 tablet by mouth daily at 12 noon.      . prochlorperazine (COMPAZINE) 10 MG tablet Take 1 tablet (10 mg total) by mouth every 8 (eight) hours as needed.  30 tablet  0   No current facility-administered medications for this visit.    Allergies Bee venom  Family History: Family History  Problem Relation Age of Onset  . Stroke Mother   . Hypertension Mother   . Heart disease  Mother   . Deep vein thrombosis Mother     both legs, arms and back, 2 blood clots migrted to her chest area  . Diabetes Mother     Social History: History   Social History  . Marital Status: Married    Spouse Name: N/A    Number of Children: N/A  . Years of Education: N/A   Occupational History  . Not on file.   Social History Main Topics  . Smoking status: Never Smoker   . Smokeless tobacco: Never Used  . Alcohol Use: No  . Drug Use: No  . Sexual Activity: Yes   Other Topics Concern  . Not on file   Social History Narrative  . No narrative on file    Electrocardiogram:  11/2  NSR rate 51 normal   Assessment and Plan

## 2013-11-01 NOTE — Assessment & Plan Note (Signed)
Well controlled.  Continue current medications and low sodium Dash type diet.    

## 2013-11-10 ENCOUNTER — Encounter: Payer: Self-pay | Admitting: *Deleted

## 2013-11-10 ENCOUNTER — Encounter: Payer: Self-pay | Admitting: Neurology

## 2013-11-10 ENCOUNTER — Ambulatory Visit (INDEPENDENT_AMBULATORY_CARE_PROVIDER_SITE_OTHER): Payer: Managed Care, Other (non HMO) | Admitting: Neurology

## 2013-11-10 ENCOUNTER — Ambulatory Visit: Payer: Managed Care, Other (non HMO) | Admitting: Neurology

## 2013-11-10 VITALS — BP 122/72 | HR 60 | Temp 99.0°F | Resp 12 | Ht 70.0 in | Wt 232.0 lb

## 2013-11-10 DIAGNOSIS — R51 Headache: Secondary | ICD-10-CM

## 2013-11-10 DIAGNOSIS — G43909 Migraine, unspecified, not intractable, without status migrainosus: Secondary | ICD-10-CM

## 2013-11-10 NOTE — Progress Notes (Signed)
Erica Lang was seen today in neurologic consultation.  The patient was seen in consultation at the request of Dr. Cherly Lang. The consultation is for the evaluation of headache.   The patient is a 37 y.o. year old female with a history of headache.  The patient reports that she had headaches years ago but they suddenly went away.  They came back just over a year ago. She had headaches the entire way through the pregnancy.   She is currently one month post partum and states that headaches are severe. She states that the headache now is the same she has always had.   She was seen post partum at the hospital.  She was placed on BP medication.  She had an MRI brain and C-spine, both of which were unremarkable.  She was evaluated by Dr. Amada Lang in the hospital.  The headaches are at the vertex and radiate posteriorally or they are in the frontal region.  They are pressure like and squeezing when on the top of the head and more piercing in the frontal region.  She has some nausea with the headaches and may/may not have emesis.  She has photophobia and phonophobia.  She has had a headache every day this week and they are often lasting all day.  She doesn't associate the headache with changes in position or valsalva maneuver.  She has some shooting lights in the visual field with the headache.  She gained about 50 lbs with the pregnancy and she has taken off about 25 pds of that.  She is currently taking compazine for headache and she is taking it 2-3 times per day.  It is not helping.  She has never been on a preventative medication for headache.    MRI brain: 10/29/13:  CLINICAL DATA: Postpartum 2 weeks. Recent headache chest pain and  shortness of breath. History of pre-eclampsia. Elevated blood  pressure.  EXAM:  MRI HEAD WITHOUT CONTRAST  TECHNIQUE:  Multiplanar, multisequence MR imaging was performed. No intravenous  contrast was administered.  COMPARISON: CT head 10/28/2013.  FINDINGS:  No  evidence for acute infarction, hemorrhage, mass lesion,  hydrocephalus, or extra-axial fluid. No secondary signs of eclampsia  in the cerebellum or parieto-occipital white matter. Flow voids are  maintained in the internal carotid arteries, basilar artery,  vertebral arteries, and visualized major dural venous sinuses. No  osseous findings. No evidence for pituitary apoplexy. 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 No  atrophy, remote large vessel infarct, or white matter disease. Scalp  and extracranial soft tissues appear unremarkable. No sinus or  mastoid fluid. Negative orbits.  IMPRESSION:  Negative cranial MRI. No MR findings to suggest complication of  pregnancy. No restricted diffusion or vasogenic edema to suggest  preeclampsia.   PREVIOUS MEDICATIONS: Not applicable  ALLERGIES:   Allergies  Allergen Reactions  . Bee Venom Anaphylaxis    CURRENT MEDICATIONS:  Current Outpatient Prescriptions on File Prior to Visit  Medication Sig Dispense Refill  . albuterol (PROVENTIL HFA;VENTOLIN HFA) 108 (90 BASE) MCG/ACT inhaler Inhale 2 puffs into the lungs every 6 (six) hours as needed for wheezing.  1 Inhaler  0  . docusate sodium (COLACE) 100 MG capsule Take 100 mg by mouth 2 (two) times daily.      Marland Kitchen EPINEPHrine (EPI-PEN) 0.3 mg/0.3 mL DEVI Inject 0.3 mLs (0.3 mg total) into the muscle once.  1 Device  0  . ibuprofen (ADVIL,MOTRIN) 600 MG tablet Take 1 tablet (600 mg total) by  mouth every 6 (six) hours.  30 tablet  0  . NIFEdipine (PROCARDIA-XL/ADALAT CC) 60 MG 24 hr tablet Take 1 tablet (60 mg total) by mouth daily.  30 tablet  0  . polyethylene glycol powder (GLYCOLAX/MIRALAX) powder Take 17 g by mouth daily.      . potassium chloride SA (K-DUR,KLOR-CON) 20 MEQ tablet Take 1 tablet (20 mEq total) by mouth daily.  14 tablet  0  . Prenatal Vit-Fe Fumarate-FA (PRENATAL MULTIVITAMIN) TABS tablet Take 1 tablet by mouth daily at 12 noon.      .  prochlorperazine (COMPAZINE) 10 MG tablet Take 1 tablet (10 mg total) by mouth every 8 (eight) hours as needed.  30 tablet  0   No current facility-administered medications on file prior to visit.    PAST MEDICAL HISTORY:   Past Medical History  Diagnosis Date  . Anemia   . Anxiety   . Allergy   . Hypertension   . Abnormal Pap smear   . Hidradenitis   . Obesity   . PCOS (polycystic ovarian syndrome)   . Asthma   . 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 Hx of varicella   . Postpartum hypertension 10/19/2013  . Postpartum edema 10/19/2013    PAST SURGICAL HISTORY:   Past Surgical History  Procedure Laterality Date  . Vulva surgery    . Pelvic laparoscopy      SOCIAL HISTORY:   History   Social History  . Marital Status: Married    Spouse Name: N/A    Number of Children: N/A  . Years of Education: N/A   Occupational History  .      [employer]   Social History Main Topics  . Smoking status: Never Smoker   . Smokeless tobacco: Never Used  . Alcohol Use: No  . Drug Use: No  . Sexual Activity: Yes   Other Topics Concern  . Not on file   Social History Narrative  . No narrative on file    FAMILY HISTORY:   Family Status  Relation Status Death Age  . Mother Alive 64    stroke with L paralysis  . Father      unknown med health  . Brother Alive     2, healthy  . Brother Deceased     1, birth (only lived 3 weeks)  . Sister Alive     5, healthy  . Sister Deceased     1, murder  . Child Alive     2, alive and well    ROS:  A complete 10 system review of systems was obtained and was unremarkable apart from what is mentioned above.  PHYSICAL EXAMINATION:    VITALS:   Filed Vitals:   11/10/13 1317  BP: 122/72  Pulse: 60  Temp: 99 F (37.2 C)  Resp: 12  Height: 5\' 10"  (1.778 m)  Weight: 232 lb (105.235 kg)    GEN:  Appears stated age and in NAD. HEENT:  Normocephalic, atraumatic. The mucous membranes are moist. The  superficial temporal arteries are without ropiness or tenderness. Cardiovascular: Regular rate and rhythm. Lungs: Clear to auscultation bilaterally. Neck: There are no carotid bruits noted bilaterally.  NEUROLOGICAL: Orientation:  Pt is alert and oriented x 3.  Fund of knowledge is appropriate.  Recent and remote memory intact.  Attention and concentration normal.  Able to repeat and name. Cranial nerves: There is good facial symmetry. The pupils are equal round and reactive to  light bilaterally. Funduscopic exam reveals clear disc margins bilaterally. Extraocular muscles are intact and visual fields are full to confrontational testing. Speech is fluent and clear. Soft palate rises symmetrically and there is no tongue deviation. Hearing is intact to conversational tone. Tone: Tone is good throughout. Sensation: Sensation is intact to light touch and pinprick throughout (facial, extremities, trunk). Vibration is intact at the bilateral big toe. There is no extinction with double simultaneous stimulation. There is no sensory dermatomal level identified. Coordination:  The patient has no difficulty with RAM's or FNF bilaterally. Motor: Strength is 5/5 in the bilateral upper and lower extremities. Shoulder shrug is equal and symmetric.  There is no pronator drift.  There are no fasciculations noted. DTR's: Deep tendon reflexes are 2/4 at the bilateral biceps, triceps, brachioradialis, patella and achilles.  Plantar responses are downgoing bilaterally. Gait and Station: The patient is able to ambulate without difficulty. The patient is able to heel toe walk without any difficulty. The patient is able to ambulate in a tandem fashion. The patient is able to stand in the Romberg position.   IMPRESSIONS/PLAN:  1.  Headache.  This is most characteristic of chronic daily headache, with superimposed migraine.    -Although central venous thrombosis is in the differential diagnosis, given that she is in the  immediate postpartum period, she reports that this headache is the same headache that she has been dealing with, even 3 pregnancy.  We decided to hold off on an MRV.  MRI of the brain and cervical spine was unremarkable.  -Although I did not see papilledema, I would like her to have a dilated eye exam.  I will refer her to [facility] ophthalmology.  -I. would like to start the patient on a preventative medication, and think that Topamax would be a good choice.  It is fairly safe in breast-feeding, but also could help her lose weight and if there is an increased intracranial pressure component, it would help as well.  I sent a letter to Dr. Helyn Numbers, given that is the baby's pediatrician and the patient is breast feeding, to get approval for this medication.  In anticipation of potentially starting the medication, the patient and I did fully talk about risk/benefit/side effects to both the patient and the baby.  She expressed understanding.  She would like to start the medication.  We will wait for approval from the pediatrician.  -If we are able to get headaches under better control, then we can use the Imitrex nasal spray while she is breast-feeding for breakthrough headaches.  -Plan on seeing the patient back in the next few months, sooner should new neurologic issues arise.

## 2013-11-11 ENCOUNTER — Ambulatory Visit (INDEPENDENT_AMBULATORY_CARE_PROVIDER_SITE_OTHER): Payer: Managed Care, Other (non HMO) | Admitting: Physician Assistant

## 2013-11-11 ENCOUNTER — Encounter: Payer: Self-pay | Admitting: Cardiovascular Disease

## 2013-11-11 DIAGNOSIS — R079 Chest pain, unspecified: Secondary | ICD-10-CM

## 2013-11-11 NOTE — Progress Notes (Signed)
Exercise Treadmill Test  Pre-Exercise Testing Evaluation Rhythm: normal sinus  Rate: 83 bpm     Test  Exercise Tolerance Test Ordering MD: Charlton Haws, MD  Interpreting MD: Tereso Newcomer, PA-C  Unique Test No: 1  Treadmill:  1  Indication for ETT: chest pain - rule out ischemia  Contraindication to ETT: No   Stress Modality: exercise - treadmill  Cardiac Imaging Performed: non   Protocol: standard Bruce - maximal  Max BP:  201/70  Max MPHR (bpm):  181 85% MPR (bpm):  154  MPHR obtained (bpm):  179 % MPHR obtained:  98  Reached 85% MPHR (min:sec):  5:46 Total Exercise Time (min-sec):  7:00  Workload in METS:  8.4 Borg Scale: 18  Reason ETT Terminated:  patient's desire to stop    ST Segment Analysis At Rest: normal ST segments - no evidence of significant ST depression With Exercise: non-specific ST changes  Other Information Arrhythmia:  No Angina during ETT:  absent (0) Quality of ETT:  diagnostic  ETT Interpretation:  normal - no evidence of ischemia by ST analysis  Comments: Good exercise capacity. No chest pain. Normal BP response to exercise. No ST changes to suggest ischemia.   Recommendations: F/u with Dr. Charlton Haws as directed. Signed,  Tereso Newcomer, PA-C   11/11/2013 9:47 AM

## 2013-11-28 ENCOUNTER — Ambulatory Visit (INDEPENDENT_AMBULATORY_CARE_PROVIDER_SITE_OTHER): Payer: Managed Care, Other (non HMO) | Admitting: Family

## 2013-11-28 ENCOUNTER — Encounter: Payer: Self-pay | Admitting: Family

## 2013-11-28 VITALS — BP 128/60 | HR 84 | Ht 70.0 in | Wt 240.0 lb

## 2013-11-28 DIAGNOSIS — G43909 Migraine, unspecified, not intractable, without status migrainosus: Secondary | ICD-10-CM

## 2013-11-28 DIAGNOSIS — I10 Essential (primary) hypertension: Secondary | ICD-10-CM

## 2013-11-28 MED ORDER — KETOROLAC TROMETHAMINE 15.75 MG/SPRAY NA SOLN: 2.0000 | Freq: Two times a day (BID) | NASAL | Status: DC

## 2013-11-28 MED ORDER — METOPROLOL SUCCINATE ER 25 MG PO TB24: 25.0000 mg | ORAL_TABLET | Freq: Every day | ORAL | Status: DC

## 2013-11-28 NOTE — Patient Instructions (Signed)

## 2013-11-28 NOTE — Progress Notes (Signed)
Subjective:    Patient ID: Erica Lang, female    DOB: 01-Sep-1976, 37 y.o.   MRN: [id number]  HPI 37 year old [place] American female, six-week postpartum, is in today to reestablish with a history of hypertension and migraine headaches. She's currently under the care of neurology for migraine headaches. Is taking Procardia for blood pressure control and is tolerating it well. Reports her headaches occur daily they do not respond to Tylenol. She's currently awaiting for neurology to contact the pediatrician to see if she's a candidate for Topamax. She is currently nursing.   Review of Systems  Constitutional: Negative.   Respiratory: Negative.   Cardiovascular: Negative.   Genitourinary: Negative.   Musculoskeletal: Negative.   Skin: Negative.   Allergic/Immunologic: Negative.   Neurological: Positive for headaches. Negative for dizziness.  Psychiatric/Behavioral: Negative.    Past Medical History  Diagnosis Date  . Anemia   . Anxiety   . Allergy   . Hypertension   . Abnormal Pap smear   . Hidradenitis   . Obesity   . PCOS (polycystic ovarian syndrome)   . Asthma   . 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 Hx of varicella   . Postpartum hypertension 10/19/2013  . Postpartum edema 10/19/2013    History   Social History  . Marital Status: Married    Spouse Name: N/A    Number of Children: N/A  . Years of Education: N/A   Occupational History  .      [employer]   Social History Main Topics  . Smoking status: Never Smoker   . Smokeless tobacco: Never Used  . Alcohol Use: No  . Drug Use: No  . Sexual Activity: Yes   Other Topics Concern  . Not on file   Social History Narrative  . No narrative on file    Past Surgical History  Procedure Laterality Date  . Vulva surgery    . Pelvic laparoscopy      Family History  Problem Relation Age of Onset  . Stroke Mother   . Hypertension Mother   . Heart disease Mother   . Deep vein  thrombosis Mother     both legs, arms and back, 2 blood clots migrted to her chest area  . Diabetes Mother     Allergies  Allergen Reactions  . Bee Venom Anaphylaxis    Current Outpatient Prescriptions on File Prior to Visit  Medication Sig Dispense Refill  . albuterol (PROVENTIL HFA;VENTOLIN HFA) 108 (90 BASE) MCG/ACT inhaler Inhale 2 puffs into the lungs every 6 (six) hours as needed for wheezing.  1 Inhaler  0  . docusate sodium (COLACE) 100 MG capsule Take 100 mg by mouth 2 (two) times daily.      Marland Kitchen EPINEPHrine (EPI-PEN) 0.3 mg/0.3 mL DEVI Inject 0.3 mLs (0.3 mg total) into the muscle once.  1 Device  0  . ibuprofen (ADVIL,MOTRIN) 600 MG tablet Take 1 tablet (600 mg total) by mouth every 6 (six) hours.  30 tablet  0  . polyethylene glycol powder (GLYCOLAX/MIRALAX) powder Take 17 g by mouth daily.      . potassium chloride SA (K-DUR,KLOR-CON) 20 MEQ tablet Take 1 tablet (20 mEq total) by mouth daily.  14 tablet  0  . Prenatal Vit-Fe Fumarate-FA (PRENATAL MULTIVITAMIN) TABS tablet Take 1 tablet by mouth daily at 12 noon.      . prochlorperazine (COMPAZINE) 10 MG tablet Take 1 tablet (10 mg total) by mouth every  8 (eight) hours as needed.  30 tablet  0   No current facility-administered medications on file prior to visit.    BP 128/60  Pulse 84  Ht 5\' 10"  (1.778 m)  Wt 240 lb (108.863 kg)  BMI 34.44 kg/m2chart    Objective:   Physical Exam  Constitutional: She is oriented to person, place, and time. She appears well-developed and well-nourished.  HENT:  Right Ear: External ear normal.  Left Ear: External ear normal.  Nose: Nose normal.  Mouth/Throat: Oropharynx is clear and moist.  Cardiovascular: Normal rate, regular rhythm and normal heart sounds.   Pulmonary/Chest: Effort normal and breath sounds normal.  130/76  Musculoskeletal: Normal range of motion.  Neurological: She is alert and oriented to person, place, and time. She has normal reflexes.  Skin: Skin is warm.    Psychiatric: She has a normal mood and affect.          Assessment & Plan:  Assessment: 1. Hypertension 2. Persistent headaches  Plan: Drink plenty of water. DC Procardia and start Toprol-XL to try to help with blood pressure control as well as headache reduction. Started 25 mg once daily. Bring patient back for recheck in 2 weeks.

## 2013-12-12 ENCOUNTER — Ambulatory Visit: Payer: Managed Care, Other (non HMO) | Admitting: Family

## 2013-12-12 DIAGNOSIS — Z0289 Encounter for other administrative examinations: Secondary | ICD-10-CM

## 2014-01-19 ENCOUNTER — Encounter: Payer: Self-pay | Admitting: *Deleted

## 2014-10-05 ENCOUNTER — Encounter (HOSPITAL_COMMUNITY): Payer: Self-pay | Admitting: Emergency Medicine

## 2014-10-05 ENCOUNTER — Emergency Department (HOSPITAL_COMMUNITY)
Admission: EM | Admit: 2014-10-05 | Discharge: 2014-10-05 | Disposition: A | Discharge status: Home / Self Care | Payer: No Typology Code available for payment source | Attending: Emergency Medicine | Admitting: Emergency Medicine

## 2014-10-05 ENCOUNTER — Emergency Department (HOSPITAL_COMMUNITY): Payer: No Typology Code available for payment source

## 2014-10-05 DIAGNOSIS — I10 Essential (primary) hypertension: Secondary | ICD-10-CM | POA: Diagnosis not present

## 2014-10-05 DIAGNOSIS — J45909 Unspecified asthma, uncomplicated: Secondary | ICD-10-CM | POA: Diagnosis not present

## 2014-10-05 DIAGNOSIS — Z8742 Personal history of other diseases of the female genital tract: Secondary | ICD-10-CM | POA: Diagnosis not present

## 2014-10-05 DIAGNOSIS — Z8619 Personal history of other infectious and parasitic diseases: Secondary | ICD-10-CM | POA: Diagnosis not present

## 2014-10-05 DIAGNOSIS — Z872 Personal history of diseases of the skin and subcutaneous tissue: Secondary | ICD-10-CM | POA: Diagnosis not present

## 2014-10-05 DIAGNOSIS — E669 Obesity, unspecified: Secondary | ICD-10-CM | POA: Insufficient documentation

## 2014-10-05 DIAGNOSIS — K8020 Calculus of gallbladder without cholecystitis without obstruction: Secondary | ICD-10-CM | POA: Diagnosis not present

## 2014-10-05 DIAGNOSIS — Z8659 Personal history of other mental and behavioral disorders: Secondary | ICD-10-CM | POA: Diagnosis not present

## 2014-10-05 DIAGNOSIS — Z791 Long term (current) use of non-steroidal anti-inflammatories (NSAID): Secondary | ICD-10-CM | POA: Insufficient documentation

## 2014-10-05 DIAGNOSIS — Z79899 Other long term (current) drug therapy: Secondary | ICD-10-CM | POA: Diagnosis not present

## 2014-10-05 DIAGNOSIS — R1011 Right upper quadrant pain: Secondary | ICD-10-CM | POA: Diagnosis present

## 2014-10-05 DIAGNOSIS — D649 Anemia, unspecified: Secondary | ICD-10-CM | POA: Insufficient documentation

## 2014-10-05 DIAGNOSIS — Z3202 Encounter for pregnancy test, result negative: Secondary | ICD-10-CM | POA: Diagnosis not present

## 2014-10-05 LAB — COMPREHENSIVE METABOLIC PANEL
ALBUMIN: 4 g/dL (ref 3.5–5.2)
ALK PHOS: 81 U/L (ref 39–117)
ALT: 161 U/L — ABNORMAL HIGH (ref 0–35)
AST: 175 U/L — AB (ref 0–37)
Anion gap: 14 (ref 5–15)
BILIRUBIN TOTAL: 0.5 mg/dL (ref 0.3–1.2)
BUN: 13 mg/dL (ref 6–23)
CO2: 23 mEq/L (ref 19–32)
Calcium: 9.8 mg/dL (ref 8.4–10.5)
Chloride: 100 mEq/L (ref 96–112)
Creatinine, Ser: 0.58 mg/dL (ref 0.50–1.10)
GFR calc Af Amer: >90 mL/min (ref >90)
GFR calc non Af Amer: >90 mL/min (ref >90)
Glucose, Bld: 117 mg/dL — ABNORMAL HIGH (ref 70–99)
POTASSIUM: 4.1 meq/L (ref 3.7–5.3)
Sodium: 137 mEq/L (ref 137–147)
Total Protein: 7.9 g/dL (ref 6.0–8.3)

## 2014-10-05 LAB — CBC WITH DIFFERENTIAL/PLATELET
BASOS ABS: 0 10*3/uL (ref 0.0–0.1)
BASOS PCT: 0 % (ref 0–1)
Eosinophils Absolute: 0.1 10*3/uL (ref 0.0–0.7)
Eosinophils Relative: 1 % (ref 0–5)
HCT: 35.4 % — ABNORMAL LOW (ref 36.0–46.0)
HEMOGLOBIN: 12.2 g/dL (ref 12.0–15.0)
Lymphocytes Relative: 17 % (ref 12–46)
Lymphs Abs: 1.7 10*3/uL (ref 0.7–4.0)
MCH: 30.7 pg (ref 26.0–34.0)
MCHC: 34.5 g/dL (ref 30.0–36.0)
MCV: 88.9 fL (ref 78.0–100.0)
Monocytes Absolute: 0.8 10*3/uL (ref 0.1–1.0)
Monocytes Relative: 8 % (ref 3–12)
NEUTROS PCT: 74 % (ref 43–77)
Neutro Abs: 7.4 10*3/uL (ref 1.7–7.7)
Platelets: 286 10*3/uL (ref 150–400)
RBC: 3.98 MIL/uL (ref 3.87–5.11)
RDW: 13.4 % (ref 11.5–15.5)
WBC: 9.9 10*3/uL (ref 4.0–10.5)

## 2014-10-05 LAB — URINE MICROSCOPIC-ADD ON

## 2014-10-05 LAB — URINALYSIS, ROUTINE W REFLEX MICROSCOPIC
Bilirubin Urine: NEGATIVE
GLUCOSE, UA: NEGATIVE mg/dL
HGB URINE DIPSTICK: NEGATIVE
Ketones, ur: NEGATIVE mg/dL
Nitrite: NEGATIVE
Protein, ur: NEGATIVE mg/dL
Specific Gravity, Urine: 1.018 (ref 1.005–1.030)
Urobilinogen, UA: 0.2 mg/dL (ref 0.0–1.0)
pH: 7.5 (ref 5.0–8.0)

## 2014-10-05 LAB — POC URINE PREG, ED: Preg Test, Ur: NEGATIVE

## 2014-10-05 LAB — LIPASE, BLOOD: Lipase: 19 U/L (ref 11–59)

## 2014-10-05 MED ORDER — ONDANSETRON 4 MG PO TBDP: ORAL_TABLET | ORAL | Status: DC

## 2014-10-05 MED ORDER — ONDANSETRON HCL 4 MG/2ML IJ SOLN
4.0000 mg | Freq: Once | INTRAMUSCULAR | Status: AC
  Administered 2014-10-05: 4 mg via INTRAVENOUS
  Filled 2014-10-05: qty 2

## 2014-10-05 MED ORDER — FENTANYL CITRATE 0.05 MG/ML IJ SOLN
50.0000 ug | Freq: Once | INTRAMUSCULAR | Status: AC
  Administered 2014-10-05: 50 ug via INTRAVENOUS
  Filled 2014-10-05: qty 2

## 2014-10-05 MED ORDER — HYDROMORPHONE HCL 1 MG/ML IJ SOLN
1.0000 mg | Freq: Once | INTRAMUSCULAR | Status: AC
  Administered 2014-10-05: 1 mg via INTRAVENOUS
  Filled 2014-10-05: qty 1

## 2014-10-05 MED ORDER — OXYCODONE HCL 5 MG PO TABS: 5.0000 mg | ORAL_TABLET | Freq: Four times a day (QID) | ORAL | Status: DC | PRN

## 2014-10-05 MED ORDER — SODIUM CHLORIDE 0.9 % IV BOLUS (SEPSIS)
1000.0000 mL | Freq: Once | INTRAVENOUS | Status: AC
  Administered 2014-10-05: 1000 mL via INTRAVENOUS

## 2014-10-05 MED ORDER — GI COCKTAIL ~~LOC~~
30.0000 mL | Freq: Once | ORAL | Status: AC
  Administered 2014-10-05: 30 mL via ORAL
  Filled 2014-10-05: qty 30

## 2014-10-05 MED ORDER — OXYCODONE-ACETAMINOPHEN 5-325 MG PO TABS
2.0000 | ORAL_TABLET | Freq: Once | ORAL | Status: AC
  Administered 2014-10-05: 2 via ORAL
  Filled 2014-10-05: qty 2

## 2014-10-05 NOTE — ED Provider Notes (Signed)
CSN: [id number]     Arrival date & time 10/05/14  [id number] History   First MD Initiated Contact with Patient 10/05/14 [phone]     Chief Complaint  Patient presents with  . Abdominal Pain     (Consider location/radiation/quality/duration/timing/severity/associated sxs/prior Treatment) Patient is a 38 y.o. female presenting with abdominal pain.  Abdominal Pain Pain location:  Epigastric and RUQ Pain quality: sharp   Pain radiates to:  R flank Pain severity:  Severe Onset quality:  Gradual Timing:  Constant Progression:  Worsening Chronicity:  Recurrent Context: not recent illness, not recent sexual activity, not suspicious food intake and not trauma   Relieved by:  Nothing Worsened by:  Nothing tried Associated symptoms: nausea and vomiting   Associated symptoms: no chest pain, no cough, no diarrhea, no fever and no shortness of breath     Past Medical History  Diagnosis Date  . Anemia   . Anxiety   . Allergy   . Hypertension   . Abnormal Pap smear   . Hidradenitis   . Obesity   . PCOS (polycystic ovarian syndrome)   . Asthma   . 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 Hx of varicella   . Postpartum hypertension 10/19/2013  . Postpartum edema 10/19/2013   Past Surgical History  Procedure Laterality Date  . Vulva surgery    . Pelvic laparoscopy     Family History  Problem Relation Age of Onset  . Stroke Mother   . Hypertension Mother   . Heart disease Mother   . Deep vein thrombosis Mother     both legs, arms and back, 2 blood clots migrted to her chest area  . Diabetes Mother    History  Substance Use Topics  . Smoking status: Never Smoker   . Smokeless tobacco: Never Used  . Alcohol Use: No   OB History   Grav Para Term Preterm Abortions TAB SAB Ect Mult Living   3 2 2  1  1   2      Review of Systems  Constitutional: Negative for fever.  Respiratory: Negative for cough and shortness of breath.   Cardiovascular: Negative for chest pain and leg  swelling.  Gastrointestinal: Positive for nausea, vomiting and abdominal pain. Negative for diarrhea.  All other systems reviewed and are negative.     Allergies  Bee venom  Home Medications   Prior to Admission medications   Medication Sig Start Date End Date Taking? Authorizing Provider  acetaminophen (TYLENOL) 500 MG tablet Take 1,000 mg by mouth every 6 (six) hours as needed for headache.   Yes Historical Provider, MD  naproxen sodium (ANAPROX) 550 MG tablet Take 550 mg by mouth 2 (two) times daily as needed for mild pain.   Yes Historical Provider, MD  potassium chloride SA (K-DUR,KLOR-CON) 20 MEQ tablet Take 1 tablet (20 mEq total) by mouth daily. 10/30/13  Yes Robley FriesVaishali R Mody, MD  Prenatal Vit-Fe Fumarate-FA (PRENATAL MULTIVITAMIN) TABS tablet Take 1 tablet by mouth daily at 12 noon.   Yes Historical Provider, MD  albuterol (PROVENTIL HFA;VENTOLIN HFA) 108 (90 BASE) MCG/ACT inhaler Inhale 2 puffs into the lungs every 6 (six) hours as needed for wheezing. 12/08/12   Thao P Le, DO  EPINEPHrine (EPI-PEN) 0.3 mg/0.3 mL DEVI Inject 0.3 mLs (0.3 mg total) into the muscle once. 04/28/12   Rickard PatienceSarah E Nelson, PA-C   BP 127/81  Pulse 102  Temp(Src) 99.7 F (37.6 C)  Resp 18  Ht 5\' 9"  (1.753  m)  Wt 230 lb (104.327 kg)  BMI 33.95 kg/m2  SpO2 100%  LMP 09/14/2014 Physical Exam  Nursing note and vitals reviewed. Constitutional: She is oriented to person, place, and time. She appears well-developed and well-nourished. No distress.  HENT:  Head: Normocephalic and atraumatic.  Mouth/Throat: Oropharynx is clear and moist.  Eyes: EOM are normal. Pupils are equal, round, and reactive to light.  Neck: Normal range of motion. Neck supple.  Cardiovascular: Normal rate and regular rhythm.  Exam reveals no friction rub.   No murmur heard. Pulmonary/Chest: Effort normal and breath sounds normal. No respiratory distress. She has no wheezes. She has no rales.  Abdominal: Soft. She exhibits no  distension. There is tenderness (RUQ, epigastric). There is guarding (RUQ). There is no rebound.  Musculoskeletal: Normal range of motion. She exhibits no edema.  Neurological: She is alert and oriented to person, place, and time.  Skin: She is not diaphoretic.    ED Course  Procedures (including critical care time) Labs Review Labs Reviewed  CBC WITH DIFFERENTIAL - Abnormal; Notable for the following:    HCT 35.4 (*)    All other components within normal limits  COMPREHENSIVE METABOLIC PANEL  LIPASE, BLOOD  URINALYSIS, ROUTINE W REFLEX MICROSCOPIC  POC URINE PREG, ED    Imaging Review US Abdomen Limited Ruq  10/05/2014   CLINICAL DATA:  Right upper quadrant pain with nausea and vomiting  EXAM: US ABDOMEN LIMITED - RIGHT UPPER QUADRANT  COMPARISON:  None.  FINDINGS: Gallbladder:  There is a 1.1 cm in length echogenic focus which shadows in the fundus of the gallbladder consistent with cholelithiasis. There is a fold within the gallbladder, an anatomic variant. There is no gallbladder wall thickening or pericholecystic fluid. No sonographic Murphy sign noted.  Common bile duct:  Diameter: 6.5 mm proximally tapering to 4 mm distally. No mass or calculus is seen in the biliary ductal system.  Liver:  No focal lesion identified. Liver echogenicity is mildly increased overall.  IMPRESSION: Cholelithiasis. Liver echogenicity increased. This finding most likely is due to hepatic steatosis. While no focal liver lesions are identified, it must be cautioned that the sensitivity of ultrasound for focal liver lesions is diminished in this circumstance.   Electronically Signed   By: Bretta Bang M.D.   On: 10/05/2014 08:41     EKG Interpretation None      MDM   Final diagnoses:  RUQ pain  Calculus of gallbladder without cholecystitis without obstruction    24F here with RUQ pain. Happened before, 5 times, never lasted this long. Usually lasted about 2-3 hours with some vomiting. Now has  been about 8 hours. Persistent vomiting. No new foods. Here with RUQ and epigastric pain. +Murphy's sign. Will [name] for concerns for cholecystitis. US shows cholelithiasis, no cholecystitis. Mild transaminitis, no signs of biliary obstruction. Patient stable for discharge, given instructions to f/u with [facility].   Elwin Mocha, MD 10/05/14 1009

## 2014-10-05 NOTE — ED Notes (Signed)
Pt c/o upper mid abd pain radiating to RUQ onset 2300 last evening with n/v.

## 2014-10-05 NOTE — Discharge Instructions (Signed)
Cholelithiasis °Cholelithiasis (also called gallstones) is a form of gallbladder disease in which gallstones form in your gallbladder. The gallbladder is an organ that stores bile made in the liver, which helps digest fats. Gallstones begin as small crystals and slowly grow into stones. Gallstone pain occurs when the gallbladder spasms and a gallstone is blocking the duct. Pain can also occur when a stone passes out of the duct.  °RISK FACTORS °· Being female.   °· Having multiple pregnancies. Health care providers sometimes advise removing diseased gallbladders before future pregnancies.   °· Being obese. °· Eating a diet heavy in fried foods and fat.   °· Being older than 60 years and increasing age.   °· Prolonged use of medicines containing female hormones.   °· Having diabetes mellitus.   °· Rapidly losing weight.   °· Having a family history of gallstones (heredity).   °SYMPTOMS °· Nausea.   °· Vomiting. °· Abdominal pain.   °· Yellowing of the skin (jaundice).   °· Sudden pain. It may persist from several minutes to several hours. °· Fever.   °· Tenderness to the touch.  °In some cases, when gallstones do not move into the bile duct, people have no pain or symptoms. These are called "silent" gallstones.  °TREATMENT °Silent gallstones do not need treatment. In severe cases, emergency surgery may be required. Options for treatment include: °· Surgery to remove the gallbladder. This is the most common treatment. °· Medicines. These do not always work and may take 6-12 months or more to work. °· Shock wave treatment (extracorporeal biliary lithotripsy). In this treatment an ultrasound machine sends shock waves to the gallbladder to break gallstones into smaller pieces that can pass into the intestines or be dissolved by medicine. °HOME CARE INSTRUCTIONS  °· Only take over-the-counter or prescription medicines for pain, discomfort, or fever as directed by your health care provider.   °· Follow a low-fat diet until  seen again by your health care provider. Fat causes the gallbladder to contract, which can result in pain.   °· Follow up with your health care provider as directed. Attacks are almost always recurrent and surgery is usually required for permanent treatment.   °SEEK IMMEDIATE MEDICAL CARE IF:  °· Your pain increases and is not controlled by medicines.   °· You have a fever or persistent symptoms for more than 2-3 days.   °· You have a fever and your symptoms suddenly get worse.   °· You have persistent nausea and vomiting.   °MAKE SURE YOU:  °· Understand these instructions. °· Will watch your condition. °· Will get help right away if you are not doing well or get worse. °Document Released: 12/11/2005 Document Revised: 08/17/2013 Document Reviewed: 06/08/2013 °ExitCare® Patient Information ©2015 [employer]. This information is not intended to replace advice given to you by your health care provider. Make sure you discuss any questions you have with your health care provider. ° °

## 2014-10-23 ENCOUNTER — Encounter (HOSPITAL_COMMUNITY): Payer: Self-pay | Admitting: Pharmacy Technician

## 2014-10-23 NOTE — Progress Notes (Signed)
Please put orders in Epic surgery 10-27-14 pre op 10-25-14 Thanks

## 2014-10-24 ENCOUNTER — Other Ambulatory Visit (INDEPENDENT_AMBULATORY_CARE_PROVIDER_SITE_OTHER): Payer: Self-pay | Admitting: General Surgery

## 2014-10-24 NOTE — H&P (Signed)
Erica Lang 10/19/2014 3:00 PM Location: [facility] Patient #: [id number] DOB: 30-Jul-1976 Married / Language: English / Race: Black or African American Female  History of Present Illness Erica Lang M. Nylen Creque MD; 10/20/2014 10:13 PM) Patient words: f/u post op gallbladder.  The patient is a 38 year old female who presents for evaluation of gall stones. 38 year old [place] American female referred by Dr. [name] for evaluation of gallbladder disease. The patient states that for the past one month she has had intermittent episodes of abdominal pain. Initially they were infrequent and would last for several hours they're becoming more frequent and more intense. She describes the pain as a sharp pain it'll sometimes radiate to her back. It is mainly on the right side. Often times it is associated with nausea and vomiting. The last episode which was the most severe prompted her to go to the emergency room in early October. She ended up going to the emergency room because the discomfort was not relenting. There she was found to have gallstones on abdominal ultrasound. She also had some mild AST and ALT elevation as well. There is no signs of cholecystitis on ultrasound. She was given pain medication and nausea medicine and sent home to followup with [name]. She denies any recent fevers or chills. She still reports some intermittent episodes of abdominal pain with nausea. However she denies any vomiting. She reports that she is out of pain medication. She denies any jaundice. She denies any fever or chills. She denies any melena or hematochezia. She denies any NSAID use.   Other Problems [name], LPN; 40/98/1191 3:23 PM) Asthma  Past Surgical History [name], LPN; 47/82/9562 3:23 PM) Oral Surgery  Diagnostic Studies History [name], LPN; 13/07/6577 3:23 PM) Colonoscopy never Mammogram never Pap Smear 1-5 years ago  Allergies [name], LPN; 46/96/2952 3:25  PM) BEE VENOM  Medication History [name], LPN; 84/13/2440 3:26 PM) Zofran ODT (4MG  Tablet Disperse, Oral) Active. Albuterol Sulfate (108 (90 Base)MCG/ACT Aero Pow Br Act, Inhalation) Active. Medications Reconciled  Social History [name], LPN; 10/25/2535 3:23 PM) Caffeine use Tea. No alcohol use No drug use Tobacco use Never smoker.  Family History [name], LPN; 64/40/3474 3:23 PM) Cerebrovascular Accident Mother. Heart disease in female family member before age 69 Hypertension Mother. Migraine Headache Mother.  Pregnancy / Birth History [name], LPN; 25/95/6387 3:23 PM) Age at menarche 14 years. Contraceptive History Depo-provera, Intrauterine device. Gravida 3 Maternal age 38-35 Para 2 Regular periods  Review of Systems [name] LPN; 56/43/3295 3:23 PM) General Not Present- Appetite Loss, Chills, Fatigue, Fever, Night Sweats, Weight Gain and Weight Loss. Skin Not Present- Change in Wart/Mole, Dryness, Hives, Jaundice, New Lesions, Non-Healing Wounds, Rash and Ulcer. HEENT Not Present- Earache, Hearing Loss, Hoarseness, Nose Bleed, Oral Ulcers, Ringing in the Ears, Seasonal Allergies, Sinus Pain, Sore Throat, Visual Disturbances, Wears glasses/contact lenses and Yellow Eyes. Respiratory Present- Snoring. Not Present- Bloody sputum, Chronic Cough, Difficulty Breathing and Wheezing. Breast Not Present- Breast Mass, Breast Pain, Nipple Discharge and Skin Changes. Gastrointestinal Present- Bloating, Indigestion, Nausea and Vomiting. Not Present- Abdominal Pain, Bloody Stool, Change in Bowel Habits, Chronic diarrhea, Constipation, Difficulty Swallowing, Excessive gas, Gets full quickly at meals, Hemorrhoids and Rectal Pain. Female Genitourinary Not Present- Frequency, Nocturia, Painful Urination, Pelvic Pain and Urgency. Musculoskeletal Not Present- Back Pain, Joint Pain, Joint Stiffness, Muscle Pain, Muscle Weakness and  Swelling of Extremities. Psychiatric Not Present- Anxiety, Bipolar, Change in Sleep Pattern, Depression, Fearful and Frequent crying. Endocrine  Not Present- Cold Intolerance, Excessive Hunger, Hair Changes, Heat Intolerance, Hot flashes and New Diabetes. Hematology Not Present- Easy Bruising, Excessive bleeding, Gland problems, HIV and Persistent Infections.   Vitals [name]([name] LPN; 09/81/191410/22/2015 3:26 PM) 10/19/2014 3:26 PM Weight: 239 lb Height: 69.5in Body Surface Area: 2.31 m Body Mass Index: 34.79 kg/m Pain Level: 8/10 Temp.: 97.68F(Temporal)  Pulse: 74 (Regular)  Resp.: 18 (Unlabored)  BP: 128/88 (Sitting, Left Arm, Standard)    Physical Exam Erica Lang(Jersey Espinoza M. Mehki Klumpp MD; 10/20/2014 10:09 PM) General Mental Status-Alert. General Appearance-Consistent with stated age. Hydration-Well hydrated. Voice-Normal.  Head and Neck Head-normocephalic, atraumatic with no lesions or palpable masses. Trachea-midline. Thyroid Gland Characteristics - normal size and consistency.  Eye Eyeball - Bilateral-Extraocular movements intact. Sclera/Conjunctiva - Bilateral-No scleral icterus.  Chest and Lung Exam Chest and lung exam reveals -quiet, even and easy respiratory effort with no use of accessory muscles and on auscultation, normal breath sounds, no adventitious sounds and normal vocal resonance. Inspection Chest Wall - Normal. Back - normal.  Breast - Did not examine.  Cardiovascular Cardiovascular examination reveals -normal heart sounds, regular rate and rhythm with no murmurs and normal pedal pulses bilaterally.  Abdomen Inspection Inspection of the abdomen reveals - No Hernias. Skin - Scar - no surgical scars. Palpation/Percussion Palpation and Percussion of the abdomen reveal - Soft, No Rebound tenderness, No Rigidity (guarding) and No hepatosplenomegaly. Note: MILD TTP IN RUQ. Auscultation Auscultation of the abdomen reveals - Bowel sounds  normal.  Peripheral Vascular Upper Extremity Palpation - Pulses bilaterally normal.  Neurologic Neurologic evaluation reveals -alert and oriented x 3 with no impairment of recent or remote memory. Mental Status-Normal.  Neuropsychiatric The patient's mood and affect are described as -normal. Judgment and Insight-insight is appropriate concerning matters relevant to self.  Musculoskeletal Normal Exam - Left-Upper Extremity Strength Normal and Lower Extremity Strength Normal. Normal Exam - Right-Upper Extremity Strength Normal and Lower Extremity Strength Normal.  Lymphatic Head & Neck  General Head & Neck Lymphatics: Bilateral - Description - Normal. Axillary - Did not examine. Femoral & Inguinal - Did not examine.    Assessment & Plan Erica Lang(Meosha Castanon M. Alandra Sando MD; 10/20/2014 10:10 PM) SYMPTOMATIC CHOLELITHIASIS (574.20  K80.20) Impression: I believe her symptoms are consistent with gallstone disease. She would like to proceed with surgery. I'm going to try to get her on the schedule as soon as possible some concern that she may end up back in the emergency room for ongoing symptoms. Currently there is no signs of acute cholecystitis.  I believe the patient's symptoms are consistent with gallbladder disease.  We discussed gallbladder disease. The patient was given Agricultural engineereducational material. We discussed non-operative and operative management. We discussed the signs & symptoms of acute cholecystitis  I discussed laparoscopic cholecystectomy with IOC in detail. The patient was given educational material as well as diagrams detailing the procedure. We discussed the risks and benefits of a laparoscopic cholecystectomy including, but not limited to bleeding, infection, injury to surrounding structures such as the intestine or liver, bile leak, retained gallstones, need to convert to an open procedure, prolonged diarrhea, blood clots such as DVT, common bile duct injury, anesthesia risks,  and possible need for additional procedures. We discussed the typical post-operative recovery course. I explained that the likelihood of improvement of their symptoms is good.  The patient has elected to proceed with surgery. Current Plans  Pt Education - Cholecystectomy: Gallbladder Removal with Open Surgery: laparoscopic cholecystectomy Schedule for Surgery Started OxyCODONE HCl 5MG , 1 (one) Tablet four  times daily, as needed, #30, 10/19/2014, No Refill. Started Zofran ODT 4MG , 1 (one) Tablet Disperse every four hours, as needed, #20, 10/19/2014, No Refill.  Mary SellaEric M. Andrey CampanileWilson, MD, FACS General, Bariatric, & Minimally Invasive Surgery [facility] [place]

## 2014-10-25 ENCOUNTER — Encounter (HOSPITAL_COMMUNITY)
Admission: RE | Admit: 2014-10-25 | Discharge: 2014-10-25 | Disposition: A | Discharge status: Home / Self Care | Payer: No Typology Code available for payment source | Source: Ambulatory Visit | Attending: General Surgery | Admitting: General Surgery

## 2014-10-25 ENCOUNTER — Encounter (HOSPITAL_COMMUNITY): Payer: Self-pay

## 2014-10-25 DIAGNOSIS — K8020 Calculus of gallbladder without cholecystitis without obstruction: Secondary | ICD-10-CM | POA: Diagnosis present

## 2014-10-25 DIAGNOSIS — K8010 Calculus of gallbladder with chronic cholecystitis without obstruction: Secondary | ICD-10-CM | POA: Diagnosis not present

## 2014-10-25 DIAGNOSIS — Z79899 Other long term (current) drug therapy: Secondary | ICD-10-CM | POA: Diagnosis not present

## 2014-10-25 DIAGNOSIS — Z91030 Bee allergy status: Secondary | ICD-10-CM | POA: Diagnosis not present

## 2014-10-25 LAB — COMPREHENSIVE METABOLIC PANEL
ALT: 21 U/L (ref 0–35)
ANION GAP: 12 (ref 5–15)
AST: 15 U/L (ref 0–37)
Albumin: 3.5 g/dL (ref 3.5–5.2)
Alkaline Phosphatase: 63 U/L (ref 39–117)
BUN: 7 mg/dL (ref 6–23)
CALCIUM: 9.3 mg/dL (ref 8.4–10.5)
CO2: 26 mEq/L (ref 19–32)
Chloride: 103 mEq/L (ref 96–112)
Creatinine, Ser: 0.54 mg/dL (ref 0.50–1.10)
GFR calc Af Amer: >90 mL/min (ref >90)
GFR calc non Af Amer: >90 mL/min (ref >90)
Glucose, Bld: 88 mg/dL (ref 70–99)
Potassium: 3.9 mEq/L (ref 3.7–5.3)
Sodium: 141 mEq/L (ref 137–147)
Total Bilirubin: <0.2 mg/dL — ABNORMAL LOW (ref 0.3–1.2)
Total Protein: 7.4 g/dL (ref 6.0–8.3)

## 2014-10-25 LAB — HCG, SERUM, QUALITATIVE: PREG SERUM: NEGATIVE

## 2014-10-25 NOTE — Patient Instructions (Addendum)
Erica Collardalisha D Lang  10/25/2014                           YOUR PROCEDURE IS SCHEDULED ON:  10/27/14                ENTER FROM [address] - GO TO [address]               LOOK FOR [employer] [facility]  / [employer]                              FOLLOW  SIGNS TO SHORT STAY CENTER                 ARRIVE AT SHORT STAY AT:  11:45 AM               CALL THIS NUMBER IF ANY PROBLEMS THE DAY OF SURGERY :               [phone]                                REMEMBER:   Do not eat food  AFTER MIDNIGHT             MAY HAVE CLEAR LIQUIDS UNTIL 7:45 AM     CLEAR LIQUID DIET   Foods Allowed                                                                     Foods Excluded  Coffee and tea, regular and decaf                             liquids that you cannot  Plain Jell-O in any flavor                                             see through such as: Fruit ices (not with fruit pulp)                                     milk, soups, orange juice  Iced Popsicles                                    All solid food Carbonated beverages, regular and diet                                    Cranberry, grape and apple juices Sports drinks like Gatorade Lightly seasoned clear broth or consume(fat free) Sugar, honey syrup    _____________________________________________________________________                    Take these medicines the morning of surgery with  A SIPS OF WATER :    May take oxycodone if needed / may use inhaler if needed     Do not wear jewelry, make-up   Do not wear lotions, powders, or perfumes.   Do not shave legs or underarms 12 hrs. before surgery (men may shave face)  Do not bring valuables to the hospital.  Contacts, dentures or bridgework may not be worn into surgery.  Leave suitcase in the car. After surgery it may be brought to your room.  For patients admitted to the hospital more than one night, checkout time is            11:00 AM                                                        The day of discharge.   Patients discharged the day of surgery will not be allowed to drive home.            If going home same day of surgery, must have someone stay with you              FIRST 24 hrs at home and arrange for some one to drive you              home from hospital.   ________________________________________________________________________                                                                                                  [facility] - PREPARING FOR SURGERY  Before surgery, you can play an important role.  Because skin is not sterile, your skin needs to be as free of germs as possible.  You can reduce the number of germs on your skin by washing with CHG (chlorahexidine gluconate) soap before surgery.  CHG is an antiseptic cleaner which kills germs and bonds with the skin to continue killing germs even after washing. Please DO NOT use if you have an allergy to CHG or antibacterial soaps.  If your skin becomes reddened/irritated stop using the CHG and inform your nurse when you arrive at Short Stay. Do not shave (including legs and underarms) for at least 48 hours prior to the first CHG shower.  You may shave your face. Please follow these instructions carefully:   1.  Shower with CHG Soap the night before surgery and the  morning of Surgery.   2.  If you choose to wash your hair, wash your hair first as usual with your  normal  Shampoo.   3.  After you shampoo, rinse your hair and body thoroughly to remove the  shampoo.                                         4.  Use CHG as you would any other liquid soap.  You can apply chg directly  to the skin and wash . Gently wash with scrungie or clean wascloth    5.  Apply the CHG Soap to your body ONLY FROM THE NECK DOWN.   Do not use on open                           Wound or open sores. Avoid contact with eyes, ears mouth and genitals (private parts).                         Genitals (private parts) with your normal soap.              6.  Wash thoroughly, paying special attention to the area where your surgery  will be performed.   7.  Thoroughly rinse your body with warm water from the neck down.   8.  DO NOT shower/wash with your normal soap after using and rinsing off  the CHG Soap .                9.  Pat yourself dry with a clean towel.             10.  Wear clean pajamas.             11.  Place clean sheets on your bed the night of your first shower and do not  sleep with pets.  Day of Surgery : Do not apply any lotions/deodorants the morning of surgery.  Please wear clean clothes to the hospital/surgery center.  FAILURE TO FOLLOW THESE INSTRUCTIONS MAY RESULT IN THE CANCELLATION OF YOUR SURGERY    PATIENT SIGNATURE_________________________________  ______________________________________________________________________

## 2014-10-26 NOTE — Anesthesia Preprocedure Evaluation (Signed)
Anesthesia Evaluation  Patient identified by MRN, date of birth, ID band Patient awake    Reviewed: Allergy & Precautions, H&P , NPO status , Patient's Chart, lab work & pertinent test results  History of Anesthesia Complications Negative for: history of anesthetic complications  Airway Mallampati: II  TM Distance: >3 FB Neck ROM: Full    Dental no notable dental hx.    Pulmonary asthma ,  breath sounds clear to auscultation  Pulmonary exam normal       Cardiovascular Exercise Tolerance: Good hypertension, Pt. on medications Rhythm:Regular Rate:Normal     Neuro/Psych  Headaches, PSYCHIATRIC DISORDERS Anxiety    GI/Hepatic negative GI ROS, Neg liver ROS,   Endo/Other  negative endocrine ROS  Renal/GU negative Renal ROS  negative genitourinary   Musculoskeletal negative musculoskeletal ROS (+)   Abdominal   Peds negative pediatric ROS (+)  Hematology negative hematology ROS (+)   Anesthesia Other Findings   Reproductive/Obstetrics negative OB ROS                             Anesthesia Physical Anesthesia Plan  ASA: II  Anesthesia Plan: General   Post-op Pain Management:    Induction: Intravenous  Airway Management Planned: Oral ETT  Additional Equipment:   Intra-op Plan:   Post-operative Plan: Extubation in OR  Informed Consent: I have reviewed the patients History and Physical, chart, labs and discussed the procedure including the risks, benefits and alternatives for the proposed anesthesia with the patient or authorized representative who has indicated his/her understanding and acceptance.   Dental advisory given  Plan Discussed with: CRNA  Anesthesia Plan Comments:         Anesthesia Quick Evaluation

## 2014-10-27 ENCOUNTER — Encounter (HOSPITAL_COMMUNITY)
Admission: RE | Disposition: A | Discharge status: Home / Self Care | Payer: Self-pay | Source: Ambulatory Visit | Attending: General Surgery

## 2014-10-27 ENCOUNTER — Ambulatory Visit (HOSPITAL_COMMUNITY): Payer: No Typology Code available for payment source | Admitting: Anesthesiology

## 2014-10-27 ENCOUNTER — Encounter (HOSPITAL_COMMUNITY): Payer: No Typology Code available for payment source | Admitting: Anesthesiology

## 2014-10-27 ENCOUNTER — Ambulatory Visit (HOSPITAL_COMMUNITY): Payer: No Typology Code available for payment source

## 2014-10-27 ENCOUNTER — Encounter (HOSPITAL_COMMUNITY): Payer: Self-pay | Admitting: *Deleted

## 2014-10-27 ENCOUNTER — Ambulatory Visit (HOSPITAL_COMMUNITY)
Admission: RE | Admit: 2014-10-27 | Discharge: 2014-10-27 | Disposition: A | Discharge status: Home / Self Care | Payer: No Typology Code available for payment source | Source: Ambulatory Visit | Attending: General Surgery | Admitting: General Surgery

## 2014-10-27 DIAGNOSIS — K8010 Calculus of gallbladder with chronic cholecystitis without obstruction: Secondary | ICD-10-CM | POA: Insufficient documentation

## 2014-10-27 DIAGNOSIS — Z79899 Other long term (current) drug therapy: Secondary | ICD-10-CM | POA: Insufficient documentation

## 2014-10-27 DIAGNOSIS — Z91030 Bee allergy status: Secondary | ICD-10-CM | POA: Insufficient documentation

## 2014-10-27 DIAGNOSIS — K8020 Calculus of gallbladder without cholecystitis without obstruction: Secondary | ICD-10-CM

## 2014-10-27 HISTORY — PX: CHOLECYSTECTOMY: SHX55

## 2014-10-27 SURGERY — LAPAROSCOPIC CHOLECYSTECTOMY WITH INTRAOPERATIVE CHOLANGIOGRAM
Anesthesia: General | Site: Abdomen

## 2014-10-27 MED ORDER — MIDAZOLAM HCL 5 MG/5ML IJ SOLN
INTRAMUSCULAR | Status: DC | PRN
  Administered 2014-10-27: 2 mg via INTRAVENOUS

## 2014-10-27 MED ORDER — FENTANYL CITRATE 0.05 MG/ML IJ SOLN
INTRAMUSCULAR | Status: DC | PRN
  Administered 2014-10-27 (×2): 50 ug via INTRAVENOUS
  Administered 2014-10-27: 100 ug via INTRAVENOUS
  Administered 2014-10-27 (×3): 50 ug via INTRAVENOUS

## 2014-10-27 MED ORDER — ONDANSETRON HCL 4 MG/2ML IJ SOLN
INTRAMUSCULAR | Status: AC
  Filled 2014-10-27: qty 2

## 2014-10-27 MED ORDER — SODIUM CHLORIDE 0.9 % IV SOLN: 250.0000 mL | INTRAVENOUS | Status: DC | PRN

## 2014-10-27 MED ORDER — ACETAMINOPHEN 325 MG PO TABS: 650.0000 mg | ORAL_TABLET | ORAL | Status: DC | PRN

## 2014-10-27 MED ORDER — MIDAZOLAM HCL 2 MG/2ML IJ SOLN
INTRAMUSCULAR | Status: AC
  Filled 2014-10-27: qty 2

## 2014-10-27 MED ORDER — LACTATED RINGERS IV SOLN
INTRAVENOUS | Status: DC | PRN
  Administered 2014-10-27: 1000 mL

## 2014-10-27 MED ORDER — ROCURONIUM BROMIDE 100 MG/10ML IV SOLN
INTRAVENOUS | Status: AC
  Filled 2014-10-27: qty 1

## 2014-10-27 MED ORDER — GLYCOPYRROLATE 0.2 MG/ML IJ SOLN
INTRAMUSCULAR | Status: AC
  Filled 2014-10-27: qty 2

## 2014-10-27 MED ORDER — FENTANYL CITRATE 0.05 MG/ML IJ SOLN
INTRAMUSCULAR | Status: AC
  Filled 2014-10-27: qty 2

## 2014-10-27 MED ORDER — DEXTROSE 5 % IV SOLN
2.0000 g | INTRAVENOUS | Status: AC
  Administered 2014-10-27: 2 g via INTRAVENOUS

## 2014-10-27 MED ORDER — PROMETHAZINE HCL 25 MG/ML IJ SOLN
6.2500 mg | Freq: Four times a day (QID) | INTRAMUSCULAR | Status: DC | PRN
  Administered 2014-10-27: 6.25 mg via INTRAVENOUS

## 2014-10-27 MED ORDER — PROPOFOL 10 MG/ML IV BOLUS
INTRAVENOUS | Status: DC | PRN
  Administered 2014-10-27: 150 mg via INTRAVENOUS

## 2014-10-27 MED ORDER — DEXAMETHASONE SODIUM PHOSPHATE 10 MG/ML IJ SOLN
INTRAMUSCULAR | Status: DC | PRN
  Administered 2014-10-27: 10 mg via INTRAVENOUS

## 2014-10-27 MED ORDER — DEXAMETHASONE SODIUM PHOSPHATE 10 MG/ML IJ SOLN
INTRAMUSCULAR | Status: AC
  Filled 2014-10-27: qty 1

## 2014-10-27 MED ORDER — HYDROMORPHONE HCL 1 MG/ML IJ SOLN: 0.2500 mg | INTRAMUSCULAR | Status: DC | PRN

## 2014-10-27 MED ORDER — LACTATED RINGERS IV SOLN: INTRAVENOUS | Status: DC

## 2014-10-27 MED ORDER — SODIUM CHLORIDE 0.9 % IJ SOLN: 3.0000 mL | INTRAMUSCULAR | Status: DC | PRN

## 2014-10-27 MED ORDER — OXYCODONE HCL 5 MG PO TABS: 5.0000 mg | ORAL_TABLET | ORAL | Status: DC | PRN

## 2014-10-27 MED ORDER — BUPIVACAINE-EPINEPHRINE 0.25% -1:200000 IJ SOLN
INTRAMUSCULAR | Status: DC | PRN
  Administered 2014-10-27: 30 mL

## 2014-10-27 MED ORDER — KETOROLAC TROMETHAMINE 30 MG/ML IJ SOLN
INTRAMUSCULAR | Status: AC
  Filled 2014-10-27: qty 1

## 2014-10-27 MED ORDER — ACETAMINOPHEN 650 MG RE SUPP
650.0000 mg | RECTAL | Status: DC | PRN
  Filled 2014-10-27: qty 1

## 2014-10-27 MED ORDER — OXYCODONE HCL 5 MG PO TABS
5.0000 mg | ORAL_TABLET | ORAL | Status: DC | PRN
  Administered 2014-10-27: 5 mg via ORAL
  Filled 2014-10-27: qty 1

## 2014-10-27 MED ORDER — SUCCINYLCHOLINE CHLORIDE 20 MG/ML IJ SOLN
INTRAMUSCULAR | Status: DC | PRN
  Administered 2014-10-27: 100 mg via INTRAVENOUS

## 2014-10-27 MED ORDER — POTASSIUM CHLORIDE IN NACL 20-0.9 MEQ/L-% IV SOLN: INTRAVENOUS | Status: DC

## 2014-10-27 MED ORDER — LIDOCAINE HCL (PF) 2 % IJ SOLN
INTRAMUSCULAR | Status: DC | PRN
  Administered 2014-10-27: 40 mg via INTRADERMAL

## 2014-10-27 MED ORDER — PROMETHAZINE HCL 25 MG/ML IJ SOLN: 12.5000 mg | Freq: Four times a day (QID) | INTRAMUSCULAR | Status: DC | PRN

## 2014-10-27 MED ORDER — LACTATED RINGERS IV SOLN
INTRAVENOUS | Status: DC | PRN
  Administered 2014-10-27 (×2): via INTRAVENOUS

## 2014-10-27 MED ORDER — ONDANSETRON HCL 4 MG/2ML IJ SOLN
4.0000 mg | Freq: Once | INTRAMUSCULAR | Status: AC | PRN
  Administered 2014-10-27: 4 mg via INTRAVENOUS

## 2014-10-27 MED ORDER — ONDANSETRON HCL 4 MG/2ML IJ SOLN
INTRAMUSCULAR | Status: DC | PRN
  Administered 2014-10-27: 4 mg via INTRAVENOUS

## 2014-10-27 MED ORDER — SODIUM CHLORIDE 0.9 % IJ SOLN: 3.0000 mL | Freq: Two times a day (BID) | INTRAMUSCULAR | Status: DC

## 2014-10-27 MED ORDER — NEOSTIGMINE METHYLSULFATE 10 MG/10ML IV SOLN
INTRAVENOUS | Status: DC | PRN
  Administered 2014-10-27: 3 mg via INTRAVENOUS

## 2014-10-27 MED ORDER — KETOROLAC TROMETHAMINE 30 MG/ML IJ SOLN
INTRAMUSCULAR | Status: DC | PRN
  Administered 2014-10-27: 30 mg via INTRAVENOUS

## 2014-10-27 MED ORDER — MORPHINE SULFATE 10 MG/ML IJ SOLN: 1.0000 mg | INTRAMUSCULAR | Status: DC | PRN

## 2014-10-27 MED ORDER — PROMETHAZINE HCL 25 MG/ML IJ SOLN
INTRAMUSCULAR | Status: DC
Start: 2014-10-27 — End: 2014-10-27
  Filled 2014-10-27: qty 1

## 2014-10-27 MED ORDER — ROCURONIUM BROMIDE 100 MG/10ML IV SOLN
INTRAVENOUS | Status: DC | PRN
Start: 2014-10-27 — End: 2014-10-27
  Administered 2014-10-27: 40 mg via INTRAVENOUS

## 2014-10-27 MED ORDER — BUPIVACAINE-EPINEPHRINE (PF) 0.25% -1:200000 IJ SOLN
INTRAMUSCULAR | Status: AC
  Filled 2014-10-27: qty 30

## 2014-10-27 MED ORDER — DEXTROSE 5 % IV SOLN
INTRAVENOUS | Status: AC
  Filled 2014-10-27: qty 2

## 2014-10-27 MED ORDER — FENTANYL CITRATE 0.05 MG/ML IJ SOLN
INTRAMUSCULAR | Status: AC
  Filled 2014-10-27: qty 5

## 2014-10-27 MED ORDER — IOHEXOL 300 MG/ML  SOLN
INTRAMUSCULAR | Status: DC | PRN
  Administered 2014-10-27: 6.5 mL

## 2014-10-27 MED ORDER — LIDOCAINE HCL (CARDIAC) 20 MG/ML IV SOLN
INTRAVENOUS | Status: AC
  Filled 2014-10-27: qty 5

## 2014-10-27 MED ORDER — GLYCOPYRROLATE 0.2 MG/ML IJ SOLN
INTRAMUSCULAR | Status: DC | PRN
  Administered 2014-10-27: 0.4 mg via INTRAVENOUS

## 2014-10-27 MED ORDER — PROPOFOL 10 MG/ML IV BOLUS
INTRAVENOUS | Status: AC
  Filled 2014-10-27: qty 20

## 2014-10-27 SURGICAL SUPPLY — 35 items
APPLIER CLIP 5 13 M/L LIGAMAX5 (MISCELLANEOUS) ×3
APPLIER CLIP ROT 10 11.4 M/L (STAPLE)
APR CLP MED LRG 11.4X10 (STAPLE)
APR CLP MED LRG 5 ANG JAW (MISCELLANEOUS) ×1
BAG SPEC RTRVL LRG 6X4 10 (ENDOMECHANICALS) ×1
CANISTER SUCTION 2500CC (MISCELLANEOUS) ×1 IMPLANT
CHLORAPREP W/TINT 26ML (MISCELLANEOUS) ×3 IMPLANT
CLIP APPLIE 5 13 M/L LIGAMAX5 (MISCELLANEOUS) IMPLANT
CLIP APPLIE ROT 10 11.4 M/L (STAPLE) IMPLANT
COVER MAYO STAND STRL (DRAPES) ×2 IMPLANT
DECANTER SPIKE VIAL GLASS SM (MISCELLANEOUS) ×3 IMPLANT
DRAPE C-ARM 42X120 X-RAY (DRAPES) ×2 IMPLANT
DRAPE LAPAROSCOPIC ABDOMINAL (DRAPES) ×3 IMPLANT
ELECT REM PT RETURN 9FT ADLT (ELECTROSURGICAL) ×3
ELECTRODE REM PT RTRN 9FT ADLT (ELECTROSURGICAL) ×1 IMPLANT
GLOVE BIO SURGEON STRL SZ7.5 (GLOVE) ×3 IMPLANT
GLOVE BIOGEL M STRL SZ7.5 (GLOVE) IMPLANT
GLOVE BIOGEL PI IND STRL 7.0 (GLOVE) ×1 IMPLANT
GLOVE BIOGEL PI INDICATOR 7.0 (GLOVE) ×2
GOWN STRL REUS W/TWL XL LVL3 (GOWN DISPOSABLE) ×9 IMPLANT
KIT BASIN OR (CUSTOM PROCEDURE TRAY) ×3 IMPLANT
LIQUID BAND (GAUZE/BANDAGES/DRESSINGS) ×3 IMPLANT
NS IRRIG 1000ML POUR BTL (IV SOLUTION) ×3 IMPLANT
POUCH SPECIMEN RETRIEVAL 10MM (ENDOMECHANICALS) ×3 IMPLANT
SET CHOLANGIOGRAPH MIX (MISCELLANEOUS) ×3 IMPLANT
SET IRRIG TUBING LAPAROSCOPIC (IRRIGATION / IRRIGATOR) ×3 IMPLANT
SLEEVE XCEL OPT CAN 5 100 (ENDOMECHANICALS) ×6 IMPLANT
SUT MNCRL AB 4-0 PS2 18 (SUTURE) ×3 IMPLANT
SUT VICRYL 0 UR6 27IN ABS (SUTURE) ×2 IMPLANT
TOWEL OR 17X26 10 PK STRL BLUE (TOWEL DISPOSABLE) ×3 IMPLANT
TOWEL OR NON WOVEN STRL DISP B (DISPOSABLE) ×3 IMPLANT
TRAY LAPAROSCOPIC (CUSTOM PROCEDURE TRAY) ×3 IMPLANT
TROCAR BLADELESS OPT 5 100 (ENDOMECHANICALS) ×3 IMPLANT
TROCAR XCEL BLUNT TIP 100MML (ENDOMECHANICALS) ×3 IMPLANT
TROCAR XCEL NON-BLD 11X100MML (ENDOMECHANICALS) IMPLANT

## 2014-10-27 NOTE — Transfer of Care (Signed)
Immediate Anesthesia Transfer of Care Note  Patient: Erica Lang  Procedure(s) Performed: Procedure(s) (LRB): LAPAROSCOPIC CHOLECYSTECTOMY WITH INTRAOPERATIVE CHOLANGIOGRAM (N/A)  Patient Location: PACU  Anesthesia Type: General  Level of Consciousness: sedated, patient cooperative and responds to stimulation  Airway & Oxygen Therapy: Patient Spontanous Breathing and Patient connected to face mask oxgen  Post-op Assessment: Report given to PACU RN and Post -op Vital signs reviewed and stable  Post vital signs: Reviewed and stable  Complications: No apparent anesthesia complications

## 2014-10-27 NOTE — Anesthesia Postprocedure Evaluation (Signed)
  Anesthesia Post-op Note  Patient: Erica Lang  Procedure(s) Performed: Procedure(s) (LRB): LAPAROSCOPIC CHOLECYSTECTOMY WITH INTRAOPERATIVE CHOLANGIOGRAM (N/A)  Patient Location: PACU  Anesthesia Type: General  Level of Consciousness: awake and alert   Airway and Oxygen Therapy: Patient Spontanous Breathing  Post-op Pain: mild  Post-op Assessment: Post-op Vital signs reviewed, Patient's Cardiovascular Status Stable, Respiratory Function Stable, Patent Airway and No signs of Nausea or vomiting  Last Vitals:  Filed Vitals:   10/27/14 1530  BP: 137/72  Pulse: 79  Temp:   Resp: 13    Post-op Vital Signs: stable   Complications: No apparent anesthesia complications

## 2014-10-27 NOTE — H&P (View-Only) (Signed)
Erica Lang 10/19/2014 3:00 PM Location: [facility] Patient #: [id number] DOB: 30-Jul-1976 Married / Language: English / Race: Black or African American Female  History of Present Illness Erica Lang M. [name] MD; 10/20/2014 10:13 PM) Patient words: f/u post op gallbladder.  The patient is a 38 year old female who presents for evaluation of gall stones. 38 year old [place] American female referred by Dr. [name] for evaluation of gallbladder disease. The patient states that for the past one month she has had intermittent episodes of abdominal pain. Initially they were infrequent and would last for several hours they're becoming more frequent and more intense. She describes the pain as a sharp pain it'll sometimes radiate to her back. It is mainly on the right side. Often times it is associated with nausea and vomiting. The last episode which was the most severe prompted her to go to the emergency room in early October. She ended up going to the emergency room because the discomfort was not relenting. There she was found to have gallstones on abdominal ultrasound. She also had some mild AST and ALT elevation as well. There is no signs of cholecystitis on ultrasound. She was given pain medication and nausea medicine and sent home to followup with [name]. She denies any recent fevers or chills. She still reports some intermittent episodes of abdominal pain with nausea. However she denies any vomiting. She reports that she is out of pain medication. She denies any jaundice. She denies any fever or chills. She denies any melena or hematochezia. She denies any NSAID use.   Other Problems [name], LPN; 40/98/1191 3:23 PM) Asthma  Past Surgical History [name], LPN; 47/82/9562 3:23 PM) Oral Surgery  Diagnostic Studies History [name], LPN; 13/07/6577 3:23 PM) Colonoscopy never Mammogram never Pap Smear 1-5 years ago  Allergies [name], LPN; 46/96/2952 3:25  PM) BEE VENOM  Medication History [name], LPN; 84/13/2440 3:26 PM) Zofran ODT (4MG  Tablet Disperse, Oral) Active. Albuterol Sulfate (108 (90 Base)MCG/ACT Aero Pow Br Act, Inhalation) Active. Medications Reconciled  Social History [name], LPN; 10/25/2535 3:23 PM) Caffeine use Tea. No alcohol use No drug use Tobacco use Never smoker.  Family History [name], LPN; 64/40/3474 3:23 PM) Cerebrovascular Accident Mother. Heart disease in female family member before age 69 Hypertension Mother. Migraine Headache Mother.  Pregnancy / Birth History [name], LPN; 25/95/6387 3:23 PM) Age at menarche 14 years. Contraceptive History Depo-provera, Intrauterine device. Gravida 3 Maternal age 38-35 Para 2 Regular periods  Review of Systems [name] LPN; 56/43/3295 3:23 PM) General Not Present- Appetite Loss, Chills, Fatigue, Fever, Night Sweats, Weight Gain and Weight Loss. Skin Not Present- Change in Wart/Mole, Dryness, Hives, Jaundice, New Lesions, Non-Healing Wounds, Rash and Ulcer. HEENT Not Present- Earache, Hearing Loss, Hoarseness, Nose Bleed, Oral Ulcers, Ringing in the Ears, Seasonal Allergies, Sinus Pain, Sore Throat, Visual Disturbances, Wears glasses/contact lenses and Yellow Eyes. Respiratory Present- Snoring. Not Present- Bloody sputum, Chronic Cough, Difficulty Breathing and Wheezing. Breast Not Present- Breast Mass, Breast Pain, Nipple Discharge and Skin Changes. Gastrointestinal Present- Bloating, Indigestion, Nausea and Vomiting. Not Present- Abdominal Pain, Bloody Stool, Change in Bowel Habits, Chronic diarrhea, Constipation, Difficulty Swallowing, Excessive gas, Gets full quickly at meals, Hemorrhoids and Rectal Pain. Female Genitourinary Not Present- Frequency, Nocturia, Painful Urination, Pelvic Pain and Urgency. Musculoskeletal Not Present- Back Pain, Joint Pain, Joint Stiffness, Muscle Pain, Muscle Weakness and  Swelling of Extremities. Psychiatric Not Present- Anxiety, Bipolar, Change in Sleep Pattern, Depression, Fearful and Frequent crying. Endocrine  Not Present- Cold Intolerance, Excessive Hunger, Hair Changes, Heat Intolerance, Hot flashes and New Diabetes. Hematology Not Present- Easy Bruising, Excessive bleeding, Gland problems, HIV and Persistent Infections.   Vitals [name]([name] LPN; 09/81/191410/22/2015 3:26 PM) 10/19/2014 3:26 PM Weight: 239 lb Height: 69.5in Body Surface Area: 2.31 m Body Mass Index: 34.79 kg/m Pain Level: 8/10 Temp.: 97.68F(Temporal)  Pulse: 74 (Regular)  Resp.: 18 (Unlabored)  BP: 128/88 (Sitting, Left Arm, Standard)    Physical Exam Erica Lang([name] M. [name] MD; 10/20/2014 10:09 PM) General Mental Status-Alert. General Appearance-Consistent with stated age. Hydration-Well hydrated. Voice-Normal.  Head and Neck Head-normocephalic, atraumatic with no lesions or palpable masses. Trachea-midline. Thyroid Gland Characteristics - normal size and consistency.  Eye Eyeball - Bilateral-Extraocular movements intact. Sclera/Conjunctiva - Bilateral-No scleral icterus.  Chest and Lung Exam Chest and lung exam reveals -quiet, even and easy respiratory effort with no use of accessory muscles and on auscultation, normal breath sounds, no adventitious sounds and normal vocal resonance. Inspection Chest Wall - Normal. Back - normal.  Breast - Did not examine.  Cardiovascular Cardiovascular examination reveals -normal heart sounds, regular rate and rhythm with no murmurs and normal pedal pulses bilaterally.  Abdomen Inspection Inspection of the abdomen reveals - No Hernias. Skin - Scar - no surgical scars. Palpation/Percussion Palpation and Percussion of the abdomen reveal - Soft, No Rebound tenderness, No Rigidity (guarding) and No hepatosplenomegaly. Note: MILD TTP IN RUQ. Auscultation Auscultation of the abdomen reveals - Bowel sounds  normal.  Peripheral Vascular Upper Extremity Palpation - Pulses bilaterally normal.  Neurologic Neurologic evaluation reveals -alert and oriented x 3 with no impairment of recent or remote memory. Mental Status-Normal.  Neuropsychiatric The patient's mood and affect are described as -normal. Judgment and Insight-insight is appropriate concerning matters relevant to self.  Musculoskeletal Normal Exam - Left-Upper Extremity Strength Normal and Lower Extremity Strength Normal. Normal Exam - Right-Upper Extremity Strength Normal and Lower Extremity Strength Normal.  Lymphatic Head & Neck  General Head & Neck Lymphatics: Bilateral - Description - Normal. Axillary - Did not examine. Femoral & Inguinal - Did not examine.    Assessment & Plan Erica Lang(Marisal Swarey M. Leng Montesdeoca MD; 10/20/2014 10:10 PM) SYMPTOMATIC CHOLELITHIASIS (574.20  K80.20) Impression: I believe her symptoms are consistent with gallstone disease. She would like to proceed with surgery. I'm going to try to get her on the schedule as soon as possible some concern that she may end up back in the emergency room for ongoing symptoms. Currently there is no signs of acute cholecystitis.  I believe the patient's symptoms are consistent with gallbladder disease.  We discussed gallbladder disease. The patient was given Agricultural engineereducational material. We discussed non-operative and operative management. We discussed the signs & symptoms of acute cholecystitis  I discussed laparoscopic cholecystectomy with IOC in detail. The patient was given educational material as well as diagrams detailing the procedure. We discussed the risks and benefits of a laparoscopic cholecystectomy including, but not limited to bleeding, infection, injury to surrounding structures such as the intestine or liver, bile leak, retained gallstones, need to convert to an open procedure, prolonged diarrhea, blood clots such as DVT, common bile duct injury, anesthesia risks,  and possible need for additional procedures. We discussed the typical post-operative recovery course. I explained that the likelihood of improvement of their symptoms is good.  The patient has elected to proceed with surgery. Current Plans  Pt Education - Cholecystectomy: Gallbladder Removal with Open Surgery: laparoscopic cholecystectomy Schedule for Surgery Started OxyCODONE HCl 5MG , 1 (one) Tablet four  times daily, as needed, #30, 10/19/2014, No Refill. Started Zofran ODT 4MG , 1 (one) Tablet Disperse every four hours, as needed, #20, 10/19/2014, No Refill.  Mary SellaEric M. Andrey CampanileWilson, MD, FACS General, Bariatric, & Minimally Invasive Surgery [facility], [place]

## 2014-10-27 NOTE — Discharge Instructions (Signed)
[facility] °LAPAROSCOPIC SURGERY: POST OP INSTRUCTIONS °Always review your discharge instruction sheet given to you by the facility where your surgery was performed. °IF YOU HAVE DISABILITY OR FAMILY LEAVE FORMS, YOU MUST BRING THEM TO THE OFFICE FOR PROCESSING.   °DO NOT GIVE THEM TO YOUR DOCTOR. ° °1. A prescription for pain medication may be given to you upon discharge.  Take your pain medication as prescribed, if needed.  If narcotic pain medicine is not needed, then you may take acetaminophen (Tylenol) or ibuprofen (Advil) as needed. °2. Take your usually prescribed medications unless otherwise directed. °3. If you need a refill on your pain medication, please contact your pharmacy.  They will contact our office to request authorization. Prescriptions will not be filled after 5pm or on week-ends. °4. You should follow a light diet the first few days after arrival home, such as soup and crackers, etc.  Be sure to include lots of fluids daily. °5. Most patients will experience some swelling and bruising in the area of the incisions.  Ice packs will help.  Swelling and bruising can take several days to resolve.  °6. It is common to experience some constipation if taking pain medication after surgery.  Increasing fluid intake and taking a stool softener (such as Colace) will usually help or prevent this problem from occurring.  A mild laxative (Milk of Magnesia or Miralax) should be taken according to package instructions if there are no bowel movements after 48 hours. °7.  If your surgeon used skin glue on the incision, you may shower in 24 hours.  The glue will flake off over the next 2-3 weeks.  Any sutures or staples will be removed at the office during your follow-up visit. °8. ACTIVITIES:  You may resume regular (light) daily activities beginning the next day--such as daily self-care, walking, climbing stairs--gradually increasing activities as tolerated.  You may have sexual  intercourse when it is comfortable.  Refrain from any heavy lifting or straining until approved by your doctor. °a. You may drive when you are no longer taking prescription pain medication, you can comfortably wear a seatbelt, and you can safely maneuver your car and apply brakes. °9. You should see your doctor in the office for a follow-up appointment approximately 2-3 weeks after your surgery.  Make sure that you call for this appointment within a day or two after you arrive home to insure a convenient appointment time. °10. OTHER INSTRUCTIONS:  °WHEN TO CALL YOUR DOCTOR: °1. Fever over 101.0 °2. Inability to urinate °3. Continued bleeding from incision. °4. Increased pain, redness, or drainage from the incision. °5. Increasing abdominal pain ° °The clinic staff is available to answer your questions during regular business hours.  Please don’t hesitate to call and ask to speak to one of the nurses for clinical concerns.  If you have a medical emergency, go to the nearest emergency room or call 911.  A surgeon from [facility] is always on call at the hospital. °[address], [address], [place], [place]  [address] ? [address] [address], [place], [place]   [address] °[phone] ? [phone] ? FAX [phone] °Web site: [email or web address] ° ° ° ° ° °General Anesthesia, Care After °Refer to this sheet in the next few weeks. These instructions provide you with information on caring for yourself after your procedure. Your health care provider may also give you more specific instructions. Your treatment has been planned according to current medical practices, but   problems sometimes occur. Call your health care provider if you have any problems or questions after your procedure. °WHAT TO EXPECT AFTER THE PROCEDURE °After the procedure, it is typical to experience: °· Sleepiness. °· Nausea and vomiting. °HOME CARE INSTRUCTIONS °· For the first 24 hours after general anesthesia: °¨ Have a  responsible person with you. °¨ Do not drive a car. If you are alone, do not take public transportation. °¨ Do not drink alcohol. °¨ Do not take medicine that has not been prescribed by your health care provider. °¨ Do not sign important papers or make important decisions. °¨ You may resume a normal diet and activities as directed by your health care provider. °· Change bandages (dressings) as directed. °· If you have questions or problems that seem related to general anesthesia, call the hospital and ask for the anesthetist or anesthesiologist on call. °SEEK MEDICAL CARE IF: °· You have nausea and vomiting that continue the day after anesthesia. °· You develop a rash. °SEEK IMMEDIATE MEDICAL CARE IF:  °· You have difficulty breathing. °· You have chest pain. °· You have any allergic problems. °Document Released: 03/23/2001 Document Revised: 12/20/2013 Document Reviewed: 06/30/2013 °ExitCare® Patient Information ©2015 [employer]. This information is not intended to replace advice given to you by your health care provider. Make sure you discuss any questions you have with your health care provider. ° °

## 2014-10-27 NOTE — Interval H&P Note (Signed)
History and Physical Interval Note:  10/27/2014 1:07 PM  Erica Lang  has presented today for surgery, with the diagnosis of Symptomatic Cholelithisis  The various methods of treatment have been discussed with the patient and family. After consideration of risks, benefits and other options for treatment, the patient has consented to  Procedure(s): LAPAROSCOPIC CHOLECYSTECTOMY WITH INTRAOPERATIVE CHOLANGIOGRAM (N/A) as a surgical intervention .  The patient's history has been reviewed, patient examined, no change in status, stable for surgery.  I have reviewed the patient's chart and labs.  Questions were answered to the patient's satisfaction.    Mary SellaEric M. Andrey CampanileWilson, MD, FACS General, Bariatric, & Minimally Invasive Surgery [facility], [place]   [facility]ILSON,Keian Odriscoll M

## 2014-10-27 NOTE — Op Note (Signed)
Erica Lang [id number] 06/15/1976 10/27/2014  Laparoscopic Cholecystectomy with IOC Procedure Note  Indications: This patient presents with symptomatic gallbladder disease and will undergo laparoscopic cholecystectomy.  Pre-operative Diagnosis: Calculus of gallbladder with other cholecystitis, without mention of obstruction  Post-operative Diagnosis: Same  Surgeon: Atilano InaWILSON,Jerilee Space M   Assistants: none  Anesthesia: General endotracheal anesthesia  ASA Class: 2  Procedure Details  The patient was seen again in the Holding Room. The risks, benefits, complications, treatment options, and expected outcomes were discussed with the patient. The possibilities of reaction to medication, pulmonary aspiration, perforation of viscus, bleeding, recurrent infection, finding a normal gallbladder, the need for additional procedures, failure to diagnose a condition, the possible need to convert to an open procedure, and creating a complication requiring transfusion or operation were discussed with the patient. The likelihood of improving the patient's symptoms with return to their baseline status is good.  The patient and/or family concurred with the proposed plan, giving informed consent. The site of surgery properly noted. The patient was taken to Operating Room, identified as Erica Lang and the procedure verified as Laparoscopic Cholecystectomy with Intraoperative Cholangiogram. A Time Out was held and the above information confirmed. Antibiotic prophylaxis was administered.   Prior to the induction of general anesthesia, antibiotic prophylaxis was administered. General endotracheal anesthesia was then administered and tolerated well. After the induction, the abdomen was prepped with Chloraprep and draped in the sterile fashion. The patient was positioned in the supine position.  Local anesthetic agent was injected into the skin near the umbilicus and an incision made. We dissected down to the abdominal  fascia with blunt dissection.  The fascia was incised vertically and we entered the peritoneal cavity bluntly.  A pursestring suture of 0-Vicryl was placed around the fascial opening.  The Hasson cannula was inserted and secured with the stay suture.  Pneumoperitoneum was then created with CO2 and tolerated well without any adverse changes in the patient's vital signs. An 5-mm port was placed in the subxiphoid position.  Two 5-mm ports were placed in the right upper quadrant. All skin incisions were infiltrated with a local anesthetic agent before making the incision and placing the trocars.   We positioned the patient in reverse Trendelenburg, tilted slightly to the patient's left.  The gallbladder was identified, the fundus grasped and retracted cephalad. There were a few omental adhesions laterally on the gallbladder which were lysed bluntly and with the electrocautery where indicated, taking care not to injure any adjacent organs or viscus. The infundibulum was grasped and retracted laterally, exposing the peritoneum overlying the triangle of Calot. This was then divided and exposed in a blunt fashion. A critical view of the cystic duct and cystic artery was obtained.  The cystic duct was clearly identified and bluntly dissected circumferentially. The cystic duct was ligated with a clip distally.   An incision was made in the cystic duct and the [facility] cholangiogram catheter introduced. The catheter was secured using a clip. A cholangiogram was then obtained which showed good visualization of the distal and proximal biliary tree with no sign of filling defects or obstruction.  Contrast flowed easily into the duodenum. The catheter was then removed.   The cystic duct was then ligated with 3 clips on biliary side and divided. The cystic artery which had been identified & dissected free was ligated with clips and divided as well.   The gallbladder was dissected from the liver bed in retrograde fashion with the  electrocautery. There was some  spillage of bile from the gallbladder but no gallstone spillage.  The gallbladder was removed and placed in an Endocatch sac.  The gallbladder and Endocatch sac were then removed through the umbilical port site. The liver bed was irrigated and inspected. Hemostasis was achieved with the electrocautery. Copious irrigation was utilized and was repeatedly aspirated until clear.  The pursestring suture was used to close the umbilical fascia.  I elected to place an additional 0 vicryl interrrupted suture at the umbilical fascia.   We again inspected the right upper quadrant for hemostasis.  The umbilical closure was inspected and there was no air leak and nothing trapped within the closure. Pneumoperitoneum was released as we removed the trocars.  4-0 Monocryl was used to close the skin.   Dermabond was applied. The patient was then extubated and brought to the recovery room in stable condition. Instrument, sponge, and needle counts were correct at closure and at the conclusion of the case.   Findings: Chronic Cholecystitis with Cholelithiasis  Estimated Blood Loss: Minimal         Drains: none         Specimens: Gallbladder           Complications: None; patient tolerated the procedure well.         Disposition: PACU - hemodynamically stable.         Condition: stable  Mary SellaEric M. Andrey CampanileWilson, MD, FACS General, Bariatric, & Minimally Invasive Surgery [facility], [place]

## 2014-10-30 ENCOUNTER — Encounter (HOSPITAL_COMMUNITY): Payer: Self-pay | Admitting: General Surgery

## 2014-11-07 ENCOUNTER — Telehealth (INDEPENDENT_AMBULATORY_CARE_PROVIDER_SITE_OTHER): Payer: Self-pay

## 2014-11-07 NOTE — Telephone Encounter (Signed)
Pt called to report still having some pain.  She is s/p lap choley 10/30.  She had fever on 11/04 101.8.  It has improved and was 99.8 yesterday.  She is out of Oxycodone 5 mg (#30 given 10/30).  She has intermittent nausea and pain is 7 of 10 with movement.  Advised her we will get message to her when we receive a response from Dr.  She verbalized understanding.  SL

## 2014-11-08 ENCOUNTER — Other Ambulatory Visit (INDEPENDENT_AMBULATORY_CARE_PROVIDER_SITE_OTHER): Payer: Self-pay | Admitting: Surgery

## 2014-11-08 DIAGNOSIS — Z9049 Acquired absence of other specified parts of digestive tract: Secondary | ICD-10-CM

## 2014-11-10 ENCOUNTER — Telehealth (INDEPENDENT_AMBULATORY_CARE_PROVIDER_SITE_OTHER): Payer: Self-pay

## 2014-11-10 NOTE — Telephone Encounter (Signed)
Called Erica Lang to see if she went to have her blood work drawn. Erica Lang went on 11/08/14 after her appt with Dr Corliss Skainssuei. [facility]. All results looked normal. Erica Lang states that she still has some nausea but other than that she does feel somewhat better. Informed Dr Andrey CampanileWilson of the lab results. Informed Erica Lang that if she needed anything just give our office a call.

## 2015-01-05 ENCOUNTER — Emergency Department (HOSPITAL_COMMUNITY): Payer: 59

## 2015-01-05 ENCOUNTER — Emergency Department (HOSPITAL_COMMUNITY)
Admission: EM | Admit: 2015-01-05 | Discharge: 2015-01-05 | Disposition: A | Discharge status: Home / Self Care | Payer: 59 | Attending: Emergency Medicine | Admitting: Emergency Medicine

## 2015-01-05 ENCOUNTER — Encounter (HOSPITAL_COMMUNITY): Payer: Self-pay

## 2015-01-05 DIAGNOSIS — E669 Obesity, unspecified: Secondary | ICD-10-CM | POA: Diagnosis not present

## 2015-01-05 DIAGNOSIS — Z79899 Other long term (current) drug therapy: Secondary | ICD-10-CM | POA: Diagnosis not present

## 2015-01-05 DIAGNOSIS — J45909 Unspecified asthma, uncomplicated: Secondary | ICD-10-CM | POA: Insufficient documentation

## 2015-01-05 DIAGNOSIS — Z8659 Personal history of other mental and behavioral disorders: Secondary | ICD-10-CM | POA: Insufficient documentation

## 2015-01-05 DIAGNOSIS — R112 Nausea with vomiting, unspecified: Secondary | ICD-10-CM | POA: Diagnosis not present

## 2015-01-05 DIAGNOSIS — R05 Cough: Secondary | ICD-10-CM | POA: Insufficient documentation

## 2015-01-05 DIAGNOSIS — M545 Low back pain: Secondary | ICD-10-CM | POA: Diagnosis not present

## 2015-01-05 DIAGNOSIS — Z3202 Encounter for pregnancy test, result negative: Secondary | ICD-10-CM | POA: Insufficient documentation

## 2015-01-05 DIAGNOSIS — R1110 Vomiting, unspecified: Secondary | ICD-10-CM

## 2015-01-05 DIAGNOSIS — Z8719 Personal history of other diseases of the digestive system: Secondary | ICD-10-CM | POA: Insufficient documentation

## 2015-01-05 DIAGNOSIS — I10 Essential (primary) hypertension: Secondary | ICD-10-CM | POA: Insufficient documentation

## 2015-01-05 DIAGNOSIS — R509 Fever, unspecified: Secondary | ICD-10-CM | POA: Diagnosis present

## 2015-01-05 DIAGNOSIS — N12 Tubulo-interstitial nephritis, not specified as acute or chronic: Secondary | ICD-10-CM | POA: Insufficient documentation

## 2015-01-05 DIAGNOSIS — R630 Anorexia: Secondary | ICD-10-CM | POA: Insufficient documentation

## 2015-01-05 DIAGNOSIS — Z872 Personal history of diseases of the skin and subcutaneous tissue: Secondary | ICD-10-CM | POA: Insufficient documentation

## 2015-01-05 DIAGNOSIS — R51 Headache: Secondary | ICD-10-CM | POA: Diagnosis not present

## 2015-01-05 DIAGNOSIS — Z862 Personal history of diseases of the blood and blood-forming organs and certain disorders involving the immune mechanism: Secondary | ICD-10-CM | POA: Diagnosis not present

## 2015-01-05 DIAGNOSIS — R059 Cough, unspecified: Secondary | ICD-10-CM

## 2015-01-05 LAB — CBC WITH DIFFERENTIAL/PLATELET
BASOS ABS: 0 10*3/uL (ref 0.0–0.1)
Basophils Relative: 0 % (ref 0–1)
EOS ABS: 0 10*3/uL (ref 0.0–0.7)
EOS PCT: 0 % (ref 0–5)
HCT: 34.9 % — ABNORMAL LOW (ref 36.0–46.0)
Hemoglobin: 11.2 g/dL — ABNORMAL LOW (ref 12.0–15.0)
LYMPHS ABS: 1.3 10*3/uL (ref 0.7–4.0)
LYMPHS PCT: 14 % (ref 12–46)
MCH: 29.6 pg (ref 26.0–34.0)
MCHC: 32.1 g/dL (ref 30.0–36.0)
MCV: 92.3 fL (ref 78.0–100.0)
Monocytes Absolute: 1.2 10*3/uL — ABNORMAL HIGH (ref 0.1–1.0)
Monocytes Relative: 13 % — ABNORMAL HIGH (ref 3–12)
NEUTROS ABS: 6.7 10*3/uL (ref 1.7–7.7)
NEUTROS PCT: 73 % (ref 43–77)
Platelets: 216 10*3/uL (ref 150–400)
RBC: 3.78 MIL/uL — ABNORMAL LOW (ref 3.87–5.11)
RDW: 14.3 % (ref 11.5–15.5)
WBC: 9.1 10*3/uL (ref 4.0–10.5)

## 2015-01-05 LAB — COMPREHENSIVE METABOLIC PANEL
ALBUMIN: 3.8 g/dL (ref 3.5–5.2)
ALK PHOS: 59 U/L (ref 39–117)
ALT: 25 U/L (ref 0–35)
AST: 26 U/L (ref 0–37)
Anion gap: 13 (ref 5–15)
BUN: 16 mg/dL (ref 6–23)
CALCIUM: 8.9 mg/dL (ref 8.4–10.5)
CO2: 21 mmol/L (ref 19–32)
Chloride: 104 mEq/L (ref 96–112)
Creatinine, Ser: 0.78 mg/dL (ref 0.50–1.10)
GFR calc non Af Amer: >90 mL/min (ref >90)
Glucose, Bld: 90 mg/dL (ref 70–99)
Potassium: 3.4 mmol/L — ABNORMAL LOW (ref 3.5–5.1)
SODIUM: 138 mmol/L (ref 135–145)
Total Bilirubin: 0.8 mg/dL (ref 0.3–1.2)
Total Protein: 8.1 g/dL (ref 6.0–8.3)

## 2015-01-05 LAB — URINALYSIS, ROUTINE W REFLEX MICROSCOPIC
Bilirubin Urine: NEGATIVE
Glucose, UA: NEGATIVE mg/dL
Ketones, ur: 40 mg/dL — AB
NITRITE: POSITIVE — AB
Protein, ur: 30 mg/dL — AB
Specific Gravity, Urine: 1.018 (ref 1.005–1.030)
Urobilinogen, UA: 1 mg/dL (ref 0.0–1.0)
pH: 6 (ref 5.0–8.0)

## 2015-01-05 LAB — URINE MICROSCOPIC-ADD ON

## 2015-01-05 LAB — POC URINE PREG, ED: PREG TEST UR: NEGATIVE

## 2015-01-05 LAB — I-STAT CG4 LACTIC ACID, ED: Lactic Acid, Venous: 1.02 mmol/L (ref 0.5–2.2)

## 2015-01-05 MED ORDER — SODIUM CHLORIDE 0.9 % IV BOLUS (SEPSIS)
1000.0000 mL | Freq: Once | INTRAVENOUS | Status: AC
  Administered 2015-01-05: 1000 mL via INTRAVENOUS

## 2015-01-05 MED ORDER — CIPROFLOXACIN HCL 500 MG PO TABS
500.0000 mg | ORAL_TABLET | Freq: Once | ORAL | Status: AC
  Administered 2015-01-05: 500 mg via ORAL
  Filled 2015-01-05: qty 1

## 2015-01-05 MED ORDER — CEFPODOXIME PROXETIL 100 MG PO TABS: 100.0000 mg | ORAL_TABLET | Freq: Two times a day (BID) | ORAL | Status: DC

## 2015-01-05 MED ORDER — ONDANSETRON HCL 4 MG/2ML IJ SOLN
4.0000 mg | Freq: Once | INTRAMUSCULAR | Status: AC
  Administered 2015-01-05: 4 mg via INTRAVENOUS
  Filled 2015-01-05: qty 2

## 2015-01-05 MED ORDER — KETOROLAC TROMETHAMINE 30 MG/ML IJ SOLN
30.0000 mg | Freq: Once | INTRAMUSCULAR | Status: AC
  Administered 2015-01-05: 30 mg via INTRAVENOUS
  Filled 2015-01-05: qty 1

## 2015-01-05 MED ORDER — HYDROCODONE-ACETAMINOPHEN 5-325 MG PO TABS: 1.0000 | ORAL_TABLET | ORAL | Status: DC | PRN

## 2015-01-05 MED ORDER — ACETAMINOPHEN 325 MG PO TABS
650.0000 mg | ORAL_TABLET | Freq: Four times a day (QID) | ORAL | Status: DC | PRN
  Administered 2015-01-05: 650 mg via ORAL
  Filled 2015-01-05: qty 2

## 2015-01-05 NOTE — ED Notes (Signed)
Pt reports that she is having a "heavy cycle right now and will just pee blood." Verlon AuLeslie, NT to notify Tori, [place] to advise whether an I&O in necessary

## 2015-01-05 NOTE — Discharge Instructions (Signed)
Return to the emergency room with worsening of symptoms, new symptoms or with symptoms that are concerning, especially high fevers not controlled with tylenol, unable to tolerate fluids by mouth, severe abdominal pain, flank pain, blood in urine or stool. Please take all of your antibiotics until finished!   You may develop abdominal discomfort or diarrhea from the antibiotic.  You may help offset this with probiotics which you can buy or get in yogurt. Do not eat  or take the probiotics until 2 hours after your antibiotic.  Reviewed the below information and follow-up recommendations.  Pyelonephritis, Adult Pyelonephritis is a kidney infection. In general, there are 2 main types of pyelonephritis:  Infections that come on quickly without any warning (acute pyelonephritis).  Infections that persist for a long period of time (chronic pyelonephritis). CAUSES  Two main causes of pyelonephritis are:  Bacteria traveling from the bladder to the kidney. This is a problem especially in pregnant women. The urine in the bladder can become filled with bacteria from multiple causes, including:  Inflammation of the prostate gland (prostatitis).  Sexual intercourse in females.  Bladder infection (cystitis).  Bacteria traveling from the bloodstream to the tissue part of the kidney. Problems that may increase your risk of getting a kidney infection include:  Diabetes.  Kidney stones or bladder stones.  Cancer.  Catheters placed in the bladder.  Other abnormalities of the kidney or ureter. SYMPTOMS   Abdominal pain.  Pain in the side or flank area.  Fever.  Chills.  Upset stomach.  Blood in the urine (dark urine).  Frequent urination.  Strong or persistent urge to urinate.  Burning or stinging when urinating. DIAGNOSIS  Your caregiver may diagnose your kidney infection based on your symptoms. A urine sample may also be taken. TREATMENT  In general, treatment depends on how  severe the infection is.   If the infection is mild and caught early, your caregiver may treat you with oral antibiotics and send you home.  If the infection is more severe, the bacteria may have gotten into the bloodstream. This will require intravenous (IV) antibiotics and a hospital stay. Symptoms may include:  High fever.  Severe flank pain.  Shaking chills.  Even after a hospital stay, your caregiver may require you to be on oral antibiotics for a period of time.  Other treatments may be required depending upon the cause of the infection. HOME CARE INSTRUCTIONS   Take your antibiotics as directed. Finish them even if you start to feel better.  Make an appointment to have your urine checked to make sure the infection is gone.  Drink enough fluids to keep your urine clear or pale yellow.  Take medicines for the bladder if you have urgency and frequency of urination as directed by your caregiver. SEEK IMMEDIATE MEDICAL CARE IF:   You have a fever or persistent symptoms for more than 2-3 days.  You have a fever and your symptoms suddenly get worse.  You are unable to take your antibiotics or fluids.  You develop shaking chills.  You experience extreme weakness or fainting.  There is no improvement after 2 days of treatment. MAKE SURE YOU:  Understand these instructions.  Will watch your condition.  Will get help right away if you are not doing well or get worse. Document Released: 12/15/2005 Document Revised: 06/15/2012 Document Reviewed: 05/21/2011 [facility] Patient Information 2015 [place], [place]. This information is not intended to replace advice given to you by your health care provider. Make sure  you discuss any questions you have with your health care provider.   Please call your doctor for a followup appointment within 24-48 hours. When you talk to your doctor please let them know that you were seen in the emergency department and have them acquire all of your  records so that they can discuss the findings with you and formulate a treatment plan to fully care for your new and ongoing problems. If you do not have a primary care provider please call the number below under ED resources to establish care with a provider and follow up.    Emergency Department Resource Guide 1) Find a Doctor and Pay Out of Pocket Although you won't have to find out who is covered by your insurance plan, it is a good idea to ask around and get recommendations. You will then need to call the office and see if the doctor you have chosen will accept you as a new patient and what types of options they offer for patients who are self-pay. Some doctors offer discounts or will set up payment plans for their patients who do not have insurance, but you will need to ask so you aren't surprised when you get to your appointment.  2) Contact Your Local Health Department Not all health departments have doctors that can see patients for sick visits, but many do, so it is worth a call to see if yours does. If you don't know where your local health department is, you can check in your phone book. The CDC also has a tool to help you locate your state's health department, and many state websites also have listings of all of their local health departments.  3) Find a Walk-in Clinic If your illness is not likely to be very severe or complicated, you may want to try a walk in clinic. These are popping up all over the country in pharmacies, drugstores, and shopping centers. They're usually staffed by nurse practitioners or physician assistants that have been trained to treat common illnesses and complaints. They're usually fairly quick and inexpensive. However, if you have serious medical issues or chronic medical problems, these are probably not your best option.  No Primary Care Doctor: - Call Health Connect at  [phone] - they can help you locate a primary care doctor that  accepts your insurance,  provides certain services, etc. - Physician Referral Service- [phone]  Chronic Pain Problems: Organization         Address  Phone   Notes  [facility]  [phone] Patients need to be referred by their primary care doctor.   Medication Assistance: Organization         Address  Phone   Notes  [facility] Medication [facility] [address] [place] [place], [place] [address] [phone] --Must be a resident of [facility] -- Must have NO insurance coverage whatsoever (no Medicaid/ Medicare, etc.) -- The pt. MUST have a primary care doctor that directs their care regularly and follows them in the community   Med[facility]ist  [phone]   [employer]  [phone]    Agencies that provide inexpensive medical care: Organization         Address  Phone   Notes  [facility]  [phone]   [facility]    [phone]   [facility] [address] [place], [place] [address] [phone]   [facility]  Lovenia Shuck, [place] [phone]   Planned Parenthood    [phone]   [facility]    [phone]   [facility]  [address], [place] Phone:  [phone], Fax:  [phone] Hours of Operation:  9 am - 6 pm, M-F.  Also accepts Medicaid/Medicare and self-pay.  [facility]  [address], [place] Phone: [phone], Fax: [phone]. Hours of Operation:  8:30 am - 5:30 pm, M-F.  Also accepts Medicaid and self-pay.  [facility] [place] [address] [place] Phone: [phone]   [facility] [address] Natasha Bence [place], [place] [phone], Ext. [phone] Mondays & Thursdays: 7-9 AM.  First 15 patients are seen on a first come, first serve basis.    Medicaid-accepting [facility] Providers:  Organization         Address  Phone    Notes  [facility] [address], [place] [phone] Also accepts self-pay patients.  [facility] [address] Laurell Josephs [employer] [place], [place]  [phone]   [facility] [address], [place] [phone]   [facility] [address], [place] [phone]   Renaye Rakers [address], [place]   [phone] Only accepts [place] patients after they have their name applied to their card.   Self-Pay (no insurance) in [facility]:  Organization         Address  Phone   Notes  Sickle Cell Patients, [facility] Internal Medicine [address] [place], [place] [phone]   [facility] [address] [place], [place] [phone]   [facility] [place]  [address], [place] [phone]   [facility]/Dr. Osei-Bonsu  [address] [place] or [address], [place] [phone] Phone number for both [place] and [place] locations is the same.  [facility] [address] [place] [phone]   [facility] [address] [place] or [address] [phone]   [facility] [address], [place] [phone], phone; [phone], fax Sees patients 1st and 3rd Saturday of every month.  Must not qualify for public or private insurance (i.e. Medicaid, Medicare, [place] Health Choice, Veterans' Benefits)  Household income should be no more than 200% of the poverty level The clinic cannot treat you if you are pregnant or think you are pregnant  Sexually transmitted diseases are not treated at the clinic.    Dental Care: Organization         Address  Phone  Notes  [facility] Department of [facility] [address] [place], [place] [phone] Accepts children up to age 10 who are enrolled in [place] or [place] Health Choice; pregnant women with a Medicaid card; and children who have applied for Medicaid or [place] Health Choice, but were declined, whose parents can pay a reduced fee at time of service.  [facility] Department of [facility]  [address], [place] [phone] Accepts children up to age 16 who are enrolled in [place] or [place] Health Choice; pregnant women with a Medicaid card; and children who have applied for  Medicaid or [place] Health Choice, but were declined, whose parents can pay a reduced fee at time of service.  [facility]  [address] [place], [place] [phone] Patients are seen by appointment only. Walk-ins are not accepted. [facility] will see patients 80 years of age and older. Monday - Tuesday (8am-5pm) Most Wednesdays (8:30-5pm) $30 per visit, cash only  [facility] Adult Dental Access PRO[facility]  [address], [facility] [phone] Patients are seen by appointment only. Walk-ins are not accepted. [facility] will see patients 59 years of age and older. One Wednesday Evening (Monthly: Volunteer Based).  $30 per visit, cash only  [employer]  [phone] for adults; Children under age 60, call Graduate Pediatric Dentistry at [phone]. Children aged 25-14, please call [phone] to request a pediatric application.  Dental services are provided in all areas of dental care including fillings, crowns and bridges, complete and partial dentures, implants, gum treatment, root canals, and extractions. Preventive care is also provided. Treatment is provided to both adults and children. Patients are selected via a lottery and there is often a waiting list.   [facility] [address] [place]  [phone] [email or web address]   [facility] [address] [place], [place] [phone], Ext.  123 Second and Fourth Thursday of each month, opens at 6:30 AM; Clinic ends at 9 AM.  Patients are seen on a first-come first-served basis, and a limited number are seen during each clinic.   [facility]  [address] Ether Griffins [place], [place] [phone]   Eligibility Requirements You must have lived in [place], [place], or [place] counties for at least the last three months.   You cannot be eligible for state or federal sponsored [place], including [employer], [place], or [employer].   You generally cannot be eligible for healthcare insurance through your employer.    How to apply: Eligibility screenings are held every Tuesday and Wednesday afternoon from 1:00 pm until 4:00 pm. You do not need an appointment for the interview!  [facility] [address] [place], [place] [phone]   [facility] Health Department  [phone]   [facility] Health Department  [phone]   [facility] Health Department  [phone]    Behavioral Health Resources in the Community: Intensive Outpatient Programs Organization         Address  Phone  Notes  [facility] [address] [place], [place] [phone]   [facility] Outpatient [address] [place], [place] [phone]   [facility] [address] [place], [place]  [phone]   [facility] [address]  [place], [place] [phone] or [phone]   Substance Abuse Resources Organization         Address  Phone  Notes  [facility]  [phone]   [facility]  [phone]   [facility]  [phone]   Floydene Flock  [phone]   Residential & Outpatient Substance Abuse Program  [phone]   Psychological Services Organization         Address  Phone  Notes  [facility]  [phone]   [facility]  [phone]   [facility]  Mental Health [address] [place] [phone] or [phone]    Mobile Crisis Teams Organization         Address  Phone  Notes  [facility], Mobile Crisis Care Unit  [phone]   [facility]  [address] [place], [place] [phone]   [facility] Incharon DeEsch [address] [place] [phone]    Self-Help/Support Groups Organization         Address  Phone             Notes  [facility] [place] - variety of support groups  336- [phone] Call for more information  Narcotics Anonymous (NA), [facility] [address], [place]  2 meetings at this location   Statisticianesidential Treatment Programs Organization         Address  Phone  Notes  [facility] [address],    [place]  [phone]   [facility]  [address], [place] [phone], [place], [place] [phone]   [facility] [address], [place] [phone] Admissions: 8am-3pm M-F  [facility] [address]    [place], [place] [phone]   [facility] [address], [place], [place] [phone]   [facility] [address]  [place], [place] [phone]   [facility] - Intensive Outpatient [address] Josephs[address], [place], [place] [phone]   [facility] ([facility] [address]  [place], [place] [phone] or [phone]   [facility] ([facility]) [address] [place], [place] [phone] Accepts Medicaid  [facility] [address]  [place] [phone] Substance Abuse/Addiction Treatment   [facility] Health Resources Organization         Address  Phone  Notes  [facility]  [phone](888) [phone]   Angie FavaJulie Brannon, PhD [address] [place], [place]   [phone](336) [phone] or [phone]) [phone]   [facility]   [address] [place], [place] [phone]) [phone]   [facility] [address], [place], [place] [phone](336) [phone] Insurance/Medicaid/sponsorship through [facility]  [facility] [address]                                    [place], [place] [phone](336) [phone] Therapy/tele-psych/case  [facility] [address]   [place], [place] [phone]) [phone]    Dr. Lolly MustacheArfeen  [phone]) [phone]   [facility] [place]  [employer] [facility] 1) [address], [place] 2) [address], [place] 3)  [address], [place] [phone]) [phone]  [phone]) [phone]   [facility] Child Abuse Hotline [phone](336) [phone] or [phone](336) [phone] (After Hours)

## 2015-01-05 NOTE — ED Notes (Addendum)
Pt states fever and body aches since Wednesday.  Has vomited x 3 since Tuesday.  Headache.  Generalized pain everywhere of 10/10.  No appetite. Denies cough.  No congestion.  Burning with urination.  Rt side back pain.  Fever in triage.  Home fever reported as high as 104.

## 2015-01-05 NOTE — ED Notes (Signed)
Pt sts that she is unable to provide urine at this time 

## 2015-01-05 NOTE — ED Provider Notes (Signed)
CSN: [id number]     Arrival date & time 01/05/15  0941 History   First MD Initiated Contact with Patient 01/05/15 1004     Chief Complaint  Patient presents with  . Fever  . Generalized Body Aches     (Consider location/radiation/quality/duration/timing/severity/associated sxs/prior Treatment) HPI  Erica Lang is a 39 y.o. female with PMH of cholecystectomy, pelvic laparoscopy, obesity, PCO S, hypertension, anemia presenting with 3-4 day history of fevers and generalized body aches with 3 episodes of emesis that is nonbloody as well as headache. Patient denies any focal pain but has had a headache that developed gradually and is unlike other "migraines" she has had before. Patient denies any shortness of breath or chest pain. She denies URI symptoms. She denies any diarrhea and last BM 2 days ago is nonbloody. She reports anorexia. Patient endorses burning with urination but no other urinary symptoms. No pelvic symptoms. Last menstrual period, days ago. Highest fever 104. Patient states she has not taken anything for her discomfort. States it developed gradually is getting worse. Patient denies sick contacts.   Past Medical History  Diagnosis Date  . Anemia   . Anxiety   . Abnormal Pap smear   . Hidradenitis   . Obesity   . PCOS (polycystic ovarian syndrome)   . Asthma   . Hypertension   . Postpartum hypertension 10/19/2013    no problem with BP since  . Gallstones    Past Surgical History  Procedure Laterality Date  . Vulva surgery    . Pelvic laparoscopy    . Cholecystectomy N/A 10/27/2014    Procedure: LAPAROSCOPIC CHOLECYSTECTOMY WITH INTRAOPERATIVE CHOLANGIOGRAM;  Surgeon: Atilano InaEric M Wilson, MD;  Location: [facility];  Service: General;  Laterality: N/A;   Family History  Problem Relation Age of Onset  . Stroke Mother   . Hypertension Mother   . Heart disease Mother   . Deep vein thrombosis Mother     both legs, arms and back, 2 blood clots migrted to her chest area  .  Diabetes Mother    History  Substance Use Topics  . Smoking status: Never Smoker   . Smokeless tobacco: Never Used  . Alcohol Use: No   OB History    Gravida Para Term Preterm AB TAB SAB Ectopic Multiple Living   3 2 2  1  1   2      Review of Systems 10 Systems reviewed and are negative for acute change except as noted in the HPI.    Allergies  Bee venom  Home Medications   Prior to Admission medications   Medication Sig Start Date End Date Taking? Authorizing Provider  acetaminophen (TYLENOL) 500 MG tablet Take 1,000 mg by mouth every 6 (six) hours as needed for headache.   Yes Historical Provider, MD  albuterol (PROVENTIL HFA;VENTOLIN HFA) 108 (90 BASE) MCG/ACT inhaler Inhale 2 puffs into the lungs every 6 (six) hours as needed for wheezing. 12/08/12  Yes Thao P Le, DO  EPINEPHrine (EPI-PEN) 0.3 mg/0.3 mL DEVI Inject 0.3 mLs (0.3 mg total) into the muscle once. 04/28/12  Yes Rickard PatienceSarah E Nelson, PA-C  Multiple Vitamins-Minerals (MULTIVITAMIN WITH MINERALS) tablet Take 1 tablet by mouth daily.   Yes Historical Provider, MD  Multiple Vitamins-Minerals (ZINC PO) Take 1 tablet by mouth daily.   Yes Historical Provider, MD  cefpodoxime (VANTIN) 100 MG tablet Take 1 tablet (100 mg total) by mouth 2 (two) times daily. 01/05/15   Louann SjogrenVictoria L Nereida Schepp, PA-C  HYDROcodone-acetaminophen (  NORCO/VICODIN) 5-325 MG per tablet Take 1 tablet by mouth every 4 (four) hours as needed for moderate pain or severe pain. 01/05/15   Louann Sjogren, PA-C  oxyCODONE (OXY IR/ROXICODONE) 5 MG immediate release tablet Take 1-2 tablets (5-10 mg total) by mouth every 4 (four) hours as needed. Patient not taking: Reported on 01/05/2015 10/27/14   Atilano Ina, MD   BP 128/72 mmHg  Pulse 84  Temp(Src) 98.7 F (37.1 C) (Oral)  Resp 17  SpO2 99%  LMP 01/03/2015 Physical Exam  Constitutional: She appears well-developed and well-nourished. No distress.  HENT:  Head: Normocephalic and atraumatic.  Mouth/Throat:  Oropharynx is clear and moist.  Eyes: Conjunctivae and EOM are normal. Pupils are equal, round, and reactive to light. Right eye exhibits no discharge. Left eye exhibits no discharge.  Cardiovascular: Normal rate, regular rhythm and normal heart sounds.   Pulmonary/Chest: Effort normal and breath sounds normal. No respiratory distress. She has no wheezes.  Abdominal: Soft. Bowel sounds are normal. She exhibits no distension.  Diffuse abdominal tenderness without rebound, rigidity, guarding. No CVA tenderness.  Musculoskeletal:  Bilateral low back tenderness. No midline tenderness or crepitus.   Neurological: She is alert. She exhibits normal muscle tone. Coordination normal.  No facial droop or ataxia.   Skin: Skin is warm and dry. She is not diaphoretic.  Nursing note and vitals reviewed.   ED Course  Procedures (including critical care time) Labs Review Labs Reviewed  CBC WITH DIFFERENTIAL - Abnormal; Notable for the following:    RBC 3.78 (*)    Hemoglobin 11.2 (*)    HCT 34.9 (*)    Monocytes Relative 13 (*)    Monocytes Absolute 1.2 (*)    All other components within normal limits  COMPREHENSIVE METABOLIC PANEL - Abnormal; Notable for the following:    Potassium 3.4 (*)    All other components within normal limits  URINALYSIS, ROUTINE W REFLEX MICROSCOPIC - Abnormal; Notable for the following:    APPearance HAZY (*)    Hgb urine dipstick MODERATE (*)    Ketones, ur 40 (*)    Protein, ur 30 (*)    Nitrite POSITIVE (*)    Leukocytes, UA LARGE (*)    All other components within normal limits  URINE MICROSCOPIC-ADD ON - Abnormal; Notable for the following:    Squamous Epithelial / LPF MANY (*)    Bacteria, UA MANY (*)    All other components within normal limits  URINE CULTURE  I-STAT CG4 LACTIC ACID, ED  POC URINE PREG, ED    Imaging Review Dg Chest 2 View  01/05/2015   CLINICAL DATA:  Nausea and vomiting and fever  EXAM: CHEST  2 VIEW  COMPARISON:  07/20/2009   FINDINGS: The heart size and mediastinal contours are within normal limits. Both lungs are clear. The visualized skeletal structures are unremarkable.  IMPRESSION: No active cardiopulmonary disease.   Electronically Signed   By: Alcide Clever M.D.   On: 01/05/2015 10:47   Dg Abd 2 Views  01/05/2015   CLINICAL DATA:  Nausea and vomiting  EXAM: ABDOMEN - 2 VIEW  COMPARISON:  07/20/2009  FINDINGS: Scattered large and small bowel gas is noted. No free air is seen. No abnormal mass or abnormal calcifications are seen. No bony abnormality is noted.  IMPRESSION: No acute abnormality noted.   Electronically Signed   By: Alcide Clever M.D.   On: 01/05/2015 10:48     EKG Interpretation None  MDM   Final diagnoses:  Cough  Emesis  Pyelonephritis   Patient presenting with fever, nausea, vomiting, low back pain as well as urinary symptoms. Patient with fever and mild tachycardia that resolved in the ED. Urine infected suggesting pyelonephritis. Patient with blood in her urine however she is currently menstruating. Patient tolerating by mouth in the emergency room. Patient also without creatinine or  BUN abnormalities. Patient with mild anemia but no electrolyte abnormalities. Chest and abdominal x-rays unremarkable. Patient to take Vantin for 10 days. Patient without her primary care provider and she's been given ED resources to establish care and follow-up. Patient is reliable for follow-up.  Discussed return precautions with patient. Discussed all results and patient verbalizes understanding and agrees with plan.  This is a shared patient. This patient was discussed with the physician, Dr. Littie Deeds who saw and evaluated the patient and agrees with the plan.    Louann Sjogren, PA-C 01/05/15 1411  Mirian Mo, MD 01/06/15 [phone]

## 2015-01-07 LAB — URINE CULTURE: Colony Count: >=100000

## 2015-01-08 ENCOUNTER — Telehealth (HOSPITAL_COMMUNITY): Payer: Self-pay

## 2015-01-08 NOTE — ED Notes (Signed)
Post ED Visit - Positive Culture Follow-up  Culture report reviewed by antimicrobial stewardship pharmacist: []  Wes Dulaney, Pharm.D., BCPS []  Celedonio MiyamotoJeremy Frens, [address]Ph[address] BCPS [x]  Georgina PillionElizabeth Martin, Pharm.D., BCPS []  ChesapeakeMinh Pham, [place]arm[place]D., BCPS, AAHIVP []  Estella HuskMichelle Turner, Pharm.D., BCPS, AAHIVP []  Elder CyphersLorie Poole, [address]Pharm[address]D., BCPS  Positive  Urine culture Treated with cefpodoxime, organism sensitive to the same and no further patient follow-up is required at this time.  Ashley JacobsFesterman, Ketura Sirek C 01/08/2015, 11:25 AM

## 2015-04-20 IMAGING — RF DG CHOLANGIOGRAM OPERATIVE
1 series · 4 of 4 positions shown · non-contrast
Comparison: None.

CLINICAL DATA: 38-year-old female with intraoperative cholangiogram

EXAM:
INTRAOPERATIVE CHOLANGIOGRAM

[Series 1: run · 4 of 48 frames shown]
[frame 1/48]
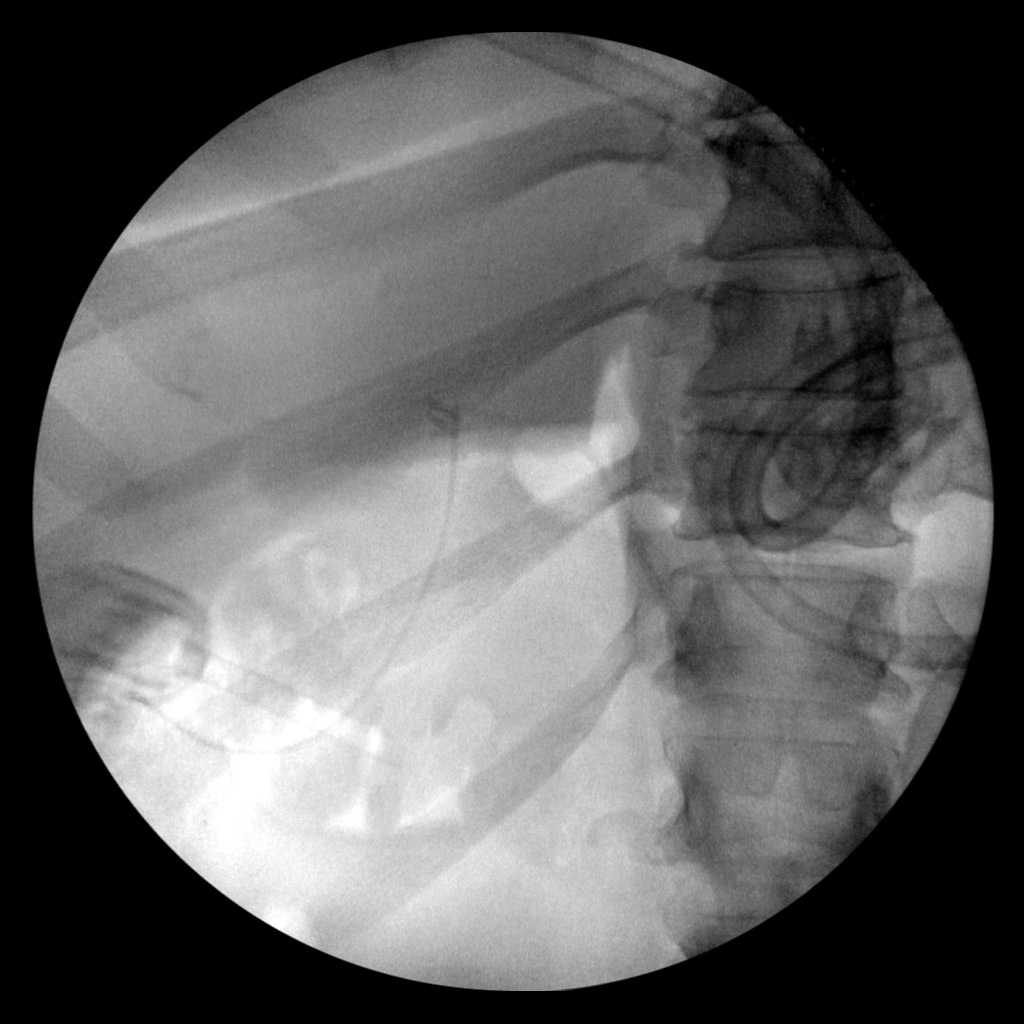
[frame 8/48]
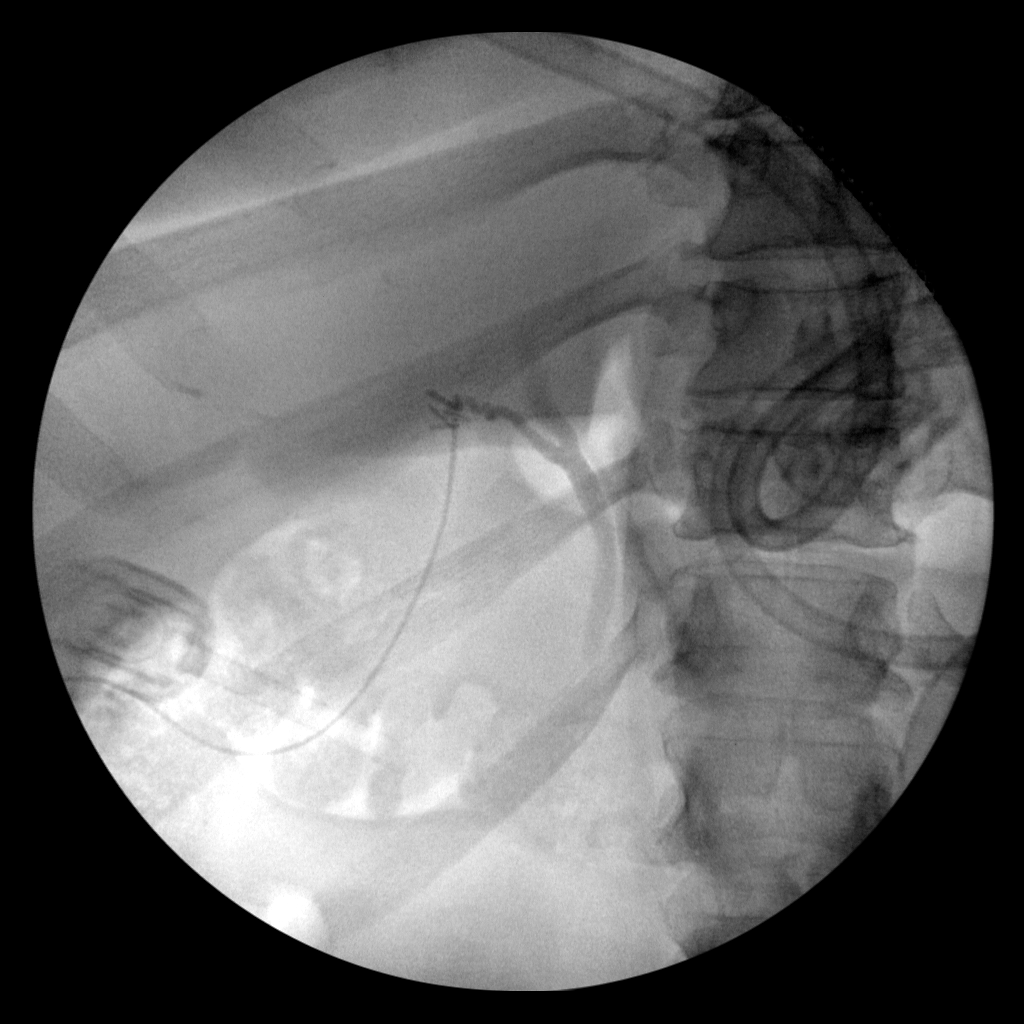
[frame 25/48]
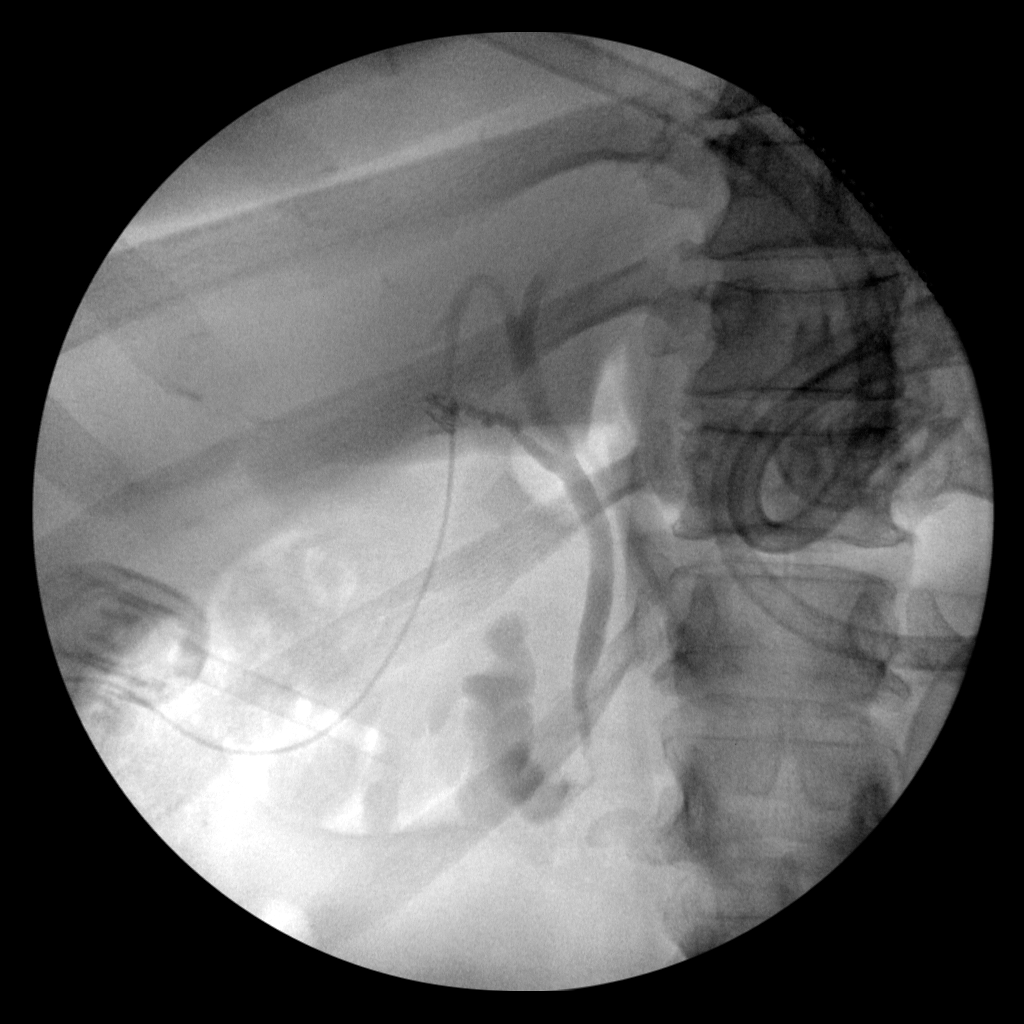
[frame 41/48]
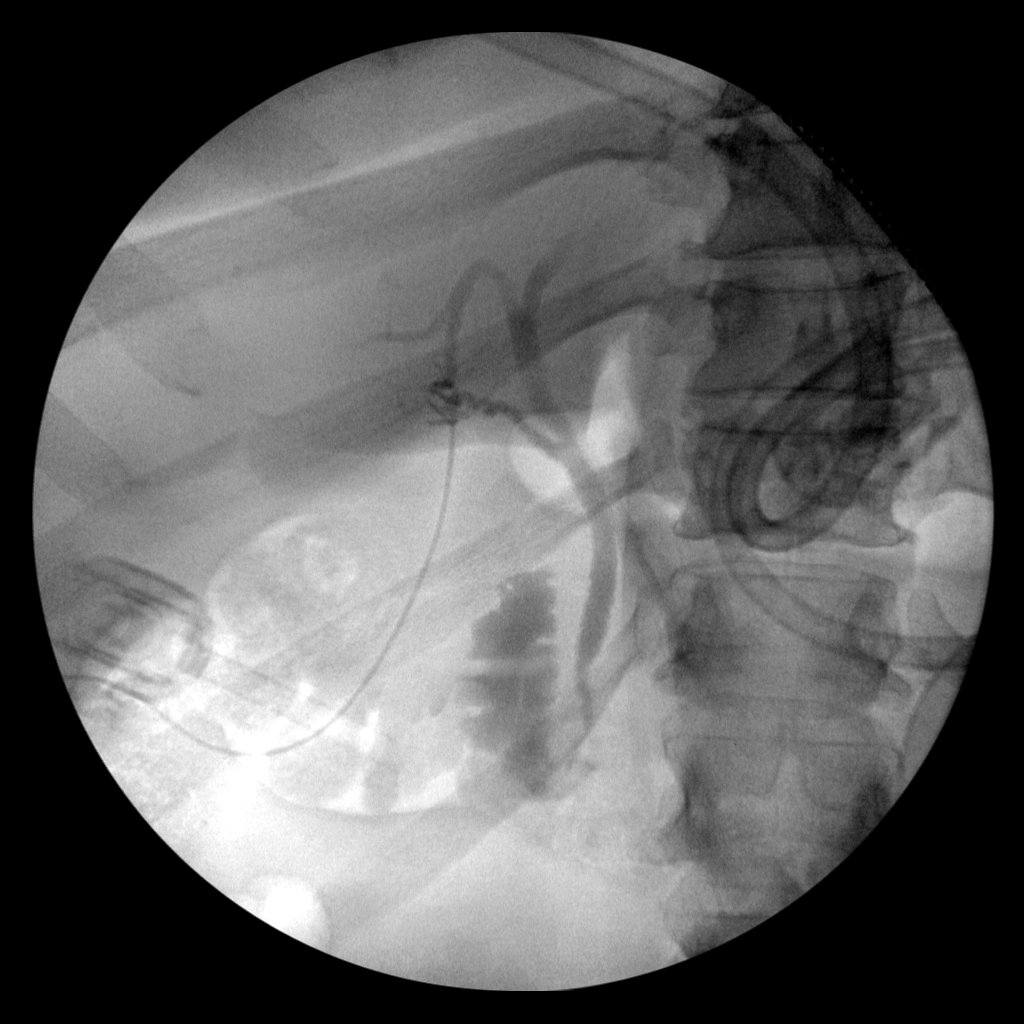

[4 of 4 positions shown; findings below may reference images not displayed]

FINDINGS: Limited intraoperative cholangiogram.

Cannulation of the cystic duct with surgical clips compatible with
prior cholecystectomy.

Partial filling of the cystic duct, common hepatic duct, common bile
duct. Contrast traverses the ampulla into the duodenum.

Partial filling of left and right hepatic ducts.

No extraluminal contrast.

No filling defects within the ductal system.
IMPRESSION: Intraoperative cholangiogram demonstrates no filling defect of the
common hepatic duct, cystic duct, common bile duct with contrast
traversing the ampulla into the duodenum. No extraluminal contrast.

Please refer to the dictated operative report for full details of
intraoperative findings and procedure.

## 2015-05-17 ENCOUNTER — Other Ambulatory Visit: Payer: Self-pay | Admitting: Family Medicine

## 2015-05-17 DIAGNOSIS — R229 Localized swelling, mass and lump, unspecified: Principal | ICD-10-CM

## 2015-05-17 DIAGNOSIS — IMO0002 Reserved for concepts with insufficient information to code with codable children: Secondary | ICD-10-CM

## 2015-05-18 ENCOUNTER — Ambulatory Visit
Admission: RE | Admit: 2015-05-18 | Discharge: 2015-05-18 | Disposition: A | Discharge status: Home / Self Care | Payer: 59 | Source: Ambulatory Visit | Attending: Family Medicine | Admitting: Family Medicine

## 2015-05-18 DIAGNOSIS — IMO0002 Reserved for concepts with insufficient information to code with codable children: Secondary | ICD-10-CM

## 2015-05-18 DIAGNOSIS — R229 Localized swelling, mass and lump, unspecified: Principal | ICD-10-CM

## 2015-06-07 ENCOUNTER — Ambulatory Visit (INDEPENDENT_AMBULATORY_CARE_PROVIDER_SITE_OTHER): Payer: 59 | Admitting: Neurology

## 2015-06-07 ENCOUNTER — Encounter: Payer: Self-pay | Admitting: Neurology

## 2015-06-07 VITALS — BP 132/70 | HR 96 | Resp 16 | Ht 69.0 in | Wt 243.0 lb

## 2015-06-07 DIAGNOSIS — G5602 Carpal tunnel syndrome, left upper limb: Secondary | ICD-10-CM

## 2015-06-07 DIAGNOSIS — R208 Other disturbances of skin sensation: Secondary | ICD-10-CM

## 2015-06-07 DIAGNOSIS — G5601 Carpal tunnel syndrome, right upper limb: Secondary | ICD-10-CM | POA: Diagnosis not present

## 2015-06-07 DIAGNOSIS — R209 Unspecified disturbances of skin sensation: Secondary | ICD-10-CM

## 2015-06-07 DIAGNOSIS — G5603 Carpal tunnel syndrome, bilateral upper limbs: Secondary | ICD-10-CM

## 2015-06-07 NOTE — Progress Notes (Signed)
Erica Lang was seen today in neurologic consultation.  The patient was seen in consultation at the request of Dr. Cherly Hensen. The consultation is for the evaluation of headache.   The patient is a 39 y.o. year old female with a history of headache.  The patient reports that she had headaches years ago but they suddenly went away.  They came back just over a year ago. She had headaches the entire way through the pregnancy.   She is currently one month post partum and states that headaches are severe. She states that the headache now is the same she has always had.   She was seen post partum at the hospital.  She was placed on BP medication.  She had an MRI brain and C-spine, both of which were unremarkable.  She was evaluated by Dr. Amada Jupiter in the hospital.  The headaches are at the vertex and radiate posteriorally or they are in the frontal region.  They are pressure like and squeezing when on the top of the head and more piercing in the frontal region.  She has some nausea with the headaches and may/may not have emesis.  She has photophobia and phonophobia.  She has had a headache every day this week and they are often lasting all day.  She doesn't associate the headache with changes in position or valsalva maneuver.  She has some shooting lights in the visual field with the headache.  She gained about 50 lbs with the pregnancy and she has taken off about 25 pds of that.  She is currently taking compazine for headache and she is taking it 2-3 times per day.  It is not helping.  She has never been on a preventative medication for headache.    06/07/15 update:  The patient follows up today for a different reason.  I have not seen her since November, 2014, at which time I was seeing her for headache.  She is now being referred for hand paresthesias, which apparently has been going on for 3-4 months, but it has been increasing with time.  The patient states that she has painful paresthesias in both hands when  braiding her dau.ghters hair.  All the fingers are involved.  She notices it when cooking as well.  It will awaken her from sleep as well.  She is losing grasp on the hands and noted that she even dropped her son.  She has pain over the volar aspect of the wrist and has trouble opening jars/jugs.  No similar sensation in the feet.  No pain in the neck.    PREVIOUS MEDICATIONS: Not applicable  ALLERGIES:   Allergies  Allergen Reactions  . Bee Venom Anaphylaxis    CURRENT MEDICATIONS:  Current Outpatient Prescriptions on File Prior to Visit  Medication Sig Dispense Refill  . acetaminophen (TYLENOL) 500 MG tablet Take 1,000 mg by mouth every 6 (six) hours as needed for headache.    . albuterol (PROVENTIL HFA;VENTOLIN HFA) 108 (90 BASE) MCG/ACT inhaler Inhale 2 puffs into the lungs every 6 (six) hours as needed for wheezing. 1 Inhaler 0  . Multiple Vitamins-Minerals (MULTIVITAMIN WITH MINERALS) tablet Take 1 tablet by mouth daily.    Marland Kitchen EPINEPHrine (EPI-PEN) 0.3 mg/0.3 mL DEVI Inject 0.3 mLs (0.3 mg total) into the muscle once. (Patient not taking: Reported on 06/07/2015) 1 Device 0   No current facility-administered medications on file prior to visit.    PAST MEDICAL HISTORY:   Past Medical History  Diagnosis Date  .  Anemia   . Anxiety   . Abnormal Pap smear   . Hidradenitis   . Obesity   . PCOS (polycystic ovarian syndrome)   . Asthma   . Hypertension   . Postpartum hypertension 10/19/2013    no problem with BP since  . Gallstones     PAST SURGICAL HISTORY:   Past Surgical History  Procedure Laterality Date  . Vulva surgery    . Pelvic laparoscopy    . Cholecystectomy N/A 10/27/2014    Procedure: LAPAROSCOPIC CHOLECYSTECTOMY WITH INTRAOPERATIVE CHOLANGIOGRAM;  Surgeon: Atilano Ina, MD;  Location: [facility];  Service: General;  Laterality: N/A;    SOCIAL HISTORY:   History   Social History  . Marital Status: Married    Spouse Name: N/A  . Number of Children: 2  . Years  of Education: N/A   Occupational History  .      [employer]   Social History Main Topics  . Smoking status: Never Smoker   . Smokeless tobacco: Never Used  . Alcohol Use: 0.0 oz/week    0 Standard drinks or equivalent per week     Comment: occ  . Drug Use: No  . Sexual Activity: Yes   Other Topics Concern  . Not on file   Social History Narrative    FAMILY HISTORY:   Family Status  Relation Status Death Age  . Mother Alive 12    stroke with L paralysis (3 strokes)  . Father      unknown med health  . Brother Alive     2, healthy  . Brother Deceased     1, birth (only lived 3 weeks)  . Sister Alive     5, healthy  . Sister Deceased     1, murder  . Child Alive     2, alive and well    ROS:  A complete 10 system review of systems was obtained and was unremarkable apart from what is mentioned above.  PHYSICAL EXAMINATION:    VITALS:   Filed Vitals:   06/07/15 0937  BP: 132/70  Pulse: 96  Resp: 16  Height:  (1.753 m)  Weight: 243 lb (110.224 kg)  SpO2: 98%   Wt Readings from Last 3 Encounters:  06/07/15 243 lb (110.224 kg)  10/27/14 238 lb (107.956 kg)  10/25/14 238 lb (107.956 kg)    GEN:  Appears stated age and in NAD. HEENT:  Normocephalic, atraumatic. The mucous membranes are moist. The superficial temporal arteries are without ropiness or tenderness. Cardiovascular: Regular rate and rhythm. Lungs: Clear to auscultation bilaterally. Neck: There are no carotid bruits noted bilaterally.  NEUROLOGICAL: Orientation:  Pt is alert and oriented x 3.  Fund of knowledge is appropriate.  Recent and remote memory intact.  Attention and concentration normal.  Able to repeat and name. Cranial nerves: There is good facial symmetry. The pupils are equal round and reactive to light bilaterally. Funduscopic exam reveals clear disc margins bilaterally. Extraocular muscles are intact and visual fields are full to confrontational testing. Speech is  fluent and clear. Soft palate rises symmetrically and there is no tongue deviation. Hearing is intact to conversational tone. Tone: Tone is good throughout. Sensation: Sensation is intact to light touch and pinprick throughout (facial, extremities, trunk).  She reports decreased pinprick in all fingers, but it is symmetric bilaterally.  Vibration is intact at the bilateral big toe. There is no extinction with double simultaneous stimulation. There is no sensory dermatomal  level identified. Coordination:  The patient has no difficulty with RAM's or FNF bilaterally. Motor: There is diffuse give way weakness that improves with encouragement.  Give way weakness is especially prominent in the hands/when doing manual muscle testing of the intrinsic musculature of the hands.  Again, this improves with encouragement.  Strength throughout is at least 5-/5.  There is no wasting of the intrinsic musculature of the hands.  Shoulder shrug is equal and symmetric.  There is no pronator drift.  There are no fasciculations noted. DTR's: Deep tendon reflexes are 2/4 at the bilateral biceps, triceps, brachioradialis, patella and achilles.  Plantar responses are downgoing bilaterally. Gait and Station: The patient is able to ambulate without difficulty. The patient is able to heel toe walk without any difficulty. The patient is able to ambulate in a tandem fashion. The patient is able to stand in the Romberg position.   IMPRESSIONS/PLAN:  1.  Hand paresthesias  -I suspect that this could represent carpal tunnel syndrome/median neuropathy at the wrist.  We will proceed with an EMG.  She was hoping that we could get this done today, but I explained that this is a lengthy study and she was not scheduled for that type of study today.   She asks me what she can do in the meantime to help her discomfort.  I gave her a prescription for a cockup wrist splint to wear at least on a nightly basis bilaterally.  We also talked about  proper, safe use of non-steroidal anti-inflammatory such as Advil.  She asked me for a work note. I told her that I thought that she could still work but gave her a note to be able to wear her wrist splint at work.  Hopefully, we can get her in for testing soon and will call her with the results.  She was agreeable. 2.  Total face to face time was 30 min.  Much greater than 50% of this visit was spent in counseling and coordinating care.

## 2015-06-07 NOTE — Progress Notes (Signed)
Note sent

## 2015-06-11 ENCOUNTER — Telehealth: Payer: Self-pay | Admitting: *Deleted

## 2015-06-11 NOTE — Telephone Encounter (Signed)
Patient was returning a call to Darl Pikes to schedule her EMG. Darl Pikes- please call patient back at number below. Thanks.

## 2015-06-11 NOTE — Telephone Encounter (Signed)
Patient call in reference to setting up an appointment Call back number [phone]

## 2015-06-11 NOTE — Telephone Encounter (Signed)
This is Dr Don Perking patient  Message and call forwarded to [facility]

## 2015-06-13 ENCOUNTER — Telehealth: Payer: Self-pay | Admitting: Neurology

## 2015-06-13 ENCOUNTER — Ambulatory Visit (INDEPENDENT_AMBULATORY_CARE_PROVIDER_SITE_OTHER): Payer: 59 | Admitting: Neurology

## 2015-06-13 ENCOUNTER — Encounter: Payer: Self-pay | Admitting: *Deleted

## 2015-06-13 DIAGNOSIS — G5602 Carpal tunnel syndrome, left upper limb: Secondary | ICD-10-CM

## 2015-06-13 DIAGNOSIS — G5603 Carpal tunnel syndrome, bilateral upper limbs: Secondary | ICD-10-CM

## 2015-06-13 DIAGNOSIS — G5601 Carpal tunnel syndrome, right upper limb: Secondary | ICD-10-CM | POA: Diagnosis not present

## 2015-06-13 DIAGNOSIS — M5412 Radiculopathy, cervical region: Secondary | ICD-10-CM

## 2015-06-13 NOTE — Procedures (Signed)
[facility]  [address] [place], [address]  [place], [place] [address] Tel: [phone] Fax:  [phone] Test Date:  06/13/2015  Patient: Erica Lang DOB: 09-19-1976 Physician: Nita Sickle, DO  Sex: Female Height:  Ref Phys: Kerin Salen  ID#: [id number] Temp: 35.1C Technician:    Patient Complaints: This is a 39 year-old female presenting for evaluation of bilateral hand paresthesias.  NCV & EMG Findings: Extensive electrodiagnostic testing of the right upper extremity and additional studies of the left shows:  1. The left median sensory nerve showed prolonged distal peak latency (4.5 ms).  The right median sensory nerve showed prolonged distal peak latency (4.8 ms) and reduced amplitude (9.4 V). Bilateral ulnar sensory responses are normal. 2. Bilateral median motor nerves showed prolonged distal onset latency (L4.5, R5.2 ms).  Additionally, the left median motor response shows increased amplitude proximally and there is a motor response when stimulated at the ulnar wrist, showing anomalous innervation to the abductor pollicis brevis muscle consistent with a Martin-Gruber anastomosis, a normal variant.  Bilateral ulnar motor responses are within normal limits.  3. Chronic motor axon loss changes are seen affecting the left abductor pollicis brevis muscle and the right pronator teres and biceps muscles. There is no evidence of accompanied active denervation.  Impression: 1. Bilateral median neuropathy at or distal to the wrist, consistent with clinical diagnosis of carpal tunnel syndrome. Overall these findings are moderate in degree electrically, and worse on the right. 2. Chronic C6 radiculopathy affecting the left upper extremity, mild in degree electrically. 3. Left Martin-Gruber anastomosis, a normal variant.   ___________________________ Nita Sickle, DO    Nerve Conduction Studies Anti Sensory Summary Table   Site NR Peak (ms) Norm Peak (ms) P-T Amp (V)  Norm P-T Amp  Left Median Anti Sensory (2nd Digit)  Wrist    4.5 <3.4 27.2 >20  Right Median Anti Sensory (2nd Digit)  Wrist    4.8 <3.4 9.4 >20  Left Ulnar Anti Sensory (5th Digit)  Wrist    2.9 <3.1 44.3 >12  Right Ulnar Anti Sensory (5th Digit)  Wrist    2.7 <3.1 43.4 >12   Motor Summary Table   Site NR Onset (ms) Norm Onset (ms) O-P Amp (mV) Norm O-P Amp Site1 Site2 Delta-0 (ms) Dist (cm) Vel (m/s) Norm Vel (m/s)  Left Median Motor (Abd Poll Brev)  Wrist    4.5 <3.9 8.1 >6 Elbow Wrist 5.1 30.0 59 >50  Elbow    9.6  8.7  Ulnar-wrist crossover Elbow 5.7 0.0    Ulnar-wrist crossover    3.9  3.6         Right Median Motor (Abd Poll Brev)  Wrist    5.2 <3.9 8.6 >6 Elbow Wrist 5.3 29.0 55 >50  Elbow    10.5  8.5         Left Ulnar Motor (Abd Dig Minimi)  Wrist    2.2 <3.1 7.5 >7 B Elbow Wrist 4.2 25.0 60 >50  B Elbow    6.4  7.5  A Elbow B Elbow 1.7 10.0 59 >50  A Elbow    8.1  7.4         Right Ulnar Motor (Abd Dig Minimi)  Wrist    2.1 <3.1 8.6 >7 B Elbow Wrist 4.2 26.0 62 >50  B Elbow    6.3  7.6  A Elbow B Elbow 1.6 10.0 62 >50  A Elbow    7.9  7.4  EMG   Side Muscle Ins Act Fibs Psw Fasc Number Recrt Dur Dur. Amp Amp. Poly Poly. Comment  Right 1stDorInt Nml Nml Nml Nml Nml Nml Nml Nml Nml Nml Nml Nml N/A  Right Abd Poll Brev Nml Nml Nml Nml 1- Mod-R Few 1+ Few 1+ Nml Nml N/A  Right Ext Indicis Nml Nml Nml Nml Nml Nml Nml Nml Nml Nml Nml Nml N/A  Right PronatorTeres Nml Nml Nml Nml Nml Nml Nml Nml Nml Nml Nml Nml N/A  Right Biceps Nml Nml Nml Nml Nml Nml Nml Nml Nml Nml Nml Nml N/A  Right Triceps Nml Nml Nml Nml Nml Nml Nml Nml Nml Nml Nml Nml N/A  Right Deltoid Nml Nml Nml Nml Nml Nml Nml Nml Nml Nml Nml Nml N/A  Left 1stDorInt Nml Nml Nml Nml Nml Nml Nml Nml Nml Nml Nml Nml N/A  Left Abd Poll Brev Nml Nml Nml Nml Nml Nml Nml Nml Nml Nml Nml Nml N/A  Left PronatorTeres Nml Nml Nml Nml 1- Mod-R Few 1+ Few 1+ Nml Nml N/A  Left Biceps Nml Nml Nml Nml 1- Mod-R Few 1+  Few 1+ Nml Nml N/A  Left Triceps Nml Nml Nml Nml Nml Nml Nml Nml Nml Nml Nml Nml N/A  Left Deltoid Nml Nml Nml Nml Nml Nml Nml Nml Nml Nml Nml Nml N/A      Waveforms:

## 2015-06-13 NOTE — Telephone Encounter (Signed)
Referral placed to Dr Amanda Pea per Dr Tat. [facility] and they stated to fax all records and they will contact patient with appt. Referral and records faxed to [phone] attn [place] with confirmation received.

## 2015-06-13 NOTE — Telephone Encounter (Signed)
-----   Message from Octaviano Batty Tat, DO sent at 06/13/2015 11:25 AM EDT ----- Let pt know that EMG confirmed she does have bilateral CTS, right a little worse than the left.  Does she want referral to surgeon?

## 2015-06-13 NOTE — Telephone Encounter (Signed)
Patient aware of EMG results. Requests referral to surgeon.

## 2015-07-30 ENCOUNTER — Encounter (HOSPITAL_BASED_OUTPATIENT_CLINIC_OR_DEPARTMENT_OTHER): Payer: Self-pay | Admitting: *Deleted

## 2015-07-30 NOTE — Progress Notes (Addendum)
NPO AFTER MN WITH EXCEPTION LIQUIDS UNTIL 0700 (NO CREAM/ MILK PRODUCTS).  ARRIVE AT 1130. NEEDS HG AND URINE PREG.  PT STATES SHOULD BE RIGHT CARPAL TUNNEL RELEASE NOT LEFT, GAVE HER OR SCHEDULER PHONE NUMBER TO CALL AND VERIFY THIS.

## 2015-08-02 ENCOUNTER — Ambulatory Visit (HOSPITAL_BASED_OUTPATIENT_CLINIC_OR_DEPARTMENT_OTHER)
Admission: RE | Admit: 2015-08-02 | Discharge: 2015-08-02 | Disposition: A | Discharge status: Home / Self Care | Payer: 59 | Source: Ambulatory Visit | Attending: Orthopedic Surgery | Admitting: Orthopedic Surgery

## 2015-08-02 ENCOUNTER — Encounter (HOSPITAL_BASED_OUTPATIENT_CLINIC_OR_DEPARTMENT_OTHER)
Admission: RE | Disposition: A | Discharge status: Home / Self Care | Payer: Self-pay | Source: Ambulatory Visit | Attending: Orthopedic Surgery

## 2015-08-02 ENCOUNTER — Ambulatory Visit (HOSPITAL_BASED_OUTPATIENT_CLINIC_OR_DEPARTMENT_OTHER): Payer: 59 | Admitting: Anesthesiology

## 2015-08-02 ENCOUNTER — Encounter (HOSPITAL_BASED_OUTPATIENT_CLINIC_OR_DEPARTMENT_OTHER): Payer: Self-pay | Admitting: *Deleted

## 2015-08-02 DIAGNOSIS — E669 Obesity, unspecified: Secondary | ICD-10-CM | POA: Insufficient documentation

## 2015-08-02 DIAGNOSIS — R51 Headache: Secondary | ICD-10-CM | POA: Insufficient documentation

## 2015-08-02 DIAGNOSIS — I10 Essential (primary) hypertension: Secondary | ICD-10-CM | POA: Insufficient documentation

## 2015-08-02 DIAGNOSIS — Z6835 Body mass index (BMI) 35.0-35.9, adult: Secondary | ICD-10-CM | POA: Insufficient documentation

## 2015-08-02 DIAGNOSIS — D649 Anemia, unspecified: Secondary | ICD-10-CM | POA: Insufficient documentation

## 2015-08-02 DIAGNOSIS — Z91030 Bee allergy status: Secondary | ICD-10-CM | POA: Insufficient documentation

## 2015-08-02 DIAGNOSIS — Z79899 Other long term (current) drug therapy: Secondary | ICD-10-CM | POA: Insufficient documentation

## 2015-08-02 DIAGNOSIS — E282 Polycystic ovarian syndrome: Secondary | ICD-10-CM | POA: Insufficient documentation

## 2015-08-02 DIAGNOSIS — G5601 Carpal tunnel syndrome, right upper limb: Secondary | ICD-10-CM

## 2015-08-02 DIAGNOSIS — F419 Anxiety disorder, unspecified: Secondary | ICD-10-CM | POA: Insufficient documentation

## 2015-08-02 DIAGNOSIS — J45909 Unspecified asthma, uncomplicated: Secondary | ICD-10-CM | POA: Insufficient documentation

## 2015-08-02 DIAGNOSIS — K589 Irritable bowel syndrome without diarrhea: Secondary | ICD-10-CM | POA: Insufficient documentation

## 2015-08-02 HISTORY — DX: Personal history of other medical treatment: Z92.89

## 2015-08-02 HISTORY — DX: Carpal tunnel syndrome, bilateral upper limbs: G56.03

## 2015-08-02 HISTORY — PX: CARPAL TUNNEL RELEASE: SHX101

## 2015-08-02 HISTORY — DX: Irritable bowel syndrome, unspecified: K58.9

## 2015-08-02 HISTORY — DX: Personal history of other complications of pregnancy, childbirth and the puerperium: Z87.59

## 2015-08-02 HISTORY — DX: Personal history of other diseases of the female genital tract: Z87.42

## 2015-08-02 HISTORY — DX: Unspecified asthma, uncomplicated: J45.909

## 2015-08-02 LAB — POCT PREGNANCY, URINE: Preg Test, Ur: NEGATIVE

## 2015-08-02 SURGERY — CARPAL TUNNEL RELEASE
Anesthesia: Monitor Anesthesia Care | Laterality: Right

## 2015-08-02 MED ORDER — OXYCODONE-ACETAMINOPHEN 5-325 MG PO TABS
1.0000 | ORAL_TABLET | ORAL | Status: DC | PRN
  Administered 2015-08-02: 1 via ORAL
  Filled 2015-08-02: qty 1

## 2015-08-02 MED ORDER — LIDOCAINE HCL (CARDIAC) 20 MG/ML IV SOLN
INTRAVENOUS | Status: DC | PRN
  Administered 2015-08-02: 80 mg via INTRAVENOUS

## 2015-08-02 MED ORDER — OXYCODONE-ACETAMINOPHEN 5-325 MG PO TABS
ORAL_TABLET | ORAL | Status: AC
  Filled 2015-08-02: qty 1

## 2015-08-02 MED ORDER — FENTANYL CITRATE (PF) 100 MCG/2ML IJ SOLN
INTRAMUSCULAR | Status: AC
  Filled 2015-08-02: qty 2

## 2015-08-02 MED ORDER — FENTANYL CITRATE (PF) 100 MCG/2ML IJ SOLN
INTRAMUSCULAR | Status: DC | PRN
  Administered 2015-08-02 (×2): 50 ug via INTRAVENOUS

## 2015-08-02 MED ORDER — PROPOFOL 500 MG/50ML IV EMUL
INTRAVENOUS | Status: DC | PRN
  Administered 2015-08-02: 160 ug/kg/min via INTRAVENOUS

## 2015-08-02 MED ORDER — CEFAZOLIN SODIUM-DEXTROSE 2-3 GM-% IV SOLR
INTRAVENOUS | Status: AC
  Filled 2015-08-02: qty 50

## 2015-08-02 MED ORDER — 0.9 % SODIUM CHLORIDE (POUR BTL) OPTIME
TOPICAL | Status: DC | PRN
  Administered 2015-08-02: 500 mL

## 2015-08-02 MED ORDER — ONDANSETRON HCL 4 MG/2ML IJ SOLN
4.0000 mg | Freq: Once | INTRAMUSCULAR | Status: DC | PRN
  Filled 2015-08-02: qty 2

## 2015-08-02 MED ORDER — CHLORHEXIDINE GLUCONATE 4 % EX LIQD
60.0000 mL | Freq: Once | CUTANEOUS | Status: DC
  Filled 2015-08-02: qty 60

## 2015-08-02 MED ORDER — OXYCODONE-ACETAMINOPHEN 5-325 MG PO TABS: 1.0000 | ORAL_TABLET | ORAL | Status: DC | PRN

## 2015-08-02 MED ORDER — LIDOCAINE HCL (PF) 1 % IJ SOLN
INTRAMUSCULAR | Status: DC | PRN
  Administered 2015-08-02: 10 mL

## 2015-08-02 MED ORDER — FENTANYL CITRATE (PF) 100 MCG/2ML IJ SOLN
INTRAMUSCULAR | Status: AC
  Filled 2015-08-02: qty 6

## 2015-08-02 MED ORDER — FENTANYL CITRATE (PF) 100 MCG/2ML IJ SOLN
25.0000 ug | INTRAMUSCULAR | Status: DC | PRN
  Administered 2015-08-02 (×2): 25 ug via INTRAVENOUS
  Filled 2015-08-02: qty 1

## 2015-08-02 MED ORDER — LACTATED RINGERS IV SOLN
INTRAVENOUS | Status: DC
  Administered 2015-08-02: 12:00:00 via INTRAVENOUS
  Filled 2015-08-02: qty 1000

## 2015-08-02 MED ORDER — MIDAZOLAM HCL 2 MG/2ML IJ SOLN
INTRAMUSCULAR | Status: AC
  Filled 2015-08-02: qty 2

## 2015-08-02 MED ORDER — KETOROLAC TROMETHAMINE 30 MG/ML IJ SOLN
INTRAMUSCULAR | Status: AC
  Filled 2015-08-02: qty 1

## 2015-08-02 MED ORDER — MIDAZOLAM HCL 5 MG/5ML IJ SOLN
INTRAMUSCULAR | Status: DC | PRN
  Administered 2015-08-02: 2 mg via INTRAVENOUS

## 2015-08-02 MED ORDER — DEXAMETHASONE SODIUM PHOSPHATE 4 MG/ML IJ SOLN
INTRAMUSCULAR | Status: DC | PRN
  Administered 2015-08-02: 4 mg via INTRAVENOUS

## 2015-08-02 MED ORDER — BUPIVACAINE HCL (PF) 0.25 % IJ SOLN
INTRAMUSCULAR | Status: DC | PRN
  Administered 2015-08-02: 10 mL

## 2015-08-02 MED ORDER — CEFAZOLIN SODIUM-DEXTROSE 2-3 GM-% IV SOLR
2.0000 g | INTRAVENOUS | Status: AC
  Administered 2015-08-02: 2 g via INTRAVENOUS
  Filled 2015-08-02: qty 50

## 2015-08-02 MED ORDER — KETOROLAC TROMETHAMINE 30 MG/ML IJ SOLN
30.0000 mg | Freq: Once | INTRAMUSCULAR | Status: AC
  Administered 2015-08-02: 30 mg via INTRAVENOUS
  Filled 2015-08-02: qty 1

## 2015-08-02 MED ORDER — ONDANSETRON HCL 4 MG/2ML IJ SOLN
INTRAMUSCULAR | Status: DC | PRN
  Administered 2015-08-02: 4 mg via INTRAVENOUS

## 2015-08-02 SURGICAL SUPPLY — 38 items
BANDAGE ELASTIC 3 VELCRO ST LF (GAUZE/BANDAGES/DRESSINGS) ×3 IMPLANT
BLADE SURG 15 STRL LF DISP TIS (BLADE) ×1 IMPLANT
BLADE SURG 15 STRL SS (BLADE) ×3
BNDG CMPR 9X4 STRL LF SNTH (GAUZE/BANDAGES/DRESSINGS) ×1
BNDG CONFORM 3 STRL LF (GAUZE/BANDAGES/DRESSINGS) ×3 IMPLANT
BNDG ESMARK 4X9 LF (GAUZE/BANDAGES/DRESSINGS) ×3 IMPLANT
CORDS BIPOLAR (ELECTRODE) ×3 IMPLANT
COVER BACK TABLE 60X90IN (DRAPES) ×3 IMPLANT
CUFF TOURNIQUET SINGLE 18IN (TOURNIQUET CUFF) IMPLANT
DRAPE EXTREMITY T 121X128X90 (DRAPE) ×3 IMPLANT
DRAPE LG THREE QUARTER DISP (DRAPES) ×3 IMPLANT
DRAPE SURG 17X23 STRL (DRAPES) ×3 IMPLANT
DRSG EMULSION OIL 3X3 NADH (GAUZE/BANDAGES/DRESSINGS) IMPLANT
GAUZE XEROFORM 1X8 LF (GAUZE/BANDAGES/DRESSINGS) ×3 IMPLANT
GLOVE BIO SURGEON STRL SZ8 (GLOVE) ×3 IMPLANT
GLOVE BIOGEL PI IND STRL 8.5 (GLOVE) ×1 IMPLANT
GLOVE BIOGEL PI INDICATOR 8.5 (GLOVE) ×2
GOWN STRL REUS W/ TWL LRG LVL3 (GOWN DISPOSABLE) ×2 IMPLANT
GOWN STRL REUS W/TWL LRG LVL3 (GOWN DISPOSABLE) ×6
KNIFE CARPAL TUNNEL (BLADE) IMPLANT
MANIFOLD NEPTUNE II (INSTRUMENTS) IMPLANT
NDL HYPO 25X1 1.5 SAFETY (NEEDLE) ×2 IMPLANT
NDL SAFETY ECLIPSE 18X1.5 (NEEDLE) IMPLANT
NEEDLE HYPO 18GX1.5 SHARP (NEEDLE) ×6
NEEDLE HYPO 25X1 1.5 SAFETY (NEEDLE) ×6 IMPLANT
NS IRRIG 500ML POUR BTL (IV SOLUTION) ×3 IMPLANT
PACK BASIN DAY SURGERY FS (CUSTOM PROCEDURE TRAY) ×3 IMPLANT
PAD ALCOHOL SWAB (MISCELLANEOUS) ×12 IMPLANT
PAD CAST 3X4 CTTN HI CHSV (CAST SUPPLIES) IMPLANT
PADDING CAST COTTON 3X4 STRL (CAST SUPPLIES)
SPONGE GAUZE 4X4 12PLY STER LF (GAUZE/BANDAGES/DRESSINGS) ×3 IMPLANT
STOCKINETTE 4X48 STRL (DRAPES) ×3 IMPLANT
SUT PROLENE 4 0 PS 2 18 (SUTURE) ×3 IMPLANT
SYR BULB 3OZ (MISCELLANEOUS) ×3 IMPLANT
SYR CONTROL 10ML LL (SYRINGE) ×6 IMPLANT
TOWEL OR 17X24 6PK STRL BLUE (TOWEL DISPOSABLE) ×3 IMPLANT
TRAY DSU PREP LF (CUSTOM PROCEDURE TRAY) ×3 IMPLANT
UNDERPAD 30X30 INCONTINENT (UNDERPADS AND DIAPERS) ×3 IMPLANT

## 2015-08-02 NOTE — Transfer of Care (Signed)
Last Vitals:  Filed Vitals:   08/02/15 1120  BP: 115/71  Pulse: 84  Temp: 37 C  Resp: 22    Immediate Anesthesia Transfer of Care Note  Patient: Erica Lang  Procedure(s) Performed: Procedure(s) (LRB): RIGHT HAND CARPAL TUNNEL RELEASE (Right)  Patient Location: PACU  Anesthesia Type:MAC   Level of Consciousness: awake, alert  and oriented  Airway & Oxygen Therapy: Patient Spontanous Breathing and Patient connected to face mask oxygen  Post-op Assessment: Report given to PACU RN and Post -op Vital signs reviewed and stable  Post vital signs: Reviewed and stable  Complications: No apparent anesthesia complications

## 2015-08-02 NOTE — Op Note (Signed)
PREOPERATIVE DIAGNOSIS: RIGHT hand severe carpal tunnel syndrome.  POSTOPERATIVE DIAGNOSIS: RIGHT hand severe carpal tunnel syndrome.  SURGICAL PROCEDURE: Right hand carpal tunnel release.  ATTENDING PHYSICIAN: Sharma Covert, MD, who scrubbed and present for the entire procedure.  ASSISTANT SURGEON: None.  ANESTHESIA: Xylocaine 1% and 0.25% Marcaine local block with IV sedation.  SURGICAL INDICATIONS: The patient is a right-hand-dominant female, who is followed in the office with severe carpal tunnel syndrome. The patient has failed nonsurgical treatment and elected to undergo the above procedure. Risks, benefits, and alternatives were discussed in detail with the patient and signed informed consent was obtained. Risks include, but not limited to bleeding, infection, damage to nearby nerves, arteries or tendons, loss of motion to the wrist and digits, incomplete relief of symptoms, and need for further surgical intervention.  DESCRIPTION OF PROCEDURE: The patient was properly identified in the preoperative holding area and a mark with a permanent marker made on the right hand to indicate the correct operative site. The patient was then brought back to the operating room, placed supine on the anesthesia room table where the IV sedation was administered. The patient tolerated this well. Xylocaine 1% and 0.25% Marcaine local block was then performed. The patient tolerated this well. A well-padded tourniquet was placed in left brachium and sealed with 1000 drape. Left upper extremity was then prepped and draped in normal sterile fashion. Time- out was called, correct side was identified, and procedure then begun. Attention then turned to left hand. Limb was elevated and tourniquet insufflated. Several cm incision made directly in mid palm. Dissection was carried down through the skin and subcutaneous tissue. The palmar fascia was incised  longitudinally. Deep dissection carried onto the transverse carpal ligament and under direct visualization, the distal one-half of the transverse carpal ligament released. Further exposure was then carried out approximating first the transverse carpal ligament as well as the portion of the antebrachial fascia was then released. Contents of carpal canal were then inspected. No other abnormalities noted. The tourniquet was deflated. Hemostasis was obtained. Skin was then closed using horizontal mattress Prolene sutures. Xeroform dressing and sterile compressive bandage were then applied. The patient tolerated the procedure well and returned to the recovery room in good condition.  POSTPROCEDURAL PLAN: The patient discharged to home, seen back in the office in approximately 2 weeks for wound check, suture removal, and Gel Flex Glove. Begin postoperative carpal tunnel evaluation and treat.

## 2015-08-02 NOTE — H&P (Signed)
Erica Lang is an 39 y.o. female.   Chief Complaint: right hand numbness HPI: pt followed in office Pt with > 6 months of bilateral pain and tingling in hands Here for surgery on both hands No prior surgery to right hand Elects right hand surgery today  Past Medical History  Diagnosis Date  . Anemia   . Anxiety   . PCOS (polycystic ovarian syndrome)   . History of exercise stress test dr Eden Emms    11-11-2013--  normal/  good exercise capacity, no chest pain, no ischemia  . History of abnormal cervical Pap smear   . History of pre-eclampsia 11/ 2014    hypertension and postpartum pulmonary edema--  resolved  . Bilateral carpal tunnel syndrome   . Mild asthma   . IBS (irritable bowel syndrome)     Past Surgical History  Procedure Laterality Date  . Cholecystectomy N/A 10/27/2014    Procedure: LAPAROSCOPIC CHOLECYSTECTOMY WITH INTRAOPERATIVE CHOLANGIOGRAM;  Surgeon: Atilano Ina, MD;  Location: [facility];  Service: General;  Laterality: N/A;  . I & d  left vulvar hematoma, postpartum  07-05-2009  . Transthoracic echocardiogram  10-28-2013    normal LVF,  ef 60-65%,  mild AR , MR and , TR    Family History  Problem Relation Age of Onset  . Stroke Mother   . Hypertension Mother   . Heart disease Mother   . Deep vein thrombosis Mother     both legs, arms and back, 2 blood clots migrted to her chest area  . Diabetes Mother    Social History:  reports that she has never smoked. She has never used smokeless tobacco. She reports that she drinks alcohol. She reports that she does not use illicit drugs.  Allergies:  Allergies  Allergen Reactions  . Bee Venom Anaphylaxis    Medications Prior to Admission  Medication Sig Dispense Refill  . acetaminophen (TYLENOL) 500 MG tablet Take 1,000 mg by mouth every 6 (six) hours as needed for headache.    . Multiple Vitamins-Minerals (MULTIVITAMIN WITH MINERALS) tablet Take 1 tablet by mouth daily.    Marland Kitchen albuterol (PROVENTIL HFA;VENTOLIN  HFA) 108 (90 BASE) MCG/ACT inhaler Inhale 2 puffs into the lungs every 6 (six) hours as needed for wheezing. 1 Inhaler 0  . EPINEPHrine (EPI-PEN) 0.3 mg/0.3 mL DEVI Inject 0.3 mLs (0.3 mg total) into the muscle once. 1 Device 0    Results for orders placed or performed during the hospital encounter of 08/02/15 (from the past 48 hour(s))  Pregnancy, urine POC     Status: None   Collection Time: 08/02/15 11:40 AM  Result Value Ref Range   Preg Test, Ur NEGATIVE NEGATIVE    Comment:        THE SENSITIVITY OF THIS METHODOLOGY IS >24 mIU/mL    No results found.  ROSNO RECENT ILLNESSES OR HOSPITALIZATIONS  Blood pressure 115/71, pulse 84, temperature 98.6 F (37 C), temperature source Oral, resp. rate 22, height  (1.778 m), weight 111.585 kg (246 lb), last menstrual period 07/22/2015, SpO2 100 %, not currently breastfeeding. Physical Exam  General Appearance:  Alert, cooperative, no distress, appears stated age  Head:  Normocephalic, without obvious abnormality, atraumatic  Eyes:  Pupils equal, conjunctiva/corneas clear,         Throat: Lips, mucosa, and tongue normal; teeth and gums normal  Neck: No visible masses     Lungs:   respirations unlabored  Chest Wall:  No tenderness or deformity  Heart:  Regular rate and rhythm,  Abdomen:   Soft, non-tender,         Extremities: RIGHT HAND: FINGERS WARM WELL PERFUSED ABLE TO EXTEND THUMB ABLE TO PALMAR ABDUCT THUMB FINGERS WARM WELL PERFUSED  Pulses: 2+ and symmetric  Skin: Skin color, texture, turgor normal, no rashes or lesions     Neurologic: Normal    Assessment/Plan RIGHT HAND CARPAL TUNNEL SYNDROME  RIGHT HAND CARPAL TUNNEL RELEASE  R/B/A DISCUSSED WITH PT IN OFFICE.  PT VOICED UNDERSTANDING OF PLAN CONSENT SIGNED DAY OF SURGERY PT SEEN AND EXAMINED PRIOR TO OPERATIVE PROCEDURE/DAY OF SURGERY SITE MARKED. QUESTIONS ANSWERED WILL GO HOME FOLLOWING SURGERY  WE ARE PLANNING SURGERY FOR YOUR UPPER EXTREMITY. THE  RISKS AND BENEFITS OF SURGERY INCLUDE BUT NOT LIMITED TO BLEEDING INFECTION, DAMAGE TO NEARBY NERVES ARTERIES TENDONS, FAILURE OF SURGERY TO ACCOMPLISH ITS INTENDED GOALS, PERSISTENT SYMPTOMS AND NEED FOR FURTHER SURGICAL INTERVENTION. WITH THIS IN MIND WE WILL PROCEED. I HAVE DISCUSSED WITH THE PATIENT THE PRE AND POSTOPERATIVE REGIMEN AND THE DOS AND DON'TS. PT VOICED UNDERSTANDING AND INFORMED CONSENT SIGNED.  Sharma Covert 08/02/2015, 1:12 PM

## 2015-08-02 NOTE — Anesthesia Postprocedure Evaluation (Signed)
  Anesthesia Post-op Note  Patient: Erica Lang  Procedure(s) Performed: Procedure(s) (LRB): RIGHT HAND CARPAL TUNNEL RELEASE (Right)  Patient Location: PACU  Anesthesia Type: MAC  Level of Consciousness: awake and alert   Airway and Oxygen Therapy: Patient Spontanous Breathing  Post-op Pain: mild  Post-op Assessment: Post-op Vital signs reviewed, Patient's Cardiovascular Status Stable, Respiratory Function Stable, Patent Airway and No signs of Nausea or vomiting  Last Vitals:  Filed Vitals:   08/02/15 1357  BP:   Pulse: 89  Temp: 36.6 C  Resp: 16    Post-op Vital Signs: stable   Complications: No apparent anesthesia complications

## 2015-08-02 NOTE — Discharge Instructions (Signed)
KEEP BANDAGE CLEAN AND DRY CALL OFFICE FOR F/U APPT [phone] IN 14 DAYS DR Melvyn Novas CELL [phone] KEEP HAND ELEVATED ABOVE HEART OK TO APPLY ICE TO OPERATIVE AREA CONTACT OFFICE IF ANY WORSENING PAIN OR CONCERNS. Post Anesthesia Home Care Instructions  Activity: Get plenty of rest for the remainder of the day. A responsible adult should stay with you for 24 hours following the procedure.  For the next 24 hours, DO NOT: -Drive a car -Advertising copywriter -Drink alcoholic beverages -Take any medication unless instructed by your physician -Make any legal decisions or sign important papers.  Meals: Start with liquid foods such as gelatin or soup. Progress to regular foods as tolerated. Avoid greasy, spicy, heavy foods. If nausea and/or vomiting occur, drink only clear liquids until the nausea and/or vomiting subsides. Call your physician if vomiting continues.  Special Instructions/Symptoms: Your throat may feel dry or sore from the anesthesia or the breathing tube placed in your throat during surgery. If this causes discomfort, gargle with warm salt water. The discomfort should disappear within 24 hours.   Carpal Tunnel Release (Repair), Care After Refer to this sheet in the next few weeks. These discharge instructions provide you with general information on caring for yourself after you leave the hospital. Your caregiver may also give you specific instructions. Your treatment has been planned according to the most current medical practices available, but unavoidable complications sometimes occur. If you have any problems or questions after discharge, please call your caregiver. HOME CARE INSTRUCTIONS   Have a responsible person with you for 24 hours.  Do not drive a car or take public transportation for 24 hours.  Only take over-the-counter or prescription medicines for pain, discomfort, or fever as directed by your caregiver. Take them as directed.  You may put ice on the palm side  of the affected wrist.  Put ice in a plastic bag.  Place a towel between your skin and the bag.  Leave the ice on for 15-20 minutes, 03-04 times per day.  If you were given a splint to keep your wrist from bending, use it as directed. It is important to wear the splint at night or as directed. Use the splint for as long as you have pain or numbness in your hand, arm, or wrist. This may take 1 to 2 months.  Keep your hand raised (elevated) above the level of your heart as much as possible. This keeps swelling down and helps with discomfort.  Change bandages (dressings) as directed.  Keep the wound clean and dry. SEEK MEDICAL CARE IF:   You develop pain not relieved with medicines.  You develop numbness of your hand.  You develop bleeding from your surgical site.  You have an oral temperature above 102 F (38.9 C).  You develop redness or swelling of the surgical site.  You develop new, unexplained problems. SEEK IMMEDIATE MEDICAL CARE IF:   You develop a rash.  You have difficulty breathing.  You develop any reaction or side effects to medicines given. MAKE SURE YOU:   Understand these instructions.  Will watch your condition.  Will get help right away if you are not doing well or get worse. Document Released: 07/04/2005 Document Revised: 10/05/2013 Document Reviewed: 10/21/2007 [facility] Patient Information 2015 [place], [place]. This information is not intended to replace advice given to you by your health care provider. Make sure you discuss any questions you have with your health care provider.

## 2015-08-02 NOTE — Anesthesia Preprocedure Evaluation (Signed)
Anesthesia Evaluation  Patient identified by MRN, date of birth, ID band Patient awake    Reviewed: Allergy & Precautions, NPO status , Patient's Chart, lab work & pertinent test results  History of Anesthesia Complications Negative for: history of anesthetic complications  Airway Mallampati: II  TM Distance: >3 FB Neck ROM: Full    Dental no notable dental hx. (+) Dental Advisory Given   Pulmonary asthma ,  breath sounds clear to auscultation  Pulmonary exam normal       Cardiovascular hypertension, Normal cardiovascular examRhythm:Regular Rate:Normal     Neuro/Psych  Headaches, PSYCHIATRIC DISORDERS Anxiety    GI/Hepatic negative GI ROS, Neg liver ROS,   Endo/Other  obesity  Renal/GU negative Renal ROS  negative genitourinary   Musculoskeletal negative musculoskeletal ROS (+)   Abdominal   Peds negative pediatric ROS (+)  Hematology  (+) anemia ,   Anesthesia Other Findings   Reproductive/Obstetrics negative OB ROS                             Anesthesia Physical Anesthesia Plan  ASA: II  Anesthesia Plan: MAC   Post-op Pain Management:    Induction: Intravenous  Airway Management Planned:   Additional Equipment:   Intra-op Plan:   Post-operative Plan:   Informed Consent: I have reviewed the patients History and Physical, chart, labs and discussed the procedure including the risks, benefits and alternatives for the proposed anesthesia with the patient or authorized representative who has indicated his/her understanding and acceptance.   Dental advisory given  Plan Discussed with: CRNA  Anesthesia Plan Comments:         Anesthesia Quick Evaluation

## 2015-08-03 ENCOUNTER — Encounter (HOSPITAL_BASED_OUTPATIENT_CLINIC_OR_DEPARTMENT_OTHER): Payer: Self-pay | Admitting: Orthopedic Surgery

## 2015-08-03 LAB — POCT HEMOGLOBIN-HEMACUE: HEMOGLOBIN: 16.5 g/dL — AB (ref 12.0–15.0)

## 2016-01-23 ENCOUNTER — Other Ambulatory Visit: Payer: Self-pay | Admitting: Physician Assistant

## 2016-01-23 DIAGNOSIS — N644 Mastodynia: Secondary | ICD-10-CM

## 2016-01-28 ENCOUNTER — Ambulatory Visit
Admission: RE | Admit: 2016-01-28 | Discharge: 2016-01-28 | Disposition: A | Discharge status: Home / Self Care | Payer: BLUE CROSS/BLUE SHIELD | Source: Ambulatory Visit | Attending: Physician Assistant | Admitting: Physician Assistant

## 2016-01-28 DIAGNOSIS — N644 Mastodynia: Secondary | ICD-10-CM

## 2016-05-29 ENCOUNTER — Inpatient Hospital Stay (HOSPITAL_COMMUNITY): Payer: BLUE CROSS/BLUE SHIELD

## 2016-05-29 ENCOUNTER — Encounter (HOSPITAL_COMMUNITY): Payer: Self-pay | Admitting: Emergency Medicine

## 2016-05-29 ENCOUNTER — Inpatient Hospital Stay (HOSPITAL_COMMUNITY)
Admission: EM | Admit: 2016-05-29 | Discharge: 2016-05-31 | DRG: 872 | Disposition: A | Discharge status: Home / Self Care | Payer: BLUE CROSS/BLUE SHIELD | Attending: Internal Medicine | Admitting: Internal Medicine

## 2016-05-29 ENCOUNTER — Emergency Department (HOSPITAL_COMMUNITY): Payer: BLUE CROSS/BLUE SHIELD

## 2016-05-29 DIAGNOSIS — Z833 Family history of diabetes mellitus: Secondary | ICD-10-CM

## 2016-05-29 DIAGNOSIS — N10 Acute tubulo-interstitial nephritis: Secondary | ICD-10-CM | POA: Diagnosis present

## 2016-05-29 DIAGNOSIS — R519 Headache, unspecified: Secondary | ICD-10-CM | POA: Diagnosis present

## 2016-05-29 DIAGNOSIS — E669 Obesity, unspecified: Secondary | ICD-10-CM | POA: Diagnosis present

## 2016-05-29 DIAGNOSIS — Z823 Family history of stroke: Secondary | ICD-10-CM

## 2016-05-29 DIAGNOSIS — F419 Anxiety disorder, unspecified: Secondary | ICD-10-CM | POA: Diagnosis present

## 2016-05-29 DIAGNOSIS — N12 Tubulo-interstitial nephritis, not specified as acute or chronic: Secondary | ICD-10-CM | POA: Diagnosis not present

## 2016-05-29 DIAGNOSIS — J45909 Unspecified asthma, uncomplicated: Secondary | ICD-10-CM | POA: Diagnosis present

## 2016-05-29 DIAGNOSIS — Z87440 Personal history of urinary (tract) infections: Secondary | ICD-10-CM

## 2016-05-29 DIAGNOSIS — Z91030 Bee allergy status: Secondary | ICD-10-CM

## 2016-05-29 DIAGNOSIS — Z79899 Other long term (current) drug therapy: Secondary | ICD-10-CM

## 2016-05-29 DIAGNOSIS — Z8249 Family history of ischemic heart disease and other diseases of the circulatory system: Secondary | ICD-10-CM

## 2016-05-29 DIAGNOSIS — I10 Essential (primary) hypertension: Secondary | ICD-10-CM | POA: Diagnosis present

## 2016-05-29 DIAGNOSIS — K589 Irritable bowel syndrome without diarrhea: Secondary | ICD-10-CM | POA: Diagnosis present

## 2016-05-29 DIAGNOSIS — R51 Headache: Secondary | ICD-10-CM | POA: Diagnosis present

## 2016-05-29 DIAGNOSIS — A419 Sepsis, unspecified organism: Secondary | ICD-10-CM

## 2016-05-29 DIAGNOSIS — Z6835 Body mass index (BMI) 35.0-35.9, adult: Secondary | ICD-10-CM

## 2016-05-29 DIAGNOSIS — N200 Calculus of kidney: Secondary | ICD-10-CM | POA: Diagnosis present

## 2016-05-29 DIAGNOSIS — E282 Polycystic ovarian syndrome: Secondary | ICD-10-CM | POA: Diagnosis present

## 2016-05-29 DIAGNOSIS — E6609 Other obesity due to excess calories: Secondary | ICD-10-CM | POA: Diagnosis present

## 2016-05-29 DIAGNOSIS — N390 Urinary tract infection, site not specified: Secondary | ICD-10-CM | POA: Diagnosis not present

## 2016-05-29 LAB — COMPREHENSIVE METABOLIC PANEL
ALT: 24 U/L (ref 14–54)
ANION GAP: 9 (ref 5–15)
AST: 27 U/L (ref 15–41)
Albumin: 3.9 g/dL (ref 3.5–5.0)
Alkaline Phosphatase: 58 U/L (ref 38–126)
BUN: 10 mg/dL (ref 6–20)
CALCIUM: 8.7 mg/dL — AB (ref 8.9–10.3)
CO2: 23 mmol/L (ref 22–32)
Chloride: 104 mmol/L (ref 101–111)
Creatinine, Ser: 0.97 mg/dL (ref 0.44–1.00)
GLUCOSE: 152 mg/dL — AB (ref 65–99)
Potassium: 3.6 mmol/L (ref 3.5–5.1)
Sodium: 136 mmol/L (ref 135–145)
TOTAL PROTEIN: 7.9 g/dL (ref 6.5–8.1)
Total Bilirubin: 0.7 mg/dL (ref 0.3–1.2)

## 2016-05-29 LAB — IRON AND TIBC
IRON: 11 ug/dL — AB (ref 28–170)
Saturation Ratios: 4 % — ABNORMAL LOW (ref 10.4–31.8)
TIBC: 309 ug/dL (ref 250–450)
UIBC: 298 ug/dL

## 2016-05-29 LAB — URINE MICROSCOPIC-ADD ON

## 2016-05-29 LAB — I-STAT BETA HCG BLOOD, ED (MC, WL, AP ONLY): I-stat hCG, quantitative: <5 m[IU]/mL (ref <5)

## 2016-05-29 LAB — FERRITIN: FERRITIN: 144 ng/mL (ref 11–307)

## 2016-05-29 LAB — RETICULOCYTES
RBC.: 3.18 MIL/uL — ABNORMAL LOW (ref 3.87–5.11)
Retic Count, Absolute: 54.1 10*3/uL (ref 19.0–186.0)
Retic Ct Pct: 1.7 % (ref 0.4–3.1)

## 2016-05-29 LAB — URINALYSIS, ROUTINE W REFLEX MICROSCOPIC
Bilirubin Urine: NEGATIVE
GLUCOSE, UA: NEGATIVE mg/dL
Ketones, ur: NEGATIVE mg/dL
Nitrite: NEGATIVE
PH: 6.5 (ref 5.0–8.0)
PROTEIN: 30 mg/dL — AB
Specific Gravity, Urine: >1.046 — ABNORMAL HIGH (ref 1.005–1.030)

## 2016-05-29 LAB — CBC
HCT: 33.3 % — ABNORMAL LOW (ref 36.0–46.0)
Hemoglobin: 11.1 g/dL — ABNORMAL LOW (ref 12.0–15.0)
MCH: 30.7 pg (ref 26.0–34.0)
MCHC: 33.3 g/dL (ref 30.0–36.0)
MCV: 92.2 fL (ref 78.0–100.0)
Platelets: 191 10*3/uL (ref 150–400)
RBC: 3.61 MIL/uL — ABNORMAL LOW (ref 3.87–5.11)
RDW: 13.5 % (ref 11.5–15.5)
WBC: 15.5 10*3/uL — ABNORMAL HIGH (ref 4.0–10.5)

## 2016-05-29 LAB — VITAMIN B12: Vitamin B-12: 298 pg/mL (ref 180–914)

## 2016-05-29 LAB — MRSA PCR SCREENING: MRSA BY PCR: NEGATIVE

## 2016-05-29 LAB — PROTIME-INR
INR: 1.3 (ref 0.00–1.49)
PROTHROMBIN TIME: 15.8 s — AB (ref 11.6–15.2)

## 2016-05-29 LAB — PROCALCITONIN: Procalcitonin: 0.37 ng/mL

## 2016-05-29 LAB — LIPASE, BLOOD: Lipase: 18 U/L (ref 11–51)

## 2016-05-29 LAB — I-STAT CG4 LACTIC ACID, ED: LACTIC ACID, VENOUS: 0.67 mmol/L (ref 0.5–2.0)

## 2016-05-29 LAB — FOLATE: Folate: 14.8 ng/mL (ref >5.9)

## 2016-05-29 LAB — MAGNESIUM: MAGNESIUM: 1.8 mg/dL (ref 1.7–2.4)

## 2016-05-29 LAB — LACTIC ACID, PLASMA: LACTIC ACID, VENOUS: 0.9 mmol/L (ref 0.5–2.0)

## 2016-05-29 LAB — APTT: aPTT: 29 seconds (ref 24–37)

## 2016-05-29 MED ORDER — PROCHLORPERAZINE EDISYLATE 5 MG/ML IJ SOLN
10.0000 mg | Freq: Once | INTRAMUSCULAR | Status: AC
  Administered 2016-05-29: 10 mg via INTRAVENOUS
  Filled 2016-05-29: qty 2

## 2016-05-29 MED ORDER — SODIUM CHLORIDE 0.9 % IV BOLUS (SEPSIS)
1000.0000 mL | Freq: Once | INTRAVENOUS | Status: AC
  Administered 2016-05-29: 1000 mL via INTRAVENOUS

## 2016-05-29 MED ORDER — MULTI-VITAMIN/MINERALS PO TABS: 1.0000 | ORAL_TABLET | Freq: Every day | ORAL | Status: DC

## 2016-05-29 MED ORDER — IOPAMIDOL (ISOVUE-300) INJECTION 61%
100.0000 mL | Freq: Once | INTRAVENOUS | Status: AC | PRN
  Administered 2016-05-29: 100 mL via INTRAVENOUS

## 2016-05-29 MED ORDER — OXYCODONE HCL 5 MG PO TABS
5.0000 mg | ORAL_TABLET | ORAL | Status: DC | PRN
  Administered 2016-05-30: 5 mg via ORAL
  Filled 2016-05-29: qty 1

## 2016-05-29 MED ORDER — ONDANSETRON HCL 4 MG/2ML IJ SOLN
4.0000 mg | Freq: Once | INTRAMUSCULAR | Status: AC
  Administered 2016-05-29: 4 mg via INTRAVENOUS
  Filled 2016-05-29: qty 2

## 2016-05-29 MED ORDER — ONDANSETRON HCL 4 MG PO TABS: 4.0000 mg | ORAL_TABLET | Freq: Four times a day (QID) | ORAL | Status: DC | PRN

## 2016-05-29 MED ORDER — ALBUTEROL SULFATE (2.5 MG/3ML) 0.083% IN NEBU: 2.5000 mg | INHALATION_SOLUTION | RESPIRATORY_TRACT | Status: DC | PRN

## 2016-05-29 MED ORDER — ACETAMINOPHEN 650 MG RE SUPP: 650.0000 mg | Freq: Four times a day (QID) | RECTAL | Status: DC | PRN

## 2016-05-29 MED ORDER — DEXTROSE 5 % IV SOLN
1.0000 g | Freq: Once | INTRAVENOUS | Status: AC
  Administered 2016-05-29: 1 g via INTRAVENOUS
  Filled 2016-05-29: qty 10

## 2016-05-29 MED ORDER — MORPHINE SULFATE (PF) 2 MG/ML IV SOLN
2.0000 mg | INTRAVENOUS | Status: DC | PRN
  Administered 2016-05-29 (×3): 2 mg via INTRAVENOUS
  Administered 2016-05-30: 4 mg via INTRAVENOUS
  Filled 2016-05-29: qty 1
  Filled 2016-05-29: qty 2
  Filled 2016-05-29 (×2): qty 1

## 2016-05-29 MED ORDER — HYDROMORPHONE HCL 1 MG/ML IJ SOLN
0.5000 mg | Freq: Once | INTRAMUSCULAR | Status: AC
  Administered 2016-05-29: 0.5 mg via INTRAVENOUS
  Filled 2016-05-29: qty 1

## 2016-05-29 MED ORDER — SENNA 8.6 MG PO TABS
1.0000 | ORAL_TABLET | Freq: Two times a day (BID) | ORAL | Status: DC
  Filled 2016-05-29 (×4): qty 1

## 2016-05-29 MED ORDER — ALBUTEROL SULFATE (2.5 MG/3ML) 0.083% IN NEBU: 3.0000 mL | INHALATION_SOLUTION | Freq: Four times a day (QID) | RESPIRATORY_TRACT | Status: DC | PRN

## 2016-05-29 MED ORDER — ONDANSETRON HCL 4 MG/2ML IJ SOLN: 4.0000 mg | Freq: Four times a day (QID) | INTRAMUSCULAR | Status: DC | PRN

## 2016-05-29 MED ORDER — DEXTROSE 5 % IV SOLN
2.0000 g | Freq: Once | INTRAVENOUS | Status: DC
  Filled 2016-05-29: qty 2

## 2016-05-29 MED ORDER — FLEET ENEMA 7-19 GM/118ML RE ENEM: 1.0000 | ENEMA | Freq: Once | RECTAL | Status: DC | PRN

## 2016-05-29 MED ORDER — SODIUM CHLORIDE 0.9 % IV SOLN
INTRAVENOUS | Status: DC
Start: 2016-05-29 — End: 2016-05-30
  Administered 2016-05-29: 125 mL/h via INTRAVENOUS
  Administered 2016-05-30: 06:00:00 via INTRAVENOUS

## 2016-05-29 MED ORDER — SORBITOL 70 % SOLN: 30.0000 mL | Freq: Every day | Status: DC | PRN

## 2016-05-29 MED ORDER — SODIUM CHLORIDE 0.9% FLUSH
3.0000 mL | Freq: Two times a day (BID) | INTRAVENOUS | Status: DC
  Administered 2016-05-30 (×2): 3 mL via INTRAVENOUS

## 2016-05-29 MED ORDER — ADULT MULTIVITAMIN W/MINERALS CH
1.0000 | ORAL_TABLET | Freq: Every day | ORAL | Status: DC
  Administered 2016-05-29 – 2016-05-31 (×3): 1 via ORAL
  Filled 2016-05-29 (×3): qty 1

## 2016-05-29 MED ORDER — DEXTROSE 5 % IV SOLN
2.0000 g | INTRAVENOUS | Status: DC
  Administered 2016-05-30: 2 g via INTRAVENOUS
  Filled 2016-05-29 (×2): qty 2

## 2016-05-29 MED ORDER — CEFTRIAXONE SODIUM 1 G IJ SOLR
1.0000 g | INTRAMUSCULAR | Status: AC
  Administered 2016-05-29: 1 g via INTRAVENOUS
  Filled 2016-05-29: qty 10

## 2016-05-29 MED ORDER — ACETAMINOPHEN 325 MG PO TABS
650.0000 mg | ORAL_TABLET | Freq: Four times a day (QID) | ORAL | Status: DC | PRN
  Administered 2016-05-29 – 2016-05-30 (×2): 650 mg via ORAL
  Filled 2016-05-29 (×2): qty 2

## 2016-05-29 MED ORDER — METOCLOPRAMIDE HCL 5 MG/ML IJ SOLN
10.0000 mg | Freq: Once | INTRAMUSCULAR | Status: AC
  Administered 2016-05-29: 10 mg via INTRAVENOUS
  Filled 2016-05-29: qty 2

## 2016-05-29 MED ORDER — SENNOSIDES-DOCUSATE SODIUM 8.6-50 MG PO TABS: 1.0000 | ORAL_TABLET | Freq: Every evening | ORAL | Status: DC | PRN

## 2016-05-29 MED ORDER — KETOROLAC TROMETHAMINE 30 MG/ML IJ SOLN
30.0000 mg | Freq: Once | INTRAMUSCULAR | Status: AC
  Administered 2016-05-29: 30 mg via INTRAVENOUS
  Filled 2016-05-29: qty 1

## 2016-05-29 MED ORDER — ENOXAPARIN SODIUM 40 MG/0.4ML ~~LOC~~ SOLN
40.0000 mg | SUBCUTANEOUS | Status: DC
  Administered 2016-05-29 – 2016-05-30 (×2): 40 mg via SUBCUTANEOUS
  Filled 2016-05-29 (×2): qty 0.4

## 2016-05-29 NOTE — H&P (Signed)
History and Physical    Erica Lang:[id number] DOB: 1976/02/11 DOA: 05/29/2016  PCP: Erica Has, MD Patient coming from: Home  Chief Complaint: Fever/abdominal pain/nausea  HPI: Erica Lang is a 40 y.o. female with medical history significant of polycystic ovarian syndrome, anxiety, anemia presenting to the ED with a three-day history of high fevers with temperatures as high as 104, headache, facial pain, lower abdominal pain and right lower back pain with associated strong urine odor or, chills, palpitations, nausea, emesis. Patient also endorses some constipation. Patient denies any chest pain, no shortness of breath, no melena, no hematemesis, no hematochezia, no cough, no visual changes, no asymmetric weakness or numbness.  ED Course: Patient was seen in the ED, compressive metabolic profile unremarkable. CBC had a white count of 15.5 hemoglobin of 11.1. Chest x-ray no acute cardiopulmonary disease. Urinalysis had moderate hemoglobin trace leukocytes, negative nitrite, 6-30 WBCs, specific gravity greater than 1.046. CT abdomen and pelvis which was done showed findings consistent with right-sided pyelonephritis. Somewhat more focal area of hypoattenuation medially is favored to represent lobar nephronia. Segment 4 area of anterior hepatic lobe hypoattenuation is favored to represent focal steatosis and a perfusion anomaly. Right nephrolithiasis. Age advanced atherosclerosis. Patient noted to have a temperature of 103 in the emergency room with heart rates of 110 and borderline blood pressure. Patient was given IV Rocephin. [facility] were called to admit the patient for further evaluation and management.  Review of Systems: As per HPI otherwise 10 point review of systems negative.   Past Medical History  Diagnosis Date  . Anemia   . Anxiety   . PCOS (polycystic ovarian syndrome)   . History of exercise stress test dr Erica Lang    11-11-2013--  normal/  good exercise  capacity, no chest pain, no ischemia  . History of abnormal cervical Pap smear   . History of pre-eclampsia 11/ 2014    hypertension and postpartum pulmonary edema--  resolved  . Bilateral carpal tunnel syndrome   . Mild asthma   . IBS (irritable bowel syndrome)     Past Surgical History  Procedure Laterality Date  . Cholecystectomy N/A 10/27/2014    Procedure: LAPAROSCOPIC CHOLECYSTECTOMY WITH INTRAOPERATIVE CHOLANGIOGRAM;  Surgeon: Atilano Ina, MD;  Location: [facility];  Service: General;  Laterality: N/A;  . I & d  left vulvar hematoma, postpartum  07-05-2009  . Transthoracic echocardiogram  10-28-2013    normal LVF,  ef 60-65%,  mild AR , MR and , TR  . Carpal tunnel release Right 08/02/2015    Procedure: RIGHT HAND CARPAL TUNNEL RELEASE;  Surgeon: Erica Bienenstock, MD;  Location: [facility] [place];  Service: Orthopedics;  Laterality: Right;     reports that she Lang never smoked. She Lang never used smokeless tobacco. She reports that she drinks alcohol. She reports that she does not use illicit drugs.  Allergies  Allergen Reactions  . Bee Venom Anaphylaxis    Family History  Problem Relation Age of Onset  . Stroke Mother   . Hypertension Mother   . Heart disease Mother   . Deep vein thrombosis Mother     both legs, arms and back, 2 blood clots migrted to her chest area  . Diabetes Mother    Mother alive age 72 with a history of 3 strokes, diabetes. Father health status unknown. 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 Authorizing Provider  acetaminophen (TYLENOL) 500 MG tablet Take 1,000 mg by mouth every 6 (six) hours as needed for fever or headache.   Yes Historical Provider, MD  albuterol (PROVENTIL HFA;VENTOLIN HFA) 108 (90 BASE) MCG/ACT inhaler Inhale 2 puffs into the lungs every 6 (six) hours as needed for wheezing. 12/08/12  Yes Thao P Le, DO  Biotin 1610910000 MCG TBDP Take 10,000 mcg  by mouth daily.   Yes Historical Provider, MD  Calcium Carb-Cholecalciferol (CALCIUM 600 + D PO) Take 1 tablet by mouth daily.   Yes Historical Provider, MD  Melatonin 10 MG TABS Take 10 mg by mouth at bedtime as needed (sleep).   Yes Historical Provider, MD  Multiple Vitamins-Minerals (MULTIVITAMIN WITH MINERALS) tablet Take 1 tablet by mouth daily.   Yes Historical Provider, MD  EPINEPHrine (EPI-PEN) 0.3 mg/0.3 mL DEVI Inject 0.3 mLs (0.3 mg total) into the muscle once. Patient not taking: Reported on 05/29/2016 04/28/12   Rickard PatienceSarah E Nelson, PA-C    Physical Exam: Filed Vitals:   05/29/16 1212 05/29/16 1351 05/29/16 1439 05/29/16 1519  BP:  115/65 103/57 115/63  Pulse:  108 96 96  Temp: 101.4 F (38.6 C)  100.3 F (37.9 C)   TempSrc: Oral  Oral   Resp:  15 16 20   SpO2:  98% 98% 99%      Constitutional: NAD, calm, comfortable Filed Vitals:   05/29/16 1212 05/29/16 1351 05/29/16 1439 05/29/16 1519  BP:  115/65 103/57 115/63  Pulse:  108 96 96  Temp: 101.4 F (38.6 C)  100.3 F (37.9 C)   TempSrc: Oral  Oral   Resp:  15 16 20   SpO2:  98% 98% 99%   Eyes: PERRLA, EOMI, lids and conjunctivae normal ENMT: Mucous membranes are dry . Posterior pharynx clear of any exudate or lesions.Normal dentition.  Neck: normal, supple, no masses, no thyromegaly Respiratory: clear to auscultation bilaterally, no wheezing, no crackles. Normal respiratory effort. No accessory muscle use.  Cardiovascular: Regular rate and rhythm, no murmurs / rubs / gallops. No extremity edema. 2+ pedal pulses. No carotid bruits.  Abdomen:  right CVA tenderness. Right lower quadrant and right suprapubic tenderness to palpation. Positive bowel sounds. Nondistended. Soft. No hepatosplenomegaly.  Musculoskeletal: no clubbing / cyanosis. No joint deformity upper and lower extremities. Good ROM, no contractures. Normal muscle tone.  Skin: no rashes, lesions, ulcers. No induration Neurologic: CN 2-12 grossly intact. Sensation  intact, DTR normal. Strength 5/5 in all 4.  Psychiatric: Normal judgment and insight. Alert and oriented x 3. Normal mood.   Labs on Admission: I have personally reviewed following labs and imaging studies  CBC:  Recent Labs Lab 05/29/16 1010  WBC 15.5*  HGB 11.1*  HCT 33.3*  MCV 92.2  PLT 191   Basic Metabolic Panel:  Recent Labs Lab 05/29/16 1010  NA 136  K 3.6  CL 104  CO2 23  GLUCOSE 152*  BUN 10  CREATININE 0.97  CALCIUM 8.7*   GFR: CrCl cannot be calculated (Unknown ideal weight.). Liver Function Tests:  Recent Labs Lab 05/29/16 1010  AST 27  ALT 24  ALKPHOS 58  BILITOT 0.7  PROT 7.9  ALBUMIN 3.9    Recent Labs Lab 05/29/16 1010  LIPASE 18   No results for input(s): AMMONIA in the last 168 hours. Coagulation Profile: No results for input(s): INR, PROTIME in the last 168 hours. Cardiac Enzymes: No results for input(s): CKTOTAL, CKMB, CKMBINDEX, TROPONINI in the last 168 hours. BNP (last 3 results) No results  for input(s): PROBNP in the last 8760 hours. HbA1C: No results for input(s): HGBA1C in the last 72 hours. CBG: No results for input(s): GLUCAP in the last 168 hours. Lipid Profile: No results for input(s): CHOL, HDL, LDLCALC, TRIG, CHOLHDL, LDLDIRECT in the last 72 hours. Thyroid Function Tests: No results for input(s): TSH, T4TOTAL, FREET4, T3FREE, THYROIDAB in the last 72 hours. Anemia Panel: No results for input(s): VITAMINB12, FOLATE, FERRITIN, TIBC, IRON, RETICCTPCT in the last 72 hours. Urine analysis:    Component Value Date/Time   COLORURINE YELLOW 05/29/2016 1244   APPEARANCEUR CLEAR 05/29/2016 1244   LABSPEC >1.046* 05/29/2016 1244   PHURINE 6.5 05/29/2016 1244   GLUCOSEU NEGATIVE 05/29/2016 1244   HGBUR MODERATE* 05/29/2016 1244   BILIRUBINUR NEGATIVE 05/29/2016 1244   KETONESUR NEGATIVE 05/29/2016 1244   PROTEINUR 30* 05/29/2016 1244   UROBILINOGEN 1.0 01/05/2015 1155   NITRITE NEGATIVE 05/29/2016 1244    LEUKOCYTESUR TRACE* 05/29/2016 1244   Sepsis Labs: !!!!!!!!!!!!!!!!!!!!!!!!!!!!!!!!!!!!!!!!!!!! (procalcitonin:4,lacticidven:4) )No results found for this or any previous visit (from the past 240 hour(s)).   Radiological Exams on Admission: Ct Abdomen Pelvis W Contrast  05/29/2016  CLINICAL DATA:  Right-sided pain and fever. EXAM: CT ABDOMEN AND PELVIS WITH CONTRAST TECHNIQUE: Multidetector CT imaging of the abdomen and pelvis was performed using the standard protocol following bolus administration of intravenous contrast. CONTRAST:  ISOVUE-300 IOPAMIDOL (ISOVUE-300) INJECTION 61% COMPARISON:  Plain films of 01/05/2015. Most recent CT of 07/20/2009. FINDINGS: Lower chest: 4 mm left lower lobe pulmonary nodule on image 1/ series 3 was present on the prior and can be considered benign. Normal heart size without pericardial or pleural effusion. Hepatobiliary: Mild hepatic steatosis suspected. More focal hypoattenuation within the anterior aspect of segment 4 including on image 27/series 2 is favored to be related to focal steatosis/perfusion anomaly. Cholecystectomy, without biliary ductal dilatation. Pancreas: Normal, without mass or ductal dilatation. Spleen: Normal in size, without focal abnormality. Adrenals/Urinary Tract: Normal adrenal glands. 4 mm right renal collecting system calculus. Left renal too small to characterize lesions. Heterogeneous enhancement with decreased corticomedullary differentiation identified within the inter and lower pole right kidney. Mild right perinephric edema/interstitial thickening. Somewhat more focal masslike area of hypoattenuation involves the interpolar right kidney on image 44/series 2. This measures on the order of 2.3 x 2.7 cm on image 22/series 7. No hydronephrosis. Normal urinary bladder. Stomach/Bowel: Normal stomach, without wall thickening. Normal colon, appendix, and terminal ileum. Normal small bowel. Vascular/Lymphatic: Significantly age  advanced but mild aortic and branch vessel atherosclerosis. Patent right renal vein. No abdominopelvic adenopathy. Reproductive: Normal uterus and adnexa. Other: No significant free fluid. Musculoskeletal: Mild disc bulges at L4-5 and L5-S1. IMPRESSION: 1. Findings consistent with right-sided pyelonephritis. A somewhat more focal area of hypoattenuation medially is favored to represent lobar nephronia. recommend appropriate antibiotic therapy and consideration imaging follow-up with pre and post contrast abdominal MRI at 2-3 months (to confirm resolution and exclude a highly unlikely concurrent neoplasm). MRI favored over CT secondary lack of radiation in this young patient. 2. Segment 4 area of anterior hepatic lobe hypoattenuation is favored to represent focal steatosis and/or perfusion anomaly. This could also be confirmed on the above follow-up MRI. 3. Right nephrolithiasis. 4. Age advanced atherosclerosis. These results will be called to the ordering clinician or representative by the Radiologist Assistant, and communication documented in the PACS or zVision Dashboard. Electronically Signed   By: Jeronimo Greaves M.D.   On: 05/29/2016 11:58    EKG: None  Assessment/Plan Principal Problem:  Sepsis (HCC) Active Problems:   Pyelonephritis   Persistent headaches   Anxiety   Hypertension   Obesity   PCOS (polycystic ovarian syndrome)   Acute UTI   #1 sepsis secondary to pyelonephritis Patient presented with sepsis and meeting the criteria based on a temperature of 103 with a heart rate in the 110s with a leukocytosis with a white count of 15.5. Patient with suprapubic abdominal pain and strong odor to her urine. CT abdomen and pelvis consistent with a right-sided perinephric stranding. Urinalysis with trace leukocytes negative nitrite 6-30 WBCs. Chest x-ray negative for any acute infiltrate. Will check urine cultures. Check blood cultures 2. Placed empirically on IV Rocephin. Urology consultation  pending.  #2 acute pyelonephritis with ? Abscess Patient noted to have acute pyelonephritis with complaints of right CVA tenderness, suprapubic abdominal pain, CT scan with right-sided perinephric stranding. Patient febrile with a temp as high as 103. Patient also with a leukocytosis. Will check urine cultures. Check blood cultures 2. Placed empirically on IV Rocephin. Urology consultation pending.  #3 persistent headaches Check CT of the head. Pain management.  #4 acute UTI Urine cultures pending. IV Rocephin.  #5 obesity  #6 hypertension Blood pressure stable. Follow for now.   DVT prophylaxis: Lovenox Code Status: Full Family Communication: Updated patient. No family at bedside. Disposition Plan: Home when afebrile for 24-48 hours, with clinical improvement and tolerating oral intake and on oral antibiotics. Consults called: Urology Admission status: Inpatient/step down unit   [facility] MD [facility] Pager [phone]  If 7PM-7AM, please contact night-coverage [email or web address] Password [facility]  05/29/2016, 3:34 PM

## 2016-05-29 NOTE — ED Notes (Addendum)
Pt reports RUQ pain with nausea x 3 days.  Reports small BM this am, but last BM was about a week ago.  Pt also reports fever of 104.5.  Pt also reports h/a which started Sunday which worsen over time.

## 2016-05-29 NOTE — ED Notes (Signed)
Pt made aware that she need to provide urine specimen, states she cannot provide urine at this time.

## 2016-05-29 NOTE — Consult Note (Signed)
Consult: Right pyelonephritis/lobar nephronia-specifically to consider if percutaneous draining is needed Requested by: Dr. Cyndie Chime  History of Present Illness: 40 year old African-American female with a history of right flank pain and fever up to 104 at home over the last 3 days.Interestingly, she had no dysuria or gross hematuria.  Her white count was 15.  UA showed a few bacteria, wbc's and rbc's. A CT scan of the abdomen was obtained which showed a right lobar nephronia and no specific fluid collection.  Radiology recommended reimaging in 2-3 months to ensure resolution and no underlying pathology.  Patient denies prior urologic history or lower urinary tract symptoms.  I do see where she had a Escherichia coli urinary tract infection January 2016.  Past Medical History  Diagnosis Date  . Anemia   . Anxiety   . PCOS (polycystic ovarian syndrome)   . History of exercise stress test dr Eden Emms    11-11-2013--  normal/  good exercise capacity, no chest pain, no ischemia  . History of abnormal cervical Pap smear   . History of pre-eclampsia 11/ 2014    hypertension and postpartum pulmonary edema--  resolved  . Bilateral carpal tunnel syndrome   . Mild asthma   . IBS (irritable bowel syndrome)    Past Surgical History  Procedure Laterality Date  . Cholecystectomy N/A 10/27/2014    Procedure: LAPAROSCOPIC CHOLECYSTECTOMY WITH INTRAOPERATIVE CHOLANGIOGRAM;  Surgeon: Atilano Ina, MD;  Location: [facility];  Service: General;  Laterality: N/A;  . I & d  left vulvar hematoma, postpartum  07-05-2009  . Transthoracic echocardiogram  10-28-2013    normal LVF,  ef 60-65%,  mild AR , MR and , TR  . Carpal tunnel release Right 08/02/2015    Procedure: RIGHT HAND CARPAL TUNNEL RELEASE;  Surgeon: Bradly Bienenstock, MD;  Location: [facility];  Service: Orthopedics;  Laterality: Right;    Home Medications:  Prescriptions prior to admission  Medication Sig Dispense Refill Last Dose  .  acetaminophen (TYLENOL) 500 MG tablet Take 1,000 mg by mouth every 6 (six) hours as needed for fever or headache.   05/29/2016 at Unknown time  . albuterol (PROVENTIL HFA;VENTOLIN HFA) 108 (90 BASE) MCG/ACT inhaler Inhale 2 puffs into the lungs every 6 (six) hours as needed for wheezing. 1 Inhaler 0 unknown  . Biotin 53664 MCG TBDP Take 10,000 mcg by mouth daily.   05/26/2016  . Calcium Carb-Cholecalciferol (CALCIUM 600 + D PO) Take 1 tablet by mouth daily.   05/26/2016  . Melatonin 10 MG TABS Take 10 mg by mouth at bedtime as needed (sleep).   Past Week at Unknown time  . Multiple Vitamins-Minerals (MULTIVITAMIN WITH MINERALS) tablet Take 1 tablet by mouth daily.   05/26/2016 at Unknown time  . EPINEPHrine (EPI-PEN) 0.3 mg/0.3 mL DEVI Inject 0.3 mLs (0.3 mg total) into the muscle once. (Patient not taking: Reported on 05/29/2016) 1 Device 0 Not Taking at Unknown time   Allergies:  Allergies  Allergen Reactions  . Bee Venom Anaphylaxis    Family History  Problem Relation Age of Onset  . Stroke Mother   . Hypertension Mother   . Heart disease Mother   . Deep vein thrombosis Mother     both legs, arms and back, 2 blood clots migrted to her chest area  . Diabetes Mother    Social History:  reports that she has never smoked. She has never used smokeless tobacco. She reports that she drinks alcohol. She reports that she does not  use illicit drugs.  ROS: A complete review of systems was performed.  All systems are negative except for pertinent findings as noted. ROS   Physical Exam:  Vital signs in last 24 hours: Temp:  [99.4 F (37.4 C)-103 F (39.4 C)] 102.5 F (39.2 C) (06/01 2035) Pulse Rate:  [88-110] 95 (06/01 2035) Resp:  [15-21] 19 (06/01 2035) BP: (103-122)/(56-76) 112/56 mmHg (06/01 1946) SpO2:  [98 %-100 %] 100 % (06/01 2035) Weight:  [113.5 kg (250 lb 3.6 oz)] 113.5 kg (250 lb 3.6 oz) (06/01 1557) General:  Alert and oriented, No acute distress, resting in bed Neurologic:  Grossly intact  Laboratory Data:  Results for orders placed or performed during the hospital encounter of 05/29/16 (from the past 24 hour(s))  Lipase, blood     Status: None   Collection Time: 05/29/16 10:10 AM  Result Value Ref Range   Lipase 18 11 - 51 U/L  Comprehensive metabolic panel     Status: Abnormal   Collection Time: 05/29/16 10:10 AM  Result Value Ref Range   Sodium 136 135 - 145 mmol/L   Potassium 3.6 3.5 - 5.1 mmol/L   Chloride 104 101 - 111 mmol/L   CO2 23 22 - 32 mmol/L   Glucose, Bld 152 (H) 65 - 99 mg/dL   BUN 10 6 - 20 mg/dL   Creatinine, Ser 4.090.97 0.44 - 1.00 mg/dL   Calcium 8.7 (L) 8.9 - 10.3 mg/dL   Total Protein 7.9 6.5 - 8.1 g/dL   Albumin 3.9 3.5 - 5.0 g/dL   AST 27 15 - 41 U/L   ALT 24 14 - 54 U/L   Alkaline Phosphatase 58 38 - 126 U/L   Total Bilirubin 0.7 0.3 - 1.2 mg/dL   GFR calc non Af Amer >60 >60 mL/min   GFR calc Af Amer >60 >60 mL/min   Anion gap 9 5 - 15  CBC     Status: Abnormal   Collection Time: 05/29/16 10:10 AM  Result Value Ref Range   WBC 15.5 (H) 4.0 - 10.5 K/uL   RBC 3.61 (L) 3.87 - 5.11 MIL/uL   Hemoglobin 11.1 (L) 12.0 - 15.0 g/dL   HCT 81.133.3 (L) 91.436.0 - 78.246.0 %   MCV 92.2 78.0 - 100.0 fL   MCH 30.7 26.0 - 34.0 pg   MCHC 33.3 30.0 - 36.0 g/dL   RDW 95.613.5 21.311.5 - 08.615.5 %   Platelets 191 150 - 400 K/uL  I-Stat beta hCG blood, ED     Status: None   Collection Time: 05/29/16 10:18 AM  Result Value Ref Range   I-stat hCG, quantitative <5.0 <5 mIU/mL   Comment 3          Urinalysis, Routine w reflex microscopic     Status: Abnormal   Collection Time: 05/29/16 12:44 PM  Result Value Ref Range   Color, Urine YELLOW YELLOW   APPearance CLEAR CLEAR   Specific Gravity, Urine >1.046 (H) 1.005 - 1.030   pH 6.5 5.0 - 8.0   Glucose, UA NEGATIVE NEGATIVE mg/dL   Hgb urine dipstick MODERATE (A) NEGATIVE   Bilirubin Urine NEGATIVE NEGATIVE   Ketones, ur NEGATIVE NEGATIVE mg/dL   Protein, ur 30 (A) NEGATIVE mg/dL   Nitrite NEGATIVE  NEGATIVE   Leukocytes, UA TRACE (A) NEGATIVE  Urine microscopic-add on     Status: Abnormal   Collection Time: 05/29/16 12:44 PM  Result Value Ref Range   Squamous Epithelial / LPF 0-5 (A) NONE SEEN  WBC, UA 6-30 0 - 5 WBC/hpf   RBC / HPF 6-30 0 - 5 RBC/hpf   Bacteria, UA FEW (A) NONE SEEN   Casts HYALINE CASTS (A) NEGATIVE  I-Stat CG4 Lactic Acid, ED     Status: None   Collection Time: 05/29/16  3:23 PM  Result Value Ref Range   Lactic Acid, Venous 0.67 0.5 - 2.0 mmol/L  MRSA PCR Screening     Status: None   Collection Time: 05/29/16  3:42 PM  Result Value Ref Range   MRSA by PCR NEGATIVE NEGATIVE  Reticulocytes     Status: Abnormal   Collection Time: 05/29/16  4:13 PM  Result Value Ref Range   Retic Ct Pct 1.7 0.4 - 3.1 %   RBC. 3.18 (L) 3.87 - 5.11 MIL/uL   Retic Count, Manual 54.1 19.0 - 186.0 K/uL  Procalcitonin     Status: None   Collection Time: 05/29/16  4:13 PM  Result Value Ref Range   Procalcitonin 0.37 ng/mL  Protime-INR     Status: Abnormal   Collection Time: 05/29/16  4:13 PM  Result Value Ref Range   Prothrombin Time 15.8 (H) 11.6 - 15.2 seconds   INR 1.30 0.00 - 1.49  APTT     Status: None   Collection Time: 05/29/16  4:13 PM  Result Value Ref Range   aPTT 29 24 - 37 seconds  Magnesium     Status: None   Collection Time: 05/29/16  4:13 PM  Result Value Ref Range   Magnesium 1.8 1.7 - 2.4 mg/dL  Lactic acid, plasma     Status: None   Collection Time: 05/29/16  6:00 PM  Result Value Ref Range   Lactic Acid, Venous 0.9 0.5 - 2.0 mmol/L   Recent Results (from the past 240 hour(s))  MRSA PCR Screening     Status: None   Collection Time: 05/29/16  3:42 PM  Result Value Ref Range Status   MRSA by PCR NEGATIVE NEGATIVE Final    Comment:        The GeneXpert MRSA Assay (FDA approved for NASAL specimens only), is one component of a comprehensive MRSA colonization surveillance program. It is not intended to diagnose MRSA infection nor to guide  or monitor treatment for MRSA infections.    Creatinine:  Recent Labs  05/29/16 1010  CREATININE 0.97    Impression/Assessment:  Right pyelonephritis/lobar nephronia  Plan:  Agree with radiology, no specific fluid collection or abscess to drain. Also, there are no anatomical abnormalities of the kidney or hydronephrosis.   Today is the first day she's been on antibiotics. Fever curve should improve over the next 24-72 hours. Repeat imaging is warranted in the setting of persistent clinical symptoms and laboratory abnormalities. Antibiotics per primary team. Cultures pending. Patient may need two - three weeks of abx. Patient can follow up in urology after discharge to set up imaging.   I will sign off.  Please call  on call urologist with any questions, concerns or changes in patient status.  I discussed with the patient importance of follow-up imaging as well as to return if she develops clinical symptoms (high fever, pain) again after discharge.  Abri Vacca 05/29/2016, 8:48 PM

## 2016-05-29 NOTE — ED Provider Notes (Signed)
CSN: [id number]     Arrival date & time 05/29/16  0941 History   First MD Initiated Contact with Patient 05/29/16 0957     Chief Complaint  Patient presents with  . Fever  . Abdominal Pain  . Nausea     (Consider location/radiation/quality/duration/timing/severity/associated sxs/prior Treatment) HPI Comments: 40 y.o. Female history of anemia, PCOS, asthma presents for fever and abdominal pain.  The patient states that over the last three days she has had fever up to 104.7 that she has been controlling with tylenol - last taken at 0700 this AM.  She says that she has also over this time had a decreased appetite and right sided abdominal pain in both the lower and upper abdomen.  Has a history of having her gall bladder removed.  Denies vomiting but has felt nauseous.  No diarrhea or constipation.  Patient is a 40 y.o. female presenting with fever and abdominal pain.  Fever Associated symptoms: nausea   Associated symptoms: no chest pain, no chills, no congestion, no cough, no diarrhea, no dysuria, no headaches, no rash, no rhinorrhea and no vomiting   Abdominal Pain Associated symptoms: fatigue, fever and nausea   Associated symptoms: no chest pain, no chills, no cough, no diarrhea, no dysuria, no hematuria, no shortness of breath and no vomiting     Past Medical History  Diagnosis Date  . Anemia   . Anxiety   . PCOS (polycystic ovarian syndrome)   . History of exercise stress test dr Eden Emms    11-11-2013--  normal/  good exercise capacity, no chest pain, no ischemia  . History of abnormal cervical Pap smear   . History of pre-eclampsia 11/ 2014    hypertension and postpartum pulmonary edema--  resolved  . Bilateral carpal tunnel syndrome   . Mild asthma   . IBS (irritable bowel syndrome)    Past Surgical History  Procedure Laterality Date  . Cholecystectomy N/A 10/27/2014    Procedure: LAPAROSCOPIC CHOLECYSTECTOMY WITH INTRAOPERATIVE CHOLANGIOGRAM;  Surgeon: Atilano Ina, MD;   Location: [facility];  Service: General;  Laterality: N/A;  . I & d  left vulvar hematoma, postpartum  07-05-2009  . Transthoracic echocardiogram  10-28-2013    normal LVF,  ef 60-65%,  mild AR , MR and , TR  . Carpal tunnel release Right 08/02/2015    Procedure: RIGHT HAND CARPAL TUNNEL RELEASE;  Surgeon: Bradly Bienenstock, MD;  Location: [facility];  Service: Orthopedics;  Laterality: Right;   Family History  Problem Relation Age of Onset  . Stroke Mother   . Hypertension Mother   . Heart disease Mother   . Deep vein thrombosis Mother     both legs, arms and back, 2 blood clots migrted to her chest area  . Diabetes Mother    Social History  Substance Use Topics  . Smoking status: Never Smoker   . Smokeless tobacco: Never Used  . Alcohol Use: 0.0 oz/week    0 Standard drinks or equivalent per week     Comment: occasional   OB History    Gravida Para Term Preterm AB TAB SAB Ectopic Multiple Living   Review of Systems  Constitutional: Positive for fever, appetite change and fatigue. Negative for chills.  HENT: Negative for congestion, postnasal drip and rhinorrhea.   Eyes: Negative for visual disturbance.  Respiratory: Negative for cough, chest tightness and shortness of breath.  Cardiovascular: Negative for chest pain and palpitations.  Gastrointestinal: Positive for nausea and abdominal pain. Negative for vomiting and diarrhea.  Genitourinary: Positive for urgency. Negative for dysuria, frequency, hematuria and flank pain.  Musculoskeletal: Negative for back pain and joint swelling.  Skin: Negative for rash.  Neurological: Negative for dizziness, weakness and headaches.  Hematological: Does not bruise/bleed easily.      Allergies  Bee venom  Home Medications   Prior to Admission medications   Medication Sig Start Date End Date Taking? Authorizing Provider  acetaminophen (TYLENOL) 500 MG tablet Take 1,000 mg by mouth every 6 (six) hours as  needed for fever or headache.   Yes Historical Provider, MD  albuterol (PROVENTIL HFA;VENTOLIN HFA) 108 (90 BASE) MCG/ACT inhaler Inhale 2 puffs into the lungs every 6 (six) hours as needed for wheezing. 12/08/12  Yes Thao P Le, DO  Biotin 16109 MCG TBDP Take 10,000 mcg by mouth daily.   Yes Historical Provider, MD  Calcium Carb-Cholecalciferol (CALCIUM 600 + D PO) Take 1 tablet by mouth daily.   Yes Historical Provider, MD  Melatonin 10 MG TABS Take 10 mg by mouth at bedtime as needed (sleep).   Yes Historical Provider, MD  Multiple Vitamins-Minerals (MULTIVITAMIN WITH MINERALS) tablet Take 1 tablet by mouth daily.   Yes Historical Provider, MD  EPINEPHrine (EPI-PEN) 0.3 mg/0.3 mL DEVI Inject 0.3 mLs (0.3 mg total) into the muscle once. Patient not taking: Reported on 05/29/2016 04/28/12   Rickard Patience, PA-C   BP 103/57 mmHg  Pulse 96  Temp(Src) 100.3 F (37.9 C) (Oral)  Resp 16  SpO2 98%  LMP 05/01/2016 Physical Exam  Constitutional: She is oriented to person, place, and time. She appears well-developed and well-nourished. No distress.  HENT:  Head: Normocephalic and atraumatic.  Right Ear: External ear normal.  Left Ear: External ear normal.  Nose: Nose normal.  Mouth/Throat: Oropharynx is clear and moist. Mucous membranes are dry. No oropharyngeal exudate.  Eyes: EOM are normal. Pupils are equal, round, and reactive to light.  Neck: Normal range of motion. Neck supple.  Cardiovascular: Normal rate, regular rhythm, normal heart sounds and intact distal pulses.   No murmur heard. Pulmonary/Chest: Effort normal. No respiratory distress. She has no wheezes. She has no rales.  Abdominal: Soft. She exhibits no distension. There is tenderness (mild to moderate) in the right upper quadrant, right lower quadrant and suprapubic area. There is no CVA tenderness.  Musculoskeletal: Normal range of motion. She exhibits no edema or tenderness.  Neurological: She is alert and oriented to person,  place, and time.  Skin: Skin is warm and dry. No rash noted. She is not diaphoretic.  Vitals reviewed.   ED Course  Procedures (including critical care time) Labs Review Labs Reviewed  COMPREHENSIVE METABOLIC PANEL - Abnormal; Notable for the following:    Glucose, Bld 152 (*)    Calcium 8.7 (*)    All other components within normal limits  CBC - Abnormal; Notable for the following:    WBC 15.5 (*)    RBC 3.61 (*)    Hemoglobin 11.1 (*)    HCT 33.3 (*)    All other components within normal limits  URINALYSIS, ROUTINE W REFLEX MICROSCOPIC (NOT AT [facility]) - Abnormal; Notable for the following:    Specific Gravity, Urine >1.046 (*)    Hgb urine dipstick MODERATE (*)    Protein, ur 30 (*)    Leukocytes, UA TRACE (*)    All other components within normal  limits  URINE MICROSCOPIC-ADD ON - Abnormal; Notable for the following:    Squamous Epithelial / LPF 0-5 (*)    Bacteria, UA FEW (*)    Casts HYALINE CASTS (*)    All other components within normal limits  URINE CULTURE  CULTURE, BLOOD (ROUTINE X 2)  CULTURE, BLOOD (ROUTINE X 2)  LIPASE, BLOOD  I-STAT BETA HCG BLOOD, ED ([facility], [facility], [facility] ONLY)  I-STAT CG4 LACTIC ACID, ED    Imaging Review Ct Abdomen Pelvis W Contrast  05/29/2016  CLINICAL DATA:  Right-sided pain and fever. EXAM: CT ABDOMEN AND PELVIS WITH CONTRAST TECHNIQUE: Multidetector CT imaging of the abdomen and pelvis was performed using the standard protocol following bolus administration of intravenous contrast. CONTRAST:  100mL ISOVUE-300 IOPAMIDOL (ISOVUE-300) INJECTION 61% COMPARISON:  Plain films of 01/05/2015. Most recent CT of 07/20/2009. FINDINGS: Lower chest: 4 mm left lower lobe pulmonary nodule on image 1/ series 3 was present on the prior and can be considered benign. Normal heart size without pericardial or pleural effusion. Hepatobiliary: Mild hepatic steatosis suspected. More focal hypoattenuation within the anterior aspect of segment 4 including on image 27/series 2  is favored to be related to focal steatosis/perfusion anomaly. Cholecystectomy, without biliary ductal dilatation. Pancreas: Normal, without mass or ductal dilatation. Spleen: Normal in size, without focal abnormality. Adrenals/Urinary Tract: Normal adrenal glands. 4 mm right renal collecting system calculus. Left renal too small to characterize lesions. Heterogeneous enhancement with decreased corticomedullary differentiation identified within the inter and lower pole right kidney. Mild right perinephric edema/interstitial thickening. Somewhat more focal masslike area of hypoattenuation involves the interpolar right kidney on image 44/series 2. This measures on the order of 2.3 x 2.7 cm on image 22/series 7. No hydronephrosis. Normal urinary bladder. Stomach/Bowel: Normal stomach, without wall thickening. Normal colon, appendix, and terminal ileum. Normal small bowel. Vascular/Lymphatic: Significantly age advanced but mild aortic and branch vessel atherosclerosis. Patent right renal vein. No abdominopelvic adenopathy. Reproductive: Normal uterus and adnexa. Other: No significant free fluid. Musculoskeletal: Mild disc bulges at L4-5 and L5-S1. IMPRESSION: 1. Findings consistent with right-sided pyelonephritis. A somewhat more focal area of hypoattenuation medially is favored to represent lobar nephronia. recommend appropriate antibiotic therapy and consideration imaging follow-up with pre and post contrast abdominal MRI at 2-3 months (to confirm resolution and exclude a highly unlikely concurrent neoplasm). MRI favored over CT secondary lack of radiation in this young patient. 2. Segment 4 area of anterior hepatic lobe hypoattenuation is favored to represent focal steatosis and/or perfusion anomaly. This could also be confirmed on the above follow-up MRI. 3. Right nephrolithiasis. 4. Age advanced atherosclerosis. These results will be called to the ordering clinician or representative by the Radiologist Assistant,  and communication documented in the PACS or zVision Dashboard. Electronically Signed   By: Jeronimo GreavesKyle  Talbot M.D.   On: 05/29/2016 11:58   I have personally reviewed and evaluated these images and lab results as part of my medical decision-making.   EKG Interpretation None      MDM  Patient was seen and evaluated at bedside. Patient febrile at home and did take Tylenol this morning. Patient tachycardic on arrival. Initial blood culture obtained with initial labs. Patient started on IV fluids. Laboratory studies revealed a leukocytosis.  Urine consistent with infection. CT consistent with pyelonephritis with possible small abscess intra-renally. Patient was started on IV Rocephin. Initially patient was saying that she would not be able to stay in the hospital because of childcare issues. She was able to work this  out with her husband and so was then in agreement to stay. Since she was now agreeable to stay and was septic from her pyelonephritis could sepsis was called. Patient did Artie have 3 L of IV fluids ordered before this and had already been given her initial dose of antibiotics. Case was discussed with Dr. Janee Morn from [facility] who agreed with admission. Patient was admitted to the stepdown unit under his care. Case was also discussed with the urologist on call who said they would review the CT and patient's case and would give further recommendations afterwards. Patient was updated on all results and plan of care. Patient was admitted in stable condition. Final diagnoses:  Pyelonephritis  Sepsis, due to unspecified organism (HCC)    1. Sepsis 2. Pyelonephritis    Leta Baptist, MD 05/29/16 1501

## 2016-05-29 NOTE — Progress Notes (Signed)
Pharmacy Antibiotic Note  Erica Lang is a 40 y.o. female admitted on 05/29/2016 with sepsis from pyelonephritis.  Pharmacy has been consulted for ceftriaxone dosing.  Plan:  Ceftriaxone 2g IV q24h  No further dosing adjustments needed, Pharmacy will sign-off  Height: 5\' 10"  (177.8 cm) Weight: 250 lb 3.6 oz (113.5 kg) IBW/kg (Calculated) : 68.5  Temp (24hrs), Avg:100.4 F (38 C), Min:99.4 F (37.4 C), Max:101.4 F (38.6 C)   Recent Labs Lab 05/29/16 1010 05/29/16 1523  WBC 15.5*  --   CREATININE 0.97  --   LATICACIDVEN  --  0.67    Estimated Creatinine Clearance: 103.2 mL/min (by C-G formula based on Cr of 0.97).    Allergies  Allergen Reactions  . Bee Venom Anaphylaxis    Antimicrobials this admission: 6/1 >> Ceftriaxone >>  Dose adjustments this admission: ---  Microbiology results: 6/1 BCx: sent 6/1 UCx: sent  6/1 MRSA PCR: sent  Thank you for allowing pharmacy to be a part of this patient's care.  Loralee PacasErin Braylen Denunzio, PharmD, BCPS Pager: [phone] 05/29/2016 4:25 PM

## 2016-05-29 NOTE — ED Notes (Signed)
Pt states high fever managed with tylenol at home over last three days. Also c/o RLQ abdominal pain while having a bowel movement, and that her urine has smelled "funny." C/o nausea, denies vomiting/diarrhea. Took extra strength tylenol at 0700 today

## 2016-05-29 NOTE — ED Notes (Signed)
PT unable to give urine sample at this time.   

## 2016-05-30 ENCOUNTER — Inpatient Hospital Stay (HOSPITAL_COMMUNITY): Payer: BLUE CROSS/BLUE SHIELD

## 2016-05-30 DIAGNOSIS — E669 Obesity, unspecified: Secondary | ICD-10-CM

## 2016-05-30 DIAGNOSIS — N12 Tubulo-interstitial nephritis, not specified as acute or chronic: Secondary | ICD-10-CM

## 2016-05-30 DIAGNOSIS — E282 Polycystic ovarian syndrome: Secondary | ICD-10-CM | POA: Diagnosis not present

## 2016-05-30 DIAGNOSIS — R51 Headache: Secondary | ICD-10-CM | POA: Diagnosis not present

## 2016-05-30 DIAGNOSIS — N390 Urinary tract infection, site not specified: Secondary | ICD-10-CM

## 2016-05-30 LAB — COMPREHENSIVE METABOLIC PANEL
ALBUMIN: 3 g/dL — AB (ref 3.5–5.0)
ALT: 32 U/L (ref 14–54)
ANION GAP: 5 (ref 5–15)
AST: 34 U/L (ref 15–41)
Alkaline Phosphatase: 45 U/L (ref 38–126)
BUN: 9 mg/dL (ref 6–20)
CALCIUM: 7.4 mg/dL — AB (ref 8.9–10.3)
CHLORIDE: 109 mmol/L (ref 101–111)
CO2: 20 mmol/L — AB (ref 22–32)
Creatinine, Ser: 0.8 mg/dL (ref 0.44–1.00)
GFR calc non Af Amer: >60 mL/min (ref >60)
GLUCOSE: 92 mg/dL (ref 65–99)
POTASSIUM: 3 mmol/L — AB (ref 3.5–5.1)
SODIUM: 134 mmol/L — AB (ref 135–145)
Total Bilirubin: 0.7 mg/dL (ref 0.3–1.2)
Total Protein: 6.3 g/dL — ABNORMAL LOW (ref 6.5–8.1)

## 2016-05-30 LAB — CBC WITH DIFFERENTIAL/PLATELET
BASOS ABS: 0 10*3/uL (ref 0.0–0.1)
Basophils Relative: 0 %
EOS ABS: 0 10*3/uL (ref 0.0–0.7)
Eosinophils Relative: 0 %
HCT: 29.4 % — ABNORMAL LOW (ref 36.0–46.0)
Hemoglobin: 9.9 g/dL — ABNORMAL LOW (ref 12.0–15.0)
LYMPHS PCT: 18 %
Lymphs Abs: 1.7 10*3/uL (ref 0.7–4.0)
MCH: 31 pg (ref 26.0–34.0)
MCHC: 33.7 g/dL (ref 30.0–36.0)
MCV: 92.2 fL (ref 78.0–100.0)
Monocytes Absolute: 1.1 10*3/uL — ABNORMAL HIGH (ref 0.1–1.0)
Monocytes Relative: 11 %
NEUTROS ABS: 6.9 10*3/uL (ref 1.7–7.7)
NEUTROS PCT: 71 %
PLATELETS: 163 10*3/uL (ref 150–400)
RBC: 3.19 MIL/uL — AB (ref 3.87–5.11)
RDW: 13.5 % (ref 11.5–15.5)
WBC: 9.7 10*3/uL (ref 4.0–10.5)

## 2016-05-30 LAB — GLUCOSE, CAPILLARY: Glucose-Capillary: 111 mg/dL — ABNORMAL HIGH (ref 65–99)

## 2016-05-30 LAB — APTT: APTT: 34 s (ref 24–37)

## 2016-05-30 LAB — PROTIME-INR
INR: 1.34 (ref 0.00–1.49)
Prothrombin Time: 16.2 seconds — ABNORMAL HIGH (ref 11.6–15.2)

## 2016-05-30 MED ORDER — POTASSIUM CHLORIDE CRYS ER 20 MEQ PO TBCR
40.0000 meq | EXTENDED_RELEASE_TABLET | Freq: Four times a day (QID) | ORAL | Status: AC
  Administered 2016-05-30 (×2): 40 meq via ORAL
  Filled 2016-05-30 (×2): qty 2

## 2016-05-30 MED ORDER — IBUPROFEN 200 MG PO TABS
600.0000 mg | ORAL_TABLET | Freq: Four times a day (QID) | ORAL | Status: DC | PRN
  Administered 2016-05-30 – 2016-05-31 (×3): 600 mg via ORAL
  Filled 2016-05-30 (×3): qty 3

## 2016-05-30 MED ORDER — MORPHINE SULFATE (PF) 2 MG/ML IV SOLN: 2.0000 mg | INTRAVENOUS | Status: DC | PRN

## 2016-05-30 MED ORDER — SODIUM CHLORIDE 0.9 % IV SOLN: INTRAVENOUS | Status: DC

## 2016-05-30 NOTE — Care Management Note (Signed)
Case Management Note  Patient Details  Name: Erica Lang MRN: [id number] Date of Birth: 09-16-1976  Subjective/Objective:         pyelonephritis via films           Action/Plan:Date:  May 30, 2016 Chart reviewed for concurrent status and case management needs. Will continue to follow the patient for changes and needs: Expected discharge date: [id number] Erica Lang, BSN, [place], [place]   [phone]   Expected Discharge Date:   (UNKNOWN)               Expected Discharge Plan:  Home/Self Care  In-House Referral:  NA  Discharge planning Services  CM Consult  Post Acute Care Choice:  NA Choice offered to:  NA  DME Arranged:  N/A DME Agency:  NA  HH Arranged:  NA HH Agency:  NA  Status of Service:  In process, will continue to follow  Medicare Important Message Given:    Date Medicare IM Given:    Medicare IM give by:    Date Additional Medicare IM Given:    Additional Medicare Important Message give by:     If discussed at Long Length of Stay Meetings, dates discussed:    Additional Comments:  Erica Lang, Erica Lang [place], RN 05/30/2016, 10:01 AM

## 2016-05-30 NOTE — Progress Notes (Signed)
PROGRESS NOTE                                                                                                                                                                                                             Patient Demographics:    Erica Lang, is a 40 y.o. female, DOB - [birth year], AVW:[id number]  Admit date - 05/29/2016   Admitting Physician Rodolph Bong, MD  Outpatient Primary MD for the patient is Farris Has, MD  LOS - 1  Chief Complaint  Patient presents with  . Fever  . Abdominal Pain  . Nausea       Brief Narrative   Erica Lang is a 40 y.o. female with medical history significant of polycystic ovarian syndrome, anxiety, anemia presenting to the ED with a three-day history of high fevers with temperatures as high as 104, headache, facial pain, lower abdominal pain and right lower back pain with associated strong urine odor or, chills, palpitations, nausea, emesis. Patient also endorses some constipation. Patient denies any chest pain, no shortness of breath, no melena, no hematemesis, no hematochezia, no cough, no visual changes, no asymmetric weakness or numbness.  ED Course: Patient was seen in the ED, compressive metabolic profile unremarkable. CBC had a white count of 15.5 hemoglobin of 11.1. Chest x-ray no acute cardiopulmonary disease. Urinalysis had moderate hemoglobin trace leukocytes, negative nitrite, 6-30 WBCs, specific gravity greater than 1.046. CT abdomen and pelvis which was done showed findings consistent with right-sided pyelonephritis. Somewhat more focal area of hypoattenuation medially is favored to represent lobar nephronia. Segment 4 area of anterior hepatic lobe hypoattenuation is favored to represent focal steatosis and a perfusion anomaly. Right nephrolithiasis. Age advanced atherosclerosis. Patient noted to have a temperature of 103 in the emergency room with heart rates of  110 and borderline blood pressure. Patient was given IV Rocephin. [facility] were called to admit the patient for further evaluation and management.    Subjective:    Fernando Torry today has, Mild generalized headache and right-sided flank pain,  No chest pain, No Nausea, No new weakness tingling or numbness, No Cough - SOB.     Assessment  & Plan :     1. Right-sided pyelonephritis. Is not septic, lactate progress at home in stable, continue empiric Rocephin and follow cultures. Seen by urology will need  to follow with them closely for repeat imaging within 1-2 weeks after discharge. Clinically better. Continue gentle hydration and supportive care for now.   2. Mild generalized headache. Due to severe infection and fevers. CT head unremarkable, no neck stiffness, no focal deficits. Ibuprofen and monitor.   3.Obesity - follow with PCP.    Code Status :  Full  Family Communication  :  None present  Disposition Plan  :  MedSurg bed  Consults  :  Urology  Procedures  :    CT abdomen and pelvis. Right-sided pyelonephritis  CT head unremarkable.   DVT Prophylaxis  :  Lovenox   Lab Results  Component Value Date   PLT 163 05/30/2016    Inpatient Medications  Scheduled Meds: . cefTRIAXone (ROCEPHIN)  IV  2 g Intravenous Once  . cefTRIAXone (ROCEPHIN)  IV  2 g Intravenous Q24H  . enoxaparin (LOVENOX) injection  40 mg Subcutaneous Q24H  . multivitamin with minerals  1 tablet Oral Daily  . potassium chloride  40 mEq Oral Q6H  . senna  1 tablet Oral BID  . sodium chloride flush  3 mL Intravenous Q12H   Continuous Infusions: . sodium chloride 75 mL/hr at 05/30/16 0730   PRN Meds:.acetaminophen **OR** [DISCONTINUED] acetaminophen, albuterol, albuterol, ibuprofen, morphine injection, [DISCONTINUED] ondansetron **OR** ondansetron (ZOFRAN) IV, oxyCODONE, senna-docusate, sodium phosphate, sorbitol  Antibiotics  :    Anti-infectives    Start     Dose/Rate Route  Frequency Ordered Stop   05/30/16 1800  cefTRIAXone (ROCEPHIN) 2 g in dextrose 5 % 50 mL IVPB     2 g 100 mL/hr over 30 Minutes Intravenous Every 24 hours 05/29/16 1625     05/30/16 1200  cefTRIAXone (ROCEPHIN) 2 g in dextrose 5 % 50 mL IVPB     2 g 100 mL/hr over 30 Minutes Intravenous  Once 05/29/16 1553     05/29/16 1630  cefTRIAXone (ROCEPHIN) 1 g in dextrose 5 % 50 mL IVPB     1 g 100 mL/hr over 30 Minutes Intravenous NOW 05/29/16 1625 05/29/16 1715   05/29/16 1215  cefTRIAXone (ROCEPHIN) 1 g in dextrose 5 % 50 mL IVPB     1 g 100 mL/hr over 30 Minutes Intravenous  Once 05/29/16 1212 05/29/16 1439         Objective:   Filed Vitals:   05/30/16 0000 05/30/16 0200 05/30/16 0500 05/30/16 0608  BP: 128/61 129/68  125/65  Pulse: 105 95  96  Temp:   101.3 F (38.5 C)   TempSrc:   Oral   Resp: 22 23  17   Height:      Weight:      SpO2: 97% 99%  98%    Wt Readings from Last 3 Encounters:  05/29/16 113.5 kg (250 lb 3.6 oz)  08/02/15 111.585 kg (246 lb)  06/07/15 110.224 kg (243 lb)     Intake/Output Summary (Last 24 hours) at 05/30/16 0858 Last data filed at 05/30/16 0636  Gross per 24 hour  Intake 1933.33 ml  Output    750 ml  Net 1183.33 ml     Physical Exam  Awake Alert, Oriented X 3, No new F.N deficits, Normal affect [place].AT,PERRAL Supple Neck,No JVD, No cervical lymphadenopathy appriciated.  Symmetrical Chest wall movement, Good air movement bilaterally, CTAB RRR,No Gallops,Rubs or new Murmurs, No Parasternal Heave +ve B.Sounds, Abd Soft, No tenderness, No organomegaly appriciated, No rebound - guarding or rigidity. No Cyanosis, Clubbing or edema, No new Rash or  bruise      Data Review:    CBC  Recent Labs Lab 05/29/16 1010 05/30/16 0309  WBC 15.5* 9.7  HGB 11.1* 9.9*  HCT 33.3* 29.4*  PLT 191 163  MCV 92.2 92.2  MCH 30.7 31.0  MCHC 33.3 33.7  RDW 13.5 13.5  LYMPHSABS  --  1.7  MONOABS  --  1.1*  EOSABS  --  0.0  BASOSABS  --  0.0     Chemistries   Recent Labs Lab 05/29/16 1010 05/29/16 1613 05/30/16 0309  NA 136  --  134*  K 3.6  --  3.0*  CL 104  --  109  CO2 23  --  20*  GLUCOSE 152*  --  92  BUN 10  --  9  CREATININE 0.97  --  0.80  CALCIUM 8.7*  --  7.4*  MG  --  1.8  --   AST 27  --  34  ALT 24  --  32  ALKPHOS 58  --  45  BILITOT 0.7  --  0.7   ------------------------------------------------------------------------------------------------------------------ No results for input(s): CHOL, HDL, LDLCALC, TRIG, CHOLHDL, LDLDIRECT in the last 72 hours.  No results found for: HGBA1C ------------------------------------------------------------------------------------------------------------------ No results for input(s): TSH, T4TOTAL, T3FREE, THYROIDAB in the last 72 hours.  Invalid input(s): FREET3 ------------------------------------------------------------------------------------------------------------------  Recent Labs  05/29/16 1613  VITAMINB12 298  FOLATE 14.8  FERRITIN 144  TIBC 309  IRON 11*  RETICCTPCT 1.7    Coagulation profile  Recent Labs Lab 05/29/16 1613 05/30/16 0309  INR 1.30 1.34    No results for input(s): DDIMER in the last 72 hours.  Cardiac Enzymes No results for input(s): CKMB, TROPONINI, MYOGLOBIN in the last 168 hours.  Invalid input(s): CK ------------------------------------------------------------------------------------------------------------------ No results found for: BNP  Micro Results Recent Results (from the past 240 hour(s))  MRSA PCR Screening     Status: None   Collection Time: 05/29/16  3:42 PM  Result Value Ref Range Status   MRSA by PCR NEGATIVE NEGATIVE Final    Comment:        The GeneXpert MRSA Assay (FDA approved for NASAL specimens only), is one component of a comprehensive MRSA colonization surveillance program. It is not intended to diagnose MRSA infection nor to guide or monitor treatment for MRSA infections.      Radiology Reports Ct Head Wo Contrast  05/30/2016  CLINICAL DATA:  Occipital headache for 3-4 days. EXAM: CT HEAD WITHOUT CONTRAST TECHNIQUE: Contiguous axial images were obtained from the base of the skull through the vertex without intravenous contrast. COMPARISON:  10/28/2013 FINDINGS: The brain demonstrates no evidence of hemorrhage, infarction, edema, mass effect, extra-axial fluid collection, hydrocephalus or mass lesion. The skull is unremarkable. IMPRESSION: Normal head CT. Electronically Signed   By: Irish Lack M.D.   On: 05/30/2016 08:05   Ct Abdomen Pelvis W Contrast  05/29/2016  CLINICAL DATA:  Right-sided pain and fever. EXAM: CT ABDOMEN AND PELVIS WITH CONTRAST TECHNIQUE: Multidetector CT imaging of the abdomen and pelvis was performed using the standard protocol following bolus administration of intravenous contrast. CONTRAST:  ISOVUE-300 IOPAMIDOL (ISOVUE-300) INJECTION 61% COMPARISON:  Plain films of 01/05/2015. Most recent CT of 07/20/2009. FINDINGS: Lower chest: 4 mm left lower lobe pulmonary nodule on image 1/ series 3 was present on the prior and can be considered benign. Normal heart size without pericardial or pleural effusion. Hepatobiliary: Mild hepatic steatosis suspected. More focal hypoattenuation within the anterior aspect of segment 4 including on  image 27/series 2 is favored to be related to focal steatosis/perfusion anomaly. Cholecystectomy, without biliary ductal dilatation. Pancreas: Normal, without mass or ductal dilatation. Spleen: Normal in size, without focal abnormality. Adrenals/Urinary Tract: Normal adrenal glands. 4 mm right renal collecting system calculus. Left renal too small to characterize lesions. Heterogeneous enhancement with decreased corticomedullary differentiation identified within the inter and lower pole right kidney. Mild right perinephric edema/interstitial thickening. Somewhat more focal masslike area of hypoattenuation involves the  interpolar right kidney on image 44/series 2. This measures on the order of 2.3 x 2.7 cm on image 22/series 7. No hydronephrosis. Normal urinary bladder. Stomach/Bowel: Normal stomach, without wall thickening. Normal colon, appendix, and terminal ileum. Normal small bowel. Vascular/Lymphatic: Significantly age advanced but mild aortic and branch vessel atherosclerosis. Patent right renal vein. No abdominopelvic adenopathy. Reproductive: Normal uterus and adnexa. Other: No significant free fluid. Musculoskeletal: Mild disc bulges at L4-5 and L5-S1. IMPRESSION: 1. Findings consistent with right-sided pyelonephritis. A somewhat more focal area of hypoattenuation medially is favored to represent lobar nephronia. recommend appropriate antibiotic therapy and consideration imaging follow-up with pre and post contrast abdominal MRI at 2-3 months (to confirm resolution and exclude a highly unlikely concurrent neoplasm). MRI favored over CT secondary lack of radiation in this young patient. 2. Segment 4 area of anterior hepatic lobe hypoattenuation is favored to represent focal steatosis and/or perfusion anomaly. This could also be confirmed on the above follow-up MRI. 3. Right nephrolithiasis. 4. Age advanced atherosclerosis. These results will be called to the ordering clinician or representative by the Radiologist Assistant, and communication documented in the PACS or zVision Dashboard. Electronically Signed   By: Jeronimo Greaves M.D.   On: 05/29/2016 11:58   Dg Chest Port 1 View  05/29/2016  CLINICAL DATA:  Fever. EXAM: PORTABLE CHEST 1 VIEW COMPARISON:  January 05, 2015. FINDINGS: The heart size and mediastinal contours are within normal limits. Both lungs are clear. No pneumothorax or pleural effusion is noted. The visualized skeletal structures are unremarkable. IMPRESSION: No active disease. Electronically Signed   By: Lupita Raider, M.D.   On: 05/29/2016 16:20    Time Spent in minutes  30   Orra Nolde K M.D  on 05/30/2016 at 8:58 AM  Between 7am to 7pm - Pager - [phone]  After 7pm go to [email or web address] - password [facility]  [facility] -  Office  [phone]

## 2016-05-31 DIAGNOSIS — N12 Tubulo-interstitial nephritis, not specified as acute or chronic: Secondary | ICD-10-CM | POA: Diagnosis not present

## 2016-05-31 DIAGNOSIS — E669 Obesity, unspecified: Secondary | ICD-10-CM | POA: Diagnosis not present

## 2016-05-31 DIAGNOSIS — E282 Polycystic ovarian syndrome: Secondary | ICD-10-CM | POA: Diagnosis not present

## 2016-05-31 DIAGNOSIS — R51 Headache: Secondary | ICD-10-CM | POA: Diagnosis not present

## 2016-05-31 LAB — BASIC METABOLIC PANEL
ANION GAP: 9 (ref 5–15)
BUN: 8 mg/dL (ref 6–20)
CALCIUM: 8 mg/dL — AB (ref 8.9–10.3)
CO2: 23 mmol/L (ref 22–32)
Chloride: 105 mmol/L (ref 101–111)
Creatinine, Ser: 0.57 mg/dL (ref 0.44–1.00)
GLUCOSE: 98 mg/dL (ref 65–99)
Potassium: 3.8 mmol/L (ref 3.5–5.1)
SODIUM: 137 mmol/L (ref 135–145)

## 2016-05-31 LAB — MAGNESIUM: MAGNESIUM: 1.9 mg/dL (ref 1.7–2.4)

## 2016-05-31 LAB — URINE CULTURE: Culture: >=100000 — AB

## 2016-05-31 LAB — CBC
HCT: 27.4 % — ABNORMAL LOW (ref 36.0–46.0)
Hemoglobin: 9.2 g/dL — ABNORMAL LOW (ref 12.0–15.0)
MCH: 30.9 pg (ref 26.0–34.0)
MCHC: 33.6 g/dL (ref 30.0–36.0)
MCV: 91.9 fL (ref 78.0–100.0)
PLATELETS: 182 10*3/uL (ref 150–400)
RBC: 2.98 MIL/uL — AB (ref 3.87–5.11)
RDW: 13.6 % (ref 11.5–15.5)
WBC: 5.9 10*3/uL (ref 4.0–10.5)

## 2016-05-31 LAB — GLUCOSE, CAPILLARY: GLUCOSE-CAPILLARY: 96 mg/dL (ref 65–99)

## 2016-05-31 MED ORDER — CEFPODOXIME PROXETIL 200 MG PO TABS: 200.0000 mg | ORAL_TABLET | Freq: Two times a day (BID) | ORAL | Status: DC

## 2016-05-31 NOTE — Discharge Summary (Signed)
Erica Lang, is a 40 y.o. female  DOB 1976/11/29  MRN [id number].  Admission date:  05/29/2016  Admitting Physician  Rodolph Bonganiel Thompson V, MD  Discharge Date:  05/31/2016   Primary MD  Farris HasMORROW, AARON, MD  Recommendations for primary care physician for things to follow:   Check CBC, CMP in a week. She needs to follow with urology for repeat imaging of her kidneys within 1-2 weeks.   Admission Diagnosis  Pyelonephritis [N12] Sepsis, due to unspecified organism [name] [A41.9]   Discharge Diagnosis  Pyelonephritis [N12] Sepsis, due to unspecified organism [name] [A41.9]    Principal Problem:   Sepsis (HCC) Active Problems:   Persistent headaches   Anxiety   Hypertension   Obesity   PCOS (polycystic ovarian syndrome)   Pyelonephritis   Acute UTI   Headache      Past Medical History  Diagnosis Date  . Anemia   . Anxiety   . PCOS (polycystic ovarian syndrome)   . History of exercise stress test dr Eden Emmsnishan    11-11-2013--  normal/  good exercise capacity, no chest pain, no ischemia  . History of abnormal cervical Pap smear   . History of pre-eclampsia 11/ 2014    hypertension and postpartum pulmonary edema--  resolved  . Bilateral carpal tunnel syndrome   . Mild asthma   . IBS (irritable bowel syndrome)     Past Surgical History  Procedure Laterality Date  . Cholecystectomy N/A 10/27/2014    Procedure: LAPAROSCOPIC CHOLECYSTECTOMY WITH INTRAOPERATIVE CHOLANGIOGRAM;  Surgeon: Atilano InaEric M Wilson, MD;  Location: [facility];  Service: General;  Laterality: N/A;  . I & d  left vulvar hematoma, postpartum  07-05-2009  . Transthoracic echocardiogram  10-28-2013    normal LVF,  ef 60-65%,  mild AR , MR and , TR  . Carpal tunnel release Right 08/02/2015    Procedure: RIGHT HAND CARPAL TUNNEL RELEASE;  Surgeon: Bradly BienenstockFred Ortmann,  MD;  [facility];  Service: Orthopedics;  Laterality: Right;       HPI  from the history and physical done on the day of admission:    Erica Lang is a 40 y.o. female with medical history significant of polycystic ovarian syndrome, anxiety, anemia presenting to the ED with a three-day history of high fevers with temperatures as high as 104, headache, facial pain, lower abdominal pain and right lower back pain with associated strong urine odor or, chills, palpitations, nausea, emesis. Patient also endorses some constipation. Patient denies any chest pain, no shortness of breath, no melena, no hematemesis, no hematochezia, no cough, no visual changes, no asymmetric weakness or numbness.  ED Course: Patient was seen in the ED, compressive metabolic profile unremarkable. CBC had a white count of 15.5 hemoglobin of 11.1. Chest x-ray no acute cardiopulmonary disease. Urinalysis had moderate hemoglobin trace leukocytes, negative nitrite, 6-30 WBCs, specific gravity greater than 1.046. CT abdomen and pelvis which was done showed findings consistent with right-sided pyelonephritis. Somewhat more focal area of hypoattenuation medially is favored  to represent lobar nephronia. Segment 4 area of anterior hepatic lobe hypoattenuation is favored to represent focal steatosis and a perfusion anomaly. Right nephrolithiasis. Age advanced atherosclerosis. Patient noted to have a temperature of 103 in the emergency room with heart rates of 110 and borderline blood pressure. Patient was given IV Rocephin. [facility] were called to admit the patient for further evaluation and management.     Hospital Course:     1. Right-sided pyelonephritis. Is not septic, lactate progress at home in stable, Responded very well IV Rocephin, now back to baseline and afebrile, leukocytosis resolved, urine culture growing pansensitive Enterobacter, will be placed on 12 more days of oral Vantin to complete  total of 14 days. Request PCP to check CBC and CMP within a week. Needs outpatient urology follow-up for repeat imaging of her kidneys as initial CT scan of her kidneys was abnormal.   2. Mild generalized headache. Due to severe infection and fevers. CT head unremarkable, and a completely resolved after a single dose ibuprofen.   3.Obesity - follow with PCP.       Follow UP  Follow-up Information    Follow up with Farris Has, MD. Schedule an appointment as soon as possible for a visit in 1 week.   Specialty:  Family Medicine   Contact information:   [address] [place] [address] [phone]       Follow up with [facility], MATTHEW, MD. Schedule an appointment as soon as possible for a visit in 1 week.   Specialty:  Urology   Contact information:   [address] [place] [address] [phone]        Consults obtained - Urology  Discharge Condition: Stable  Diet and Activity recommendation: See Discharge Instructions below  Discharge Instructions           Discharge Instructions    Diet - low sodium heart healthy    Complete by:  As directed      Discharge instructions    Complete by:  As directed   Follow with Primary MD Farris Has, MD in 7 days   Get CBC, CMP, 2 view Chest X ray checked  by Primary MD next visit.    Activity: As tolerated with Full fall precautions use walker/cane & assistance as needed   Disposition Home    Diet:   Heart Healthy .  For Heart failure patients - Check your Weight same time everyday, if you gain over 2 pounds, or you develop in leg swelling, experience more shortness of breath or chest pain, call your Primary MD immediately. Follow Cardiac Low Salt Diet and 1.5 lit/day fluid restriction.   On your next visit with your primary care physician please Get Medicines reviewed and adjusted.   Please request your Prim.MD to go over all Hospital Tests and Procedure/Radiological results at the  follow up, please get all Hospital records sent to your Prim MD by signing hospital release before you go home.   If you experience worsening of your admission symptoms, develop shortness of breath, life threatening emergency, suicidal or homicidal thoughts you must seek medical attention immediately by calling 911 or calling your MD immediately  if symptoms less severe.  You Must read complete instructions/literature along with all the possible adverse reactions/side effects for all the Medicines you take and that have been prescribed to you. Take any new Medicines after you have completely understood and accpet all the possible adverse reactions/side effects.   Do  not drive, operate heavy machinery, perform activities at heights, swimming or participation in water activities or provide baby sitting services if your were admitted for syncope or siezures until you have seen by Primary MD or a Neurologist and advised to do so again.  Do not drive when taking Pain medications.    Do not take more than prescribed Pain, Sleep and Anxiety Medications  Special Instructions: If you have smoked or chewed Tobacco  in the last 2 yrs please stop smoking, stop any regular Alcohol  and or any Recreational drug use.  Wear Seat belts while driving.   Please note  You were cared for by a hospitalist during your hospital stay. If you have any questions about your discharge medications or the care you received while you were in the hospital after you are discharged, you can call the unit and asked to speak with the hospitalist on call if the hospitalist that took care of you is not available. Once you are discharged, your primary care physician will handle any further medical issues. Please note that NO REFILLS for any discharge medications will be authorized once you are discharged, as it is imperative that you return to your primary care physician (or establish a relationship with a primary care physician if  you do not have one) for your aftercare needs so that they can reassess your need for medications and monitor your lab values.     Increase activity slowly    Complete by:  As directed              Discharge Medications       Medication List    TAKE these medications        acetaminophen 500 MG tablet  Commonly known as:  TYLENOL  Take 1,000 mg by mouth every 6 (six) hours as needed for fever or headache.     albuterol 108 (90 Base) MCG/ACT inhaler  Commonly known as:  PROVENTIL HFA;VENTOLIN HFA  Inhale 2 puffs into the lungs every 6 (six) hours as needed for wheezing.     Biotin 1610910000 MCG Tbdp  Take 10,000 mcg by mouth daily.     CALCIUM 600 + D PO  Take 1 tablet by mouth daily.     cefpodoxime 200 MG tablet  Commonly known as:  VANTIN  Take 1 tablet (200 mg total) by mouth 2 (two) times daily.     EPINEPHrine 0.3 mg/0.3 mL Devi  Commonly known as:  EPI-PEN  Inject 0.3 mLs (0.3 mg total) into the muscle once.     Melatonin 10 MG Tabs  Take 10 mg by mouth at bedtime as needed (sleep).     multivitamin with minerals tablet  Take 1 tablet by mouth daily.        Major procedures and Radiology Reports - PLEASE review detailed and final reports for all details, in brief -       Ct Head Wo Contrast  05/30/2016  CLINICAL DATA:  Occipital headache for 3-4 days. EXAM: CT HEAD WITHOUT CONTRAST TECHNIQUE: Contiguous axial images were obtained from the base of the skull through the vertex without intravenous contrast. COMPARISON:  10/28/2013 FINDINGS: The brain demonstrates no evidence of hemorrhage, infarction, edema, mass effect, extra-axial fluid collection, hydrocephalus or mass lesion. The skull is unremarkable. IMPRESSION: Normal head CT. Electronically Signed   By: Irish LackGlenn  Yamagata M.D.   On: 05/30/2016 08:05   Ct Abdomen Pelvis W Contrast  05/29/2016  CLINICAL DATA:  Right-sided pain and  fever. EXAM: CT ABDOMEN AND PELVIS WITH CONTRAST TECHNIQUE: Multidetector CT  imaging of the abdomen and pelvis was performed using the standard protocol following bolus administration of intravenous contrast. CONTRAST:  ISOVUE-300 IOPAMIDOL (ISOVUE-300) INJECTION 61% COMPARISON:  Plain films of 01/05/2015. Most recent CT of 07/20/2009. FINDINGS: Lower chest: 4 mm left lower lobe pulmonary nodule on image 1/ series 3 was present on the prior and can be considered benign. Normal heart size without pericardial or pleural effusion. Hepatobiliary: Mild hepatic steatosis suspected. More focal hypoattenuation within the anterior aspect of segment 4 including on image 27/series 2 is favored to be related to focal steatosis/perfusion anomaly. Cholecystectomy, without biliary ductal dilatation. Pancreas: Normal, without mass or ductal dilatation. Spleen: Normal in size, without focal abnormality. Adrenals/Urinary Tract: Normal adrenal glands. 4 mm right renal collecting system calculus. Left renal too small to characterize lesions. Heterogeneous enhancement with decreased corticomedullary differentiation identified within the inter and lower pole right kidney. Mild right perinephric edema/interstitial thickening. Somewhat more focal masslike area of hypoattenuation involves the interpolar right kidney on image 44/series 2. This measures on the order of 2.3 x 2.7 cm on image 22/series 7. No hydronephrosis. Normal urinary bladder. Stomach/Bowel: Normal stomach, without wall thickening. Normal colon, appendix, and terminal ileum. Normal small bowel. Vascular/Lymphatic: Significantly age advanced but mild aortic and branch vessel atherosclerosis. Patent right renal vein. No abdominopelvic adenopathy. Reproductive: Normal uterus and adnexa. Other: No significant free fluid. Musculoskeletal: Mild disc bulges at L4-5 and L5-S1. IMPRESSION: 1. Findings consistent with right-sided pyelonephritis. A somewhat more focal area of hypoattenuation medially is favored to represent lobar nephronia. recommend  appropriate antibiotic therapy and consideration imaging follow-up with pre and post contrast abdominal MRI at 2-3 months (to confirm resolution and exclude a highly unlikely concurrent neoplasm). MRI favored over CT secondary lack of radiation in this young patient. 2. Segment 4 area of anterior hepatic lobe hypoattenuation is favored to represent focal steatosis and/or perfusion anomaly. This could also be confirmed on the above follow-up MRI. 3. Right nephrolithiasis. 4. Age advanced atherosclerosis. These results will be called to the ordering clinician or representative by the Radiologist Assistant, and communication documented in the PACS or zVision Dashboard. Electronically Signed   By: Jeronimo Greaves M.D.   On: 05/29/2016 11:58   Dg Chest Port 1 View  05/29/2016  CLINICAL DATA:  Fever. EXAM: PORTABLE CHEST 1 VIEW COMPARISON:  January 05, 2015. FINDINGS: The heart size and mediastinal contours are within normal limits. Both lungs are clear. No pneumothorax or pleural effusion is noted. The visualized skeletal structures are unremarkable. IMPRESSION: No active disease. Electronically Signed   By: Lupita Raider, M.D.   On: 05/29/2016 16:20    Micro Results      Recent Results (from the past 240 hour(s))  Blood culture (routine x 2)     Status: None (Preliminary result)   Collection Time: 05/29/16 10:10 AM  Result Value Ref Range Status   Specimen Description BLOOD LEFT ANTECUBITAL  Final   Special Requests BOTTLES DRAWN AEROBIC AND ANAEROBIC EACH  Final   Culture   Final    NO GROWTH 2 DAYS Performed at [facility]    Report Status PENDING  Incomplete  Urine culture     Status: Abnormal   Collection Time: 05/29/16 12:44 PM  Result Value Ref Range Status   Specimen Description URINE, CLEAN CATCH  Final   Special Requests NONE  Final   Culture >=100,000 COLONIES/mL ENTEROBACTER AEROGENES (A)  Final   Report Status 05/31/2016 FINAL  Final   Organism ID, Bacteria ENTEROBACTER  AEROGENES (A)  Final      Susceptibility   Enterobacter aerogenes - MIC*    CEFAZOLIN >=64 RESISTANT Resistant     CEFTRIAXONE <=1 SENSITIVE Sensitive     CIPROFLOXACIN <=0.25 SENSITIVE Sensitive     GENTAMICIN <=1 SENSITIVE Sensitive     IMIPENEM 1 SENSITIVE Sensitive     NITROFURANTOIN 128 RESISTANT Resistant     TRIMETH/SULFA <=20 SENSITIVE Sensitive     PIP/TAZO <=4 SENSITIVE Sensitive     * >=100,000 COLONIES/mL ENTEROBACTER AEROGENES  Blood culture (routine x 2)     Status: None (Preliminary result)   Collection Time: 05/29/16  2:01 PM  Result Value Ref Range Status   Specimen Description BLOOD RIGHT HAND  Final   Special Requests BOTTLES DRAWN AEROBIC AND ANAEROBIC 5 ML EACH  Final   Culture   Final    NO GROWTH 2 DAYS Performed at [facility]    Report Status PENDING  Incomplete  MRSA PCR Screening     Status: None   Collection Time: 05/29/16  3:42 PM  Result Value Ref Range Status   MRSA by PCR NEGATIVE NEGATIVE Final    Comment:        The GeneXpert MRSA Assay (FDA approved for NASAL specimens only), is one component of a comprehensive MRSA colonization surveillance program. It is not intended to diagnose MRSA infection nor to guide or monitor treatment for MRSA infections.     Today   Subjective    Erica Lang today has no headache,no chest abdominal pain,no new weakness tingling or numbness, feels much better wants to go home today.    Objective   Blood pressure 114/66, pulse 84, temperature 99.9 F (37.7 C), temperature source Oral, resp. rate 20, height 5\' 10"  (1.778 m), weight 113.6 kg (250 lb 7.1 oz), last menstrual period 05/01/2016, SpO2 100 %.   Intake/Output Summary (Last 24 hours) at 05/31/16 1916 Last data filed at 05/31/16 0530  Gross per 24 hour  Intake    240 ml  Output   1275 ml  Net  -1035 ml    Exam Awake Alert, Oriented x 3, No new F.N deficits, Normal affect [place].AT,PERRAL Supple Neck,No JVD, No cervical  lymphadenopathy appriciated.  Symmetrical Chest wall movement, Good air movement bilaterally, CTAB RRR,No Gallops,Rubs or new Murmurs, No Parasternal Heave +ve B.Sounds, Abd Soft, Non tender, No organomegaly appriciated, No rebound -guarding or rigidity. No Cyanosis, Clubbing or edema, No new Rash or bruise   Data Review   CBC w Diff:  Lab Results  Component Value Date   WBC 5.9 05/31/2016   HGB 9.2* 05/31/2016   HCT 27.4* 05/31/2016   PLT 182 05/31/2016   LYMPHOPCT 18 05/30/2016   MONOPCT 11 05/30/2016   EOSPCT 0 05/30/2016   BASOPCT 0 05/30/2016    CMP:  Lab Results  Component Value Date   NA 137 05/31/2016   K 3.8 05/31/2016   CL 105 05/31/2016   CO2 23 05/31/2016   BUN 8 05/31/2016   CREATININE 0.57 05/31/2016   CREATININE 0.61 Performed at [facility] 06/23/09 06/23/2009   PROT 6.3* 05/30/2016   ALBUMIN 3.0* 05/30/2016   BILITOT 0.7 05/30/2016   ALKPHOS 45 05/30/2016   AST 34 05/30/2016   ALT 32 05/30/2016  .   Total Time in preparing paper work, data evaluation and todays exam - 35 minutes  Leroy Sea M.D on  05/31/2016 at 7:16 PM  [facility]   Office  [phone]

## 2016-05-31 NOTE — Discharge Planning (Signed)
Erica Lang, is a 40 y.o. female  DOB 1976/11/29  MRN [id number].  Admission date:  05/29/2016  Admitting Physician  Rodolph Bonganiel Thompson V, MD  Discharge Date:  05/31/2016   Primary MD  Farris HasMORROW, AARON, MD  Recommendations for primary care physician for things to follow:   Check CBC, CMP in a week. She needs to follow with urology for repeat imaging of her kidneys within 1-2 weeks.   Admission Diagnosis  Pyelonephritis [N12] Sepsis, due to unspecified organism [name] [A41.9]   Discharge Diagnosis  Pyelonephritis [N12] Sepsis, due to unspecified organism [name] [A41.9]    Principal Problem:   Sepsis (HCC) Active Problems:   Persistent headaches   Anxiety   Hypertension   Obesity   PCOS (polycystic ovarian syndrome)   Pyelonephritis   Acute UTI   Headache      Past Medical History  Diagnosis Date  . Anemia   . Anxiety   . PCOS (polycystic ovarian syndrome)   . History of exercise stress test dr Eden Emmsnishan    11-11-2013--  normal/  good exercise capacity, no chest pain, no ischemia  . History of abnormal cervical Pap smear   . History of pre-eclampsia 11/ 2014    hypertension and postpartum pulmonary edema--  resolved  . Bilateral carpal tunnel syndrome   . Mild asthma   . IBS (irritable bowel syndrome)     Past Surgical History  Procedure Laterality Date  . Cholecystectomy N/A 10/27/2014    Procedure: LAPAROSCOPIC CHOLECYSTECTOMY WITH INTRAOPERATIVE CHOLANGIOGRAM;  Surgeon: Atilano InaEric M Wilson, MD;  Location: [facility];  Service: General;  Laterality: N/A;  . I & d  left vulvar hematoma, postpartum  07-05-2009  . Transthoracic echocardiogram  10-28-2013    normal LVF,  ef 60-65%,  mild AR , MR and , TR  . Carpal tunnel release Right 08/02/2015    Procedure: RIGHT HAND CARPAL TUNNEL RELEASE;  Surgeon: Bradly BienenstockFred Ortmann,  MD;  [facility];  Service: Orthopedics;  Laterality: Right;       HPI  from the history and physical done on the day of admission:    Erica Lang is a 40 y.o. female with medical history significant of polycystic ovarian syndrome, anxiety, anemia presenting to the ED with a three-day history of high fevers with temperatures as high as 104, headache, facial pain, lower abdominal pain and right lower back pain with associated strong urine odor or, chills, palpitations, nausea, emesis. Patient also endorses some constipation. Patient denies any chest pain, no shortness of breath, no melena, no hematemesis, no hematochezia, no cough, no visual changes, no asymmetric weakness or numbness.  ED Course: Patient was seen in the ED, compressive metabolic profile unremarkable. CBC had a white count of 15.5 hemoglobin of 11.1. Chest x-ray no acute cardiopulmonary disease. Urinalysis had moderate hemoglobin trace leukocytes, negative nitrite, 6-30 WBCs, specific gravity greater than 1.046. CT abdomen and pelvis which was done showed findings consistent with right-sided pyelonephritis. Somewhat more focal area of hypoattenuation medially is favored  to represent lobar nephronia. Segment 4 area of anterior hepatic lobe hypoattenuation is favored to represent focal steatosis and a perfusion anomaly. Right nephrolithiasis. Age advanced atherosclerosis. Patient noted to have a temperature of 103 in the emergency room with heart rates of 110 and borderline blood pressure. Patient was given IV Rocephin. [facility] were called to admit the patient for further evaluation and management.     Hospital Course:     1. Right-sided pyelonephritis. Is not septic, lactate progress at home in stable, Responded very well IV Rocephin, now back to baseline and afebrile, leukocytosis resolved, urine culture growing pansensitive Enterobacter, will be placed on 12 more days of oral Vantin to complete  total of 14 days. Request PCP to check CBC and CMP within a week. Needs outpatient urology follow-up for repeat imaging of her kidneys as initial CT scan of her kidneys was abnormal.   2. Mild generalized headache. Due to severe infection and fevers. CT head unremarkable, and a completely resolved after a single dose ibuprofen.   3.Obesity - follow with PCP.       Follow UP  Follow-up Information    Follow up with Farris Has, MD. Schedule an appointment as soon as possible for a visit in 1 week.   Specialty:  Family Medicine   Contact information:   [address] [place] [address] [phone]       Follow up with [facility], MATTHEW, MD. Schedule an appointment as soon as possible for a visit in 1 week.   Specialty:  Urology   Contact information:   [address] [place] [address] [phone]        Consults obtained - Urology  Discharge Condition: Stable  Diet and Activity recommendation: See Discharge Instructions below  Discharge Instructions       Discharge Instructions    Diet - low sodium heart healthy    Complete by:  As directed      Discharge instructions    Complete by:  As directed   Follow with Primary MD Farris Has, MD in 7 days   Get CBC, CMP, 2 view Chest X ray checked  by Primary MD next visit.    Activity: As tolerated with Full fall precautions use walker/cane & assistance as needed   Disposition Home    Diet:   Heart Healthy .  For Heart failure patients - Check your Weight same time everyday, if you gain over 2 pounds, or you develop in leg swelling, experience more shortness of breath or chest pain, call your Primary MD immediately. Follow Cardiac Low Salt Diet and 1.5 lit/day fluid restriction.   On your next visit with your primary care physician please Get Medicines reviewed and adjusted.   Please request your Prim.MD to go over all Hospital Tests and Procedure/Radiological results at the follow  up, please get all Hospital records sent to your Prim MD by signing hospital release before you go home.   If you experience worsening of your admission symptoms, develop shortness of breath, life threatening emergency, suicidal or homicidal thoughts you must seek medical attention immediately by calling 911 or calling your MD immediately  if symptoms less severe.  You Must read complete instructions/literature along with all the possible adverse reactions/side effects for all the Medicines you take and that have been prescribed to you. Take any new Medicines after you have completely understood and accpet all the possible adverse reactions/side effects.   Do not drive, operate heavy  machinery, perform activities at heights, swimming or participation in water activities or provide baby sitting services if your were admitted for syncope or siezures until you have seen by Primary MD or a Neurologist and advised to do so again.  Do not drive when taking Pain medications.    Do not take more than prescribed Pain, Sleep and Anxiety Medications  Special Instructions: If you have smoked or chewed Tobacco  in the last 2 yrs please stop smoking, stop any regular Alcohol  and or any Recreational drug use.  Wear Seat belts while driving.   Please note  You were cared for by a hospitalist during your hospital stay. If you have any questions about your discharge medications or the care you received while you were in the hospital after you are discharged, you can call the unit and asked to speak with the hospitalist on call if the hospitalist that took care of you is not available. Once you are discharged, your primary care physician will handle any further medical issues. Please note that NO REFILLS for any discharge medications will be authorized once you are discharged, as it is imperative that you return to your primary care physician (or establish a relationship with a primary care physician if you do  not have one) for your aftercare needs so that they can reassess your need for medications and monitor your lab values.     Increase activity slowly    Complete by:  As directed              Discharge Medications       Medication List    TAKE these medications        acetaminophen 500 MG tablet  Commonly known as:  TYLENOL  Take 1,000 mg by mouth every 6 (six) hours as needed for fever or headache.     albuterol 108 (90 Base) MCG/ACT inhaler  Commonly known as:  PROVENTIL HFA;VENTOLIN HFA  Inhale 2 puffs into the lungs every 6 (six) hours as needed for wheezing.     Biotin 16109 MCG Tbdp  Take 10,000 mcg by mouth daily.     CALCIUM 600 + D PO  Take 1 tablet by mouth daily.     cefpodoxime 200 MG tablet  Commonly known as:  VANTIN  Take 1 tablet (200 mg total) by mouth 2 (two) times daily.     EPINEPHrine 0.3 mg/0.3 mL Devi  Commonly known as:  EPI-PEN  Inject 0.3 mLs (0.3 mg total) into the muscle once.     Melatonin 10 MG Tabs  Take 10 mg by mouth at bedtime as needed (sleep).     multivitamin with minerals tablet  Take 1 tablet by mouth daily.        Major procedures and Radiology Reports - PLEASE review detailed and final reports for all details, in brief -       Ct Head Wo Contrast  05/30/2016  CLINICAL DATA:  Occipital headache for 3-4 days. EXAM: CT HEAD WITHOUT CONTRAST TECHNIQUE: Contiguous axial images were obtained from the base of the skull through the vertex without intravenous contrast. COMPARISON:  10/28/2013 FINDINGS: The brain demonstrates no evidence of hemorrhage, infarction, edema, mass effect, extra-axial fluid collection, hydrocephalus or mass lesion. The skull is unremarkable. IMPRESSION: Normal head CT. Electronically Signed   By: Irish Lack M.D.   On: 05/30/2016 08:05   Ct Abdomen Pelvis W Contrast  05/29/2016  CLINICAL DATA:  Right-sided pain and fever. EXAM: CT ABDOMEN  AND PELVIS WITH CONTRAST TECHNIQUE: Multidetector CT imaging of  the abdomen and pelvis was performed using the standard protocol following bolus administration of intravenous contrast. CONTRAST:  ISOVUE-300 IOPAMIDOL (ISOVUE-300) INJECTION 61% COMPARISON:  Plain films of 01/05/2015. Most recent CT of 07/20/2009. FINDINGS: Lower chest: 4 mm left lower lobe pulmonary nodule on image 1/ series 3 was present on the prior and can be considered benign. Normal heart size without pericardial or pleural effusion. Hepatobiliary: Mild hepatic steatosis suspected. More focal hypoattenuation within the anterior aspect of segment 4 including on image 27/series 2 is favored to be related to focal steatosis/perfusion anomaly. Cholecystectomy, without biliary ductal dilatation. Pancreas: Normal, without mass or ductal dilatation. Spleen: Normal in size, without focal abnormality. Adrenals/Urinary Tract: Normal adrenal glands. 4 mm right renal collecting system calculus. Left renal too small to characterize lesions. Heterogeneous enhancement with decreased corticomedullary differentiation identified within the inter and lower pole right kidney. Mild right perinephric edema/interstitial thickening. Somewhat more focal masslike area of hypoattenuation involves the interpolar right kidney on image 44/series 2. This measures on the order of 2.3 x 2.7 cm on image 22/series 7. No hydronephrosis. Normal urinary bladder. Stomach/Bowel: Normal stomach, without wall thickening. Normal colon, appendix, and terminal ileum. Normal small bowel. Vascular/Lymphatic: Significantly age advanced but mild aortic and branch vessel atherosclerosis. Patent right renal vein. No abdominopelvic adenopathy. Reproductive: Normal uterus and adnexa. Other: No significant free fluid. Musculoskeletal: Mild disc bulges at L4-5 and L5-S1. IMPRESSION: 1. Findings consistent with right-sided pyelonephritis. A somewhat more focal area of hypoattenuation medially is favored to represent lobar nephronia. recommend appropriate  antibiotic therapy and consideration imaging follow-up with pre and post contrast abdominal MRI at 2-3 months (to confirm resolution and exclude a highly unlikely concurrent neoplasm). MRI favored over CT secondary lack of radiation in this young patient. 2. Segment 4 area of anterior hepatic lobe hypoattenuation is favored to represent focal steatosis and/or perfusion anomaly. This could also be confirmed on the above follow-up MRI. 3. Right nephrolithiasis. 4. Age advanced atherosclerosis. These results will be called to the ordering clinician or representative by the Radiologist Assistant, and communication documented in the PACS or zVision Dashboard. Electronically Signed   By: Jeronimo Greaves M.D.   On: 05/29/2016 11:58   Dg Chest Port 1 View  05/29/2016  CLINICAL DATA:  Fever. EXAM: PORTABLE CHEST 1 VIEW COMPARISON:  January 05, 2015. FINDINGS: The heart size and mediastinal contours are within normal limits. Both lungs are clear. No pneumothorax or pleural effusion is noted. The visualized skeletal structures are unremarkable. IMPRESSION: No active disease. Electronically Signed   By: Lupita Raider, M.D.   On: 05/29/2016 16:20    Micro Results      Recent Results (from the past 240 hour(s))  Blood culture (routine x 2)     Status: None (Preliminary result)   Collection Time: 05/29/16 10:10 AM  Result Value Ref Range Status   Specimen Description BLOOD LEFT ANTECUBITAL  Final   Special Requests BOTTLES DRAWN AEROBIC AND ANAEROBIC EACH  Final   Culture   Final    NO GROWTH < 24 HOURS Performed at [facility]    Report Status PENDING  Incomplete  Urine culture     Status: Abnormal   Collection Time: 05/29/16 12:44 PM  Result Value Ref Range Status   Specimen Description URINE, CLEAN CATCH  Final   Special Requests NONE  Final   Culture >=100,000 COLONIES/mL ENTEROBACTER AEROGENES (A)  Final  Report Status 05/31/2016 FINAL  Final   Organism ID, Bacteria ENTEROBACTER AEROGENES  (A)  Final      Susceptibility   Enterobacter aerogenes - MIC*    CEFAZOLIN >=64 RESISTANT Resistant     CEFTRIAXONE <=1 SENSITIVE Sensitive     CIPROFLOXACIN <=0.25 SENSITIVE Sensitive     GENTAMICIN <=1 SENSITIVE Sensitive     IMIPENEM 1 SENSITIVE Sensitive     NITROFURANTOIN 128 RESISTANT Resistant     TRIMETH/SULFA <=20 SENSITIVE Sensitive     PIP/TAZO <=4 SENSITIVE Sensitive     * >=100,000 COLONIES/mL ENTEROBACTER AEROGENES  Blood culture (routine x 2)     Status: None (Preliminary result)   Collection Time: 05/29/16  2:01 PM  Result Value Ref Range Status   Specimen Description BLOOD RIGHT HAND  Final   Special Requests BOTTLES DRAWN AEROBIC AND ANAEROBIC 5 ML EACH  Final   Culture   Final    NO GROWTH < 24 HOURS Performed at [facility]    Report Status PENDING  Incomplete  MRSA PCR Screening     Status: None   Collection Time: 05/29/16  3:42 PM  Result Value Ref Range Status   MRSA by PCR NEGATIVE NEGATIVE Final    Comment:        The GeneXpert MRSA Assay (FDA approved for NASAL specimens only), is one component of a comprehensive MRSA colonization surveillance program. It is not intended to diagnose MRSA infection nor to guide or monitor treatment for MRSA infections.     Today   Subjective    Erica Lang today has no headache,no chest abdominal pain,no new weakness tingling or numbness, feels much better wants to go home today.    Objective   Blood pressure 114/66, pulse 84, temperature 99.9 F (37.7 C), temperature source Oral, resp. rate 20, height 5\' 10"  (1.778 m), weight 113.6 kg (250 lb 7.1 oz), last menstrual period 05/01/2016, SpO2 100 %.   Intake/Output Summary (Last 24 hours) at 05/31/16 0858 Last data filed at 05/31/16 0530  Gross per 24 hour  Intake    875 ml  Output   1275 ml  Net   -400 ml    Exam Awake Alert, Oriented x 3, No new F.N deficits, Normal affect Knott.AT,PERRAL Supple Neck,No JVD, No cervical lymphadenopathy  appriciated.  Symmetrical Chest wall movement, Good air movement bilaterally, CTAB RRR,No Gallops,Rubs or new Murmurs, No Parasternal Heave +ve B.Sounds, Abd Soft, Non tender, No organomegaly appriciated, No rebound -guarding or rigidity. No Cyanosis, Clubbing or edema, No new Rash or bruise   Data Review   CBC w Diff: Lab Results  Component Value Date   WBC 5.9 05/31/2016   HGB 9.2* 05/31/2016   HCT 27.4* 05/31/2016   PLT 182 05/31/2016   LYMPHOPCT 18 05/30/2016   MONOPCT 11 05/30/2016   EOSPCT 0 05/30/2016   BASOPCT 0 05/30/2016    CMP: Lab Results  Component Value Date   NA 137 05/31/2016   K 3.8 05/31/2016   CL 105 05/31/2016   CO2 23 05/31/2016   BUN 8 05/31/2016   CREATININE 0.57 05/31/2016   CREATININE 0.61 Performed at [facility] 06/23/09 06/23/2009   PROT 6.3* 05/30/2016   ALBUMIN 3.0* 05/30/2016   BILITOT 0.7 05/30/2016   ALKPHOS 45 05/30/2016   AST 34 05/30/2016   ALT 32 05/30/2016  .   Total Time in preparing paper work, data evaluation and todays exam - 35 minutes  Susa Raring K M.D on 05/31/2016 at 8:58  AM  [facility]   Office  [phone]

## 2016-05-31 NOTE — Discharge Instructions (Signed)
Follow with Primary MD MORROW, AARON, MD in 7 days  ° °Get CBC, CMP, 2 view Chest X ray checked  by Primary MD next visit.  ° ° °Activity: As tolerated with Full fall precautions use walker/cane & assistance as needed ° ° °Disposition Home   ° ° °Diet:   Heart Healthy   ° °For Heart failure patients - Check your Weight same time everyday, if you gain over 2 pounds, or you develop in leg swelling, experience more shortness of breath or chest pain, call your Primary MD immediately. Follow Cardiac Low Salt Diet and 1.5 lit/day fluid restriction. ° ° °On your next visit with your primary care physician please Get Medicines reviewed and adjusted. ° ° °Please request your Prim.MD to go over all Hospital Tests and Procedure/Radiological results at the follow up, please get all Hospital records sent to your Prim MD by signing hospital release before you go home. ° ° °If you experience worsening of your admission symptoms, develop shortness of breath, life threatening emergency, suicidal or homicidal thoughts you must seek medical attention immediately by calling 911 or calling your MD immediately  if symptoms less severe. ° °You Must read complete instructions/literature along with all the possible adverse reactions/side effects for all the Medicines you take and that have been prescribed to you. Take any new Medicines after you have completely understood and accpet all the possible adverse reactions/side effects.  ° °Do not drive, operate heavy machinery, perform activities at heights, swimming or participation in water activities or provide baby sitting services if your were admitted for syncope or siezures until you have seen by Primary MD or a Neurologist and advised to do so again. ° °Do not drive when taking Pain medications.  ° ° °Do not take more than prescribed Pain, Sleep and Anxiety Medications ° °Special Instructions: If you have smoked or chewed Tobacco  in the last 2 yrs please stop smoking, stop any regular  Alcohol  and or any Recreational drug use. ° °Wear Seat belts while driving. ° ° °Please note ° °You were cared for by a hospitalist during your hospital stay. If you have any questions about your discharge medications or the care you received while you were in the hospital after you are discharged, you can call the unit and asked to speak with the hospitalist on call if the hospitalist that took care of you is not available. Once you are discharged, your primary care physician will handle any further medical issues. Please note that NO REFILLS for any discharge medications will be authorized once you are discharged, as it is imperative that you return to your primary care physician (or establish a relationship with a primary care physician if you do not have one) for your aftercare needs so that they can reassess your need for medications and monitor your lab values. ° °

## 2016-06-03 LAB — CULTURE, BLOOD (ROUTINE X 2)
Culture: NO GROWTH
Culture: NO GROWTH

## 2017-04-02 DIAGNOSIS — Z1151 Encounter for screening for human papillomavirus (HPV): Secondary | ICD-10-CM | POA: Diagnosis not present

## 2017-04-02 DIAGNOSIS — Z6836 Body mass index (BMI) 36.0-36.9, adult: Secondary | ICD-10-CM | POA: Diagnosis not present

## 2017-04-02 DIAGNOSIS — Z1231 Encounter for screening mammogram for malignant neoplasm of breast: Secondary | ICD-10-CM | POA: Diagnosis not present

## 2017-04-02 DIAGNOSIS — Z01419 Encounter for gynecological examination (general) (routine) without abnormal findings: Secondary | ICD-10-CM | POA: Diagnosis not present

## 2017-04-23 DIAGNOSIS — N946 Dysmenorrhea, unspecified: Secondary | ICD-10-CM | POA: Diagnosis not present

## 2017-04-23 DIAGNOSIS — B373 Candidiasis of vulva and vagina: Secondary | ICD-10-CM | POA: Diagnosis not present

## 2017-04-23 DIAGNOSIS — R300 Dysuria: Secondary | ICD-10-CM | POA: Diagnosis not present

## 2017-06-02 ENCOUNTER — Other Ambulatory Visit: Payer: Self-pay | Admitting: Obstetrics and Gynecology

## 2017-06-15 NOTE — Patient Instructions (Addendum)
Your procedure is scheduled on:  Thursday, June 21  [facility] at: 6:30 am  Pick up the phone at the desk and dial [phone].  Call this number if you have problems the morning of surgery: [phone].  Remember: Do NOT eat or drink clear liquids (including water) after midnight Wednesday  Take these medicines the morning of surgery with a SIP OF WATER:  None  Bring albuterol inhaler with you on day of surgery.  Stop herbal medications and supplements at this time.  Do NOT wear jewelry (body piercing), metal hair clips/bobby pins, make-up, or nail polish. Do NOT wear lotions, powders, or perfumes.  You may wear deoderant. Do NOT shave for 48 hours prior to surgery. Do NOT bring valuables to the hospital.  Leave suitcase in car.  After urgery it may be brought to your room.  For patients admitted to the hospital, checkout time is 11:00 AM the day of discharge. Have a responsible adult drive you home and stay with you for 24 hours after your procedure.  Home with husband Darron cell [phone]

## 2017-06-16 ENCOUNTER — Encounter (HOSPITAL_COMMUNITY)
Admission: RE | Admit: 2017-06-16 | Discharge: 2017-06-16 | Disposition: A | Discharge status: Home / Self Care | Payer: BLUE CROSS/BLUE SHIELD | Source: Ambulatory Visit | Attending: Obstetrics and Gynecology | Admitting: Obstetrics and Gynecology

## 2017-06-16 ENCOUNTER — Encounter (HOSPITAL_COMMUNITY): Payer: Self-pay

## 2017-06-16 DIAGNOSIS — N920 Excessive and frequent menstruation with regular cycle: Secondary | ICD-10-CM | POA: Diagnosis not present

## 2017-06-16 DIAGNOSIS — K469 Unspecified abdominal hernia without obstruction or gangrene: Secondary | ICD-10-CM | POA: Diagnosis not present

## 2017-06-16 DIAGNOSIS — Z01812 Encounter for preprocedural laboratory examination: Secondary | ICD-10-CM | POA: Insufficient documentation

## 2017-06-16 DIAGNOSIS — N946 Dysmenorrhea, unspecified: Secondary | ICD-10-CM | POA: Diagnosis not present

## 2017-06-16 DIAGNOSIS — K660 Peritoneal adhesions (postprocedural) (postinfection): Secondary | ICD-10-CM | POA: Diagnosis not present

## 2017-06-16 HISTORY — DX: Headache, unspecified: R51.9

## 2017-06-16 HISTORY — DX: Headache: R51

## 2017-06-16 LAB — BASIC METABOLIC PANEL
Anion gap: 10 (ref 5–15)
BUN: 12 mg/dL (ref 6–20)
CHLORIDE: 104 mmol/L (ref 101–111)
CO2: 21 mmol/L — ABNORMAL LOW (ref 22–32)
CREATININE: 0.7 mg/dL (ref 0.44–1.00)
Calcium: 10.2 mg/dL (ref 8.9–10.3)
GFR calc Af Amer: >60 mL/min (ref >60)
GFR calc non Af Amer: >60 mL/min (ref >60)
GLUCOSE: 83 mg/dL (ref 65–99)
POTASSIUM: 3.9 mmol/L (ref 3.5–5.1)
Sodium: 135 mmol/L (ref 135–145)

## 2017-06-16 LAB — CBC
HEMATOCRIT: 36.5 % (ref 36.0–46.0)
Hemoglobin: 12.9 g/dL (ref 12.0–15.0)
MCH: 33.1 pg (ref 26.0–34.0)
MCHC: 35.3 g/dL (ref 30.0–36.0)
MCV: 93.6 fL (ref 78.0–100.0)
PLATELETS: 275 10*3/uL (ref 150–400)
RBC: 3.9 MIL/uL (ref 3.87–5.11)
RDW: 13 % (ref 11.5–15.5)
WBC: 8.5 10*3/uL (ref 4.0–10.5)

## 2017-06-16 LAB — TYPE AND SCREEN
ABO/RH(D): O POS
Antibody Screen: NEGATIVE

## 2017-06-17 NOTE — H&P (Signed)
NAMFrederich Cha:  Mccarn, ALICIA                 ACCOUNT NO.:  [id number]  MEDICAL RECORD NO.:  [id number]  LOCATION:                                 FACILITY:  PHYSICIAN:  Lenoard Adenichard J. Kimaria Struthers, M.D.     DATE OF BIRTH:  DATE OF ADMISSION:  06/18/2017 DATE OF DISCHARGE:                             HISTORY & PHYSICAL   PREOPERATIVE DIAGNOSES:  Refractory dysmenorrhea and menorrhagia.  HISTORY OF PRESENT ILLNESS:  A 41 year old African American female, G3, P2, with aforementioned complaints for definitive therapy.  ALLERGIES:  She has allergies to INSECT STINGS only.  MEDICATIONS:  Ibuprofen, __________, biotin, calcium, and prenatal vitamins.  SOCIAL HISTORY:  Nonsmoker, nondrinker.  Denies domestic physical violence.  PAST MEDICAL HISTORY: 1. History of vaginal delivery x2. 2. Laparoscopy x1. 3. Carpal tunnel surgery in 2016.  PHYSICAL EXAMINATION:  GENERAL:  A well-developed, well-nourished, African American female, in no acute distress. HEENT:  Normal. NECK:  Supple.  Full range of motion. LUNGS:  Clear. HEART:  Regular rate and rhythm. ABDOMEN:  Soft, obese, nontender. PELVIC:  Uterus is 8 to 10-week size.  No adnexal masses. EXTREMITIES:  There are no cords. NEUROLOGIC:  Nonfocal. SKIN:  Intact.  IMPRESSIONS:  Refractory dysmenorrhea and menorrhagia, for definitive therapy.  PLAN:  Proceed with da Vinci total laparoscopic hysterectomy and bilateral salpingectomy.  Risks of anesthesia, infection, bleeding, injury to surrounding organs, and possible need for repair were discussed.  Delayed versus immediate complications to include bowel and bladder injury are noted.  The patient acknowledges and wishes to proceed.     Lenoard Adenichard J. Ricky Doan, M.D.     RJT/MEDQ  D:  06/17/2017  T:  06/17/2017  Job:  [id number]

## 2017-06-18 ENCOUNTER — Encounter (HOSPITAL_COMMUNITY)
Admission: RE | Disposition: A | Discharge status: Home / Self Care | Payer: Self-pay | Source: Ambulatory Visit | Attending: Obstetrics and Gynecology

## 2017-06-18 ENCOUNTER — Encounter (HOSPITAL_COMMUNITY): Payer: Self-pay | Admitting: Anesthesiology

## 2017-06-18 ENCOUNTER — Ambulatory Visit (HOSPITAL_COMMUNITY): Payer: BLUE CROSS/BLUE SHIELD | Admitting: Anesthesiology

## 2017-06-18 ENCOUNTER — Ambulatory Visit (HOSPITAL_COMMUNITY)
Admission: RE | Admit: 2017-06-18 | Discharge: 2017-06-19 | Disposition: A | Discharge status: Home / Self Care | Payer: BLUE CROSS/BLUE SHIELD | Source: Ambulatory Visit | Attending: Obstetrics and Gynecology | Admitting: Obstetrics and Gynecology

## 2017-06-18 ENCOUNTER — Encounter (HOSPITAL_COMMUNITY): Payer: Self-pay

## 2017-06-18 DIAGNOSIS — K469 Unspecified abdominal hernia without obstruction or gangrene: Secondary | ICD-10-CM | POA: Insufficient documentation

## 2017-06-18 DIAGNOSIS — K660 Peritoneal adhesions (postprocedural) (postinfection): Secondary | ICD-10-CM | POA: Diagnosis not present

## 2017-06-18 DIAGNOSIS — D649 Anemia, unspecified: Secondary | ICD-10-CM | POA: Diagnosis not present

## 2017-06-18 DIAGNOSIS — N920 Excessive and frequent menstruation with regular cycle: Secondary | ICD-10-CM | POA: Diagnosis not present

## 2017-06-18 DIAGNOSIS — D259 Leiomyoma of uterus, unspecified: Secondary | ICD-10-CM | POA: Diagnosis not present

## 2017-06-18 DIAGNOSIS — I10 Essential (primary) hypertension: Secondary | ICD-10-CM | POA: Diagnosis not present

## 2017-06-18 DIAGNOSIS — N946 Dysmenorrhea, unspecified: Secondary | ICD-10-CM | POA: Diagnosis present

## 2017-06-18 HISTORY — PX: ROBOTIC ASSISTED TOTAL HYSTERECTOMY WITH SALPINGECTOMY: SHX6679

## 2017-06-18 SURGERY — ROBOTIC ASSISTED TOTAL HYSTERECTOMY WITH SALPINGECTOMY
Anesthesia: General | Site: Abdomen | Laterality: Bilateral

## 2017-06-18 MED ORDER — SUGAMMADEX SODIUM 500 MG/5ML IV SOLN
INTRAVENOUS | Status: AC
  Filled 2017-06-18: qty 5

## 2017-06-18 MED ORDER — FENTANYL CITRATE (PF) 250 MCG/5ML IJ SOLN
INTRAMUSCULAR | Status: AC
  Filled 2017-06-18: qty 5

## 2017-06-18 MED ORDER — FENTANYL CITRATE (PF) 100 MCG/2ML IJ SOLN
INTRAMUSCULAR | Status: AC
  Filled 2017-06-18: qty 2

## 2017-06-18 MED ORDER — HYDROMORPHONE HCL 1 MG/ML IJ SOLN
INTRAMUSCULAR | Status: DC | PRN
  Administered 2017-06-18: 1 mg via INTRAVENOUS

## 2017-06-18 MED ORDER — LACTATED RINGERS IV SOLN
INTRAVENOUS | Status: DC
  Administered 2017-06-18 (×2): via INTRAVENOUS

## 2017-06-18 MED ORDER — KETOROLAC TROMETHAMINE 30 MG/ML IJ SOLN
INTRAMUSCULAR | Status: DC | PRN
  Administered 2017-06-18: 30 mg via INTRAVENOUS

## 2017-06-18 MED ORDER — HYDROMORPHONE HCL 1 MG/ML IJ SOLN
0.2500 mg | INTRAMUSCULAR | Status: DC | PRN
Start: 2017-06-18 — End: 2017-06-18
  Administered 2017-06-18 (×4): 0.25 mg via INTRAVENOUS

## 2017-06-18 MED ORDER — NALOXONE HCL 0.4 MG/ML IJ SOLN: 0.4000 mg | INTRAMUSCULAR | Status: DC | PRN

## 2017-06-18 MED ORDER — ONDANSETRON HCL 4 MG/2ML IJ SOLN
INTRAMUSCULAR | Status: AC
  Filled 2017-06-18: qty 2

## 2017-06-18 MED ORDER — SCOPOLAMINE 1 MG/3DAYS TD PT72
MEDICATED_PATCH | TRANSDERMAL | Status: AC
  Administered 2017-06-18: 1.5 mg via TRANSDERMAL
  Filled 2017-06-18: qty 1

## 2017-06-18 MED ORDER — DEXAMETHASONE SODIUM PHOSPHATE 10 MG/ML IJ SOLN
INTRAMUSCULAR | Status: DC | PRN
  Administered 2017-06-18: 10 mg via INTRAVENOUS

## 2017-06-18 MED ORDER — LIDOCAINE HCL (CARDIAC) 20 MG/ML IV SOLN
INTRAVENOUS | Status: DC | PRN
  Administered 2017-06-18: 60 mg via INTRAVENOUS

## 2017-06-18 MED ORDER — DIPHENHYDRAMINE HCL 12.5 MG/5ML PO ELIX: 12.5000 mg | ORAL_SOLUTION | Freq: Four times a day (QID) | ORAL | Status: DC | PRN

## 2017-06-18 MED ORDER — PROMETHAZINE HCL 25 MG/ML IJ SOLN: 6.2500 mg | INTRAMUSCULAR | Status: DC | PRN

## 2017-06-18 MED ORDER — CEFAZOLIN SODIUM-DEXTROSE 2-4 GM/100ML-% IV SOLN
2.0000 g | INTRAVENOUS | Status: AC
  Administered 2017-06-18: 2 g via INTRAVENOUS

## 2017-06-18 MED ORDER — SCOPOLAMINE 1 MG/3DAYS TD PT72
1.0000 | MEDICATED_PATCH | Freq: Once | TRANSDERMAL | Status: DC
  Administered 2017-06-18: 1.5 mg via TRANSDERMAL

## 2017-06-18 MED ORDER — DIPHENHYDRAMINE HCL 50 MG/ML IJ SOLN: 12.5000 mg | Freq: Four times a day (QID) | INTRAMUSCULAR | Status: DC | PRN

## 2017-06-18 MED ORDER — HYDROMORPHONE HCL 1 MG/ML IJ SOLN
INTRAMUSCULAR | Status: AC
  Filled 2017-06-18: qty 0.5

## 2017-06-18 MED ORDER — LACTATED RINGERS IV SOLN: INTRAVENOUS | Status: DC

## 2017-06-18 MED ORDER — DEXAMETHASONE SODIUM PHOSPHATE 10 MG/ML IJ SOLN
INTRAMUSCULAR | Status: AC
  Filled 2017-06-18: qty 1

## 2017-06-18 MED ORDER — ONDANSETRON HCL 4 MG/2ML IJ SOLN
INTRAMUSCULAR | Status: DC | PRN
  Administered 2017-06-18: 4 mg via INTRAVENOUS

## 2017-06-18 MED ORDER — LIDOCAINE HCL (CARDIAC) 20 MG/ML IV SOLN
INTRAVENOUS | Status: AC
  Filled 2017-06-18: qty 5

## 2017-06-18 MED ORDER — ROPIVACAINE HCL 5 MG/ML IJ SOLN
INTRAMUSCULAR | Status: AC
  Filled 2017-06-18: qty 30

## 2017-06-18 MED ORDER — ROPIVACAINE HCL 5 MG/ML IJ SOLN
INTRAMUSCULAR | Status: DC | PRN
  Administered 2017-06-18: 60 mL

## 2017-06-18 MED ORDER — BUPIVACAINE HCL (PF) 0.25 % IJ SOLN
INTRAMUSCULAR | Status: AC
  Filled 2017-06-18: qty 30

## 2017-06-18 MED ORDER — KETOROLAC TROMETHAMINE 30 MG/ML IJ SOLN
INTRAMUSCULAR | Status: AC
  Filled 2017-06-18: qty 1

## 2017-06-18 MED ORDER — HYDROMORPHONE 1 MG/ML IV SOLN
INTRAVENOUS | Status: DC
  Administered 2017-06-18: 0.4 mg via INTRAVENOUS
  Administered 2017-06-18: 0.2 mg via INTRAVENOUS
  Administered 2017-06-18: 1.2 mg via INTRAVENOUS
  Administered 2017-06-18: 13:00:00 via INTRAVENOUS
  Administered 2017-06-19: 0.6 mg via INTRAVENOUS
  Administered 2017-06-19: 0.4 mg via INTRAVENOUS
  Filled 2017-06-18: qty 25

## 2017-06-18 MED ORDER — ONDANSETRON HCL 4 MG/2ML IJ SOLN
4.0000 mg | Freq: Four times a day (QID) | INTRAMUSCULAR | Status: DC | PRN
  Administered 2017-06-18: 4 mg via INTRAVENOUS
  Filled 2017-06-18: qty 2

## 2017-06-18 MED ORDER — HYDROMORPHONE HCL 1 MG/ML IJ SOLN
INTRAMUSCULAR | Status: AC
  Filled 2017-06-18: qty 1

## 2017-06-18 MED ORDER — SODIUM CHLORIDE 0.9 % IJ SOLN
INTRAMUSCULAR | Status: AC
  Filled 2017-06-18: qty 50

## 2017-06-18 MED ORDER — PROPOFOL 10 MG/ML IV BOLUS
INTRAVENOUS | Status: DC | PRN
  Administered 2017-06-18: 150 mg via INTRAVENOUS

## 2017-06-18 MED ORDER — SODIUM CHLORIDE 0.9% FLUSH: 9.0000 mL | INTRAVENOUS | Status: DC | PRN

## 2017-06-18 MED ORDER — BUPIVACAINE HCL (PF) 0.25 % IJ SOLN
INTRAMUSCULAR | Status: DC | PRN
  Administered 2017-06-18: 30 mL

## 2017-06-18 MED ORDER — ROCURONIUM BROMIDE 100 MG/10ML IV SOLN
INTRAVENOUS | Status: AC
  Filled 2017-06-18: qty 1

## 2017-06-18 MED ORDER — PROPOFOL 10 MG/ML IV BOLUS
INTRAVENOUS | Status: AC
  Filled 2017-06-18: qty 40

## 2017-06-18 MED ORDER — SODIUM CHLORIDE 0.9 % IJ SOLN
INTRAMUSCULAR | Status: DC | PRN
  Administered 2017-06-18: 10 mL

## 2017-06-18 MED ORDER — LACTATED RINGERS IR SOLN
Status: DC | PRN
  Administered 2017-06-18: 3000 mL

## 2017-06-18 MED ORDER — MIDAZOLAM HCL 2 MG/2ML IJ SOLN
INTRAMUSCULAR | Status: DC | PRN
  Administered 2017-06-18: 2 mg via INTRAVENOUS

## 2017-06-18 MED ORDER — SUGAMMADEX SODIUM 500 MG/5ML IV SOLN
INTRAVENOUS | Status: DC | PRN
  Administered 2017-06-18: 217.2 mg via INTRAVENOUS

## 2017-06-18 MED ORDER — MIDAZOLAM HCL 2 MG/2ML IJ SOLN
INTRAMUSCULAR | Status: AC
  Filled 2017-06-18: qty 2

## 2017-06-18 MED ORDER — TRAMADOL HCL 50 MG PO TABS
50.0000 mg | ORAL_TABLET | Freq: Four times a day (QID) | ORAL | Status: DC | PRN
  Administered 2017-06-19: 50 mg via ORAL
  Filled 2017-06-18: qty 1

## 2017-06-18 MED ORDER — FENTANYL CITRATE (PF) 100 MCG/2ML IJ SOLN
INTRAMUSCULAR | Status: DC | PRN
  Administered 2017-06-18 (×3): 100 ug via INTRAVENOUS

## 2017-06-18 MED ORDER — ROCURONIUM BROMIDE 100 MG/10ML IV SOLN
INTRAVENOUS | Status: DC | PRN
  Administered 2017-06-18: 50 mg via INTRAVENOUS

## 2017-06-18 MED ORDER — DEXTROSE IN LACTATED RINGERS 5 % IV SOLN
INTRAVENOUS | Status: DC
  Administered 2017-06-18 – 2017-06-19 (×2): 125 mL/h via INTRAVENOUS

## 2017-06-18 MED ORDER — MEPERIDINE HCL 25 MG/ML IJ SOLN: 6.2500 mg | INTRAMUSCULAR | Status: DC | PRN

## 2017-06-18 SURGICAL SUPPLY — 53 items
ADH SKN CLS APL DERMABOND .7 (GAUZE/BANDAGES/DRESSINGS) ×1
BARRIER ADHS 3X4 INTERCEED (GAUZE/BANDAGES/DRESSINGS) IMPLANT
BRR ADH 4X3 ABS CNTRL BYND (GAUZE/BANDAGES/DRESSINGS)
CATH FOLEY 3WAY  5CC 16FR (CATHETERS) ×2
CATH FOLEY 3WAY 5CC 16FR (CATHETERS) ×1 IMPLANT
CLOTH BEACON ORANGE TIMEOUT ST (SAFETY) ×3 IMPLANT
CONT PATH 16OZ SNAP LID 3702 (MISCELLANEOUS) ×3 IMPLANT
COVER BACK TABLE 60X90IN (DRAPES) ×6 IMPLANT
COVER TIP SHEARS 8 DVNC (MISCELLANEOUS) ×1 IMPLANT
COVER TIP SHEARS 8MM DA VINCI (MISCELLANEOUS) ×2
DECANTER SPIKE VIAL GLASS SM (MISCELLANEOUS) ×9 IMPLANT
DERMABOND ADVANCED (GAUZE/BANDAGES/DRESSINGS) ×2
DERMABOND ADVANCED .7 DNX12 (GAUZE/BANDAGES/DRESSINGS) ×1 IMPLANT
DURAPREP 26ML APPLICATOR (WOUND CARE) ×3 IMPLANT
ELECT REM PT RETURN 9FT ADLT (ELECTROSURGICAL) ×3
ELECTRODE REM PT RTRN 9FT ADLT (ELECTROSURGICAL) ×1 IMPLANT
GAUZE VASELINE 3X9 (GAUZE/BANDAGES/DRESSINGS) IMPLANT
GLOVE BIO SURGEON STRL SZ7.5 (GLOVE) ×9 IMPLANT
GLOVE BIOGEL PI IND STRL 7.0 (GLOVE) ×2 IMPLANT
GLOVE BIOGEL PI INDICATOR 7.0 (GLOVE) ×4
KIT ACCESSORY DA VINCI DISP (KITS) ×2
KIT ACCESSORY DVNC DISP (KITS) ×1 IMPLANT
LEGGING LITHOTOMY PAIR STRL (DRAPES) ×3 IMPLANT
NEEDLE INSUFFLATION 150MM (ENDOMECHANICALS) ×3 IMPLANT
OCCLUDER COLPOPNEUMO (BALLOONS) ×3 IMPLANT
PACK ROBOT WH (CUSTOM PROCEDURE TRAY) ×3 IMPLANT
PACK ROBOTIC GOWN (GOWN DISPOSABLE) ×3 IMPLANT
PACK TRENDGUARD 450 HYBRID PRO (MISCELLANEOUS) ×1 IMPLANT
PAD PREP 24X48 CUFFED NSTRL (MISCELLANEOUS) ×3 IMPLANT
POUCH LAPAROSCOPIC INSTRUMENT (MISCELLANEOUS) ×2 IMPLANT
PROTECTOR NERVE ULNAR (MISCELLANEOUS) ×6 IMPLANT
SET CYSTO W/LG BORE CLAMP LF (SET/KITS/TRAYS/PACK) IMPLANT
SET IRRIG TUBING LAPAROSCOPIC (IRRIGATION / IRRIGATOR) ×3 IMPLANT
SET TRI-LUMEN FLTR TB AIRSEAL (TUBING) ×3 IMPLANT
SUT VIC AB 0 CT1 27 (SUTURE) ×6
SUT VIC AB 0 CT1 27XBRD ANBCTR (SUTURE) ×2 IMPLANT
SUT VICRYL 0 UR6 27IN ABS (SUTURE) ×3 IMPLANT
SUT VICRYL RAPIDE 4/0 PS 2 (SUTURE) ×6 IMPLANT
SUT VLOC 180 0 9IN  GS21 (SUTURE) ×2
SUT VLOC 180 0 9IN GS21 (SUTURE) IMPLANT
SYR 50ML LL SCALE MARK (SYRINGE) ×3 IMPLANT
SYRINGE 10CC LL (SYRINGE) ×6 IMPLANT
TIP RUMI ORANGE 6.7MMX12CM (TIP) IMPLANT
TIP UTERINE 5.1X6CM LAV DISP (MISCELLANEOUS) IMPLANT
TIP UTERINE 6.7X10CM GRN DISP (MISCELLANEOUS) ×2 IMPLANT
TIP UTERINE 6.7X6CM WHT DISP (MISCELLANEOUS) IMPLANT
TIP UTERINE 6.7X8CM BLUE DISP (MISCELLANEOUS) IMPLANT
TOWEL OR 17X24 6PK STRL BLUE (TOWEL DISPOSABLE) ×9 IMPLANT
TRENDGUARD 450 HYBRID PRO PACK (MISCELLANEOUS) ×3
TROCAR DISP BLADELESS 8 DVNC (TROCAR) ×1 IMPLANT
TROCAR DISP BLADELESS 8MM (TROCAR) ×2
TROCAR PORT AIRSEAL 5X120 (TROCAR) ×3 IMPLANT
TROCAR Z-THREAD 12X150 (TROCAR) ×3 IMPLANT

## 2017-06-18 NOTE — Progress Notes (Signed)
Patient seen and examined. Consent witnessed and signed. No changes noted. Update completed.Patient ID: Erica Lang, female   DOB: 05/16/1976, 41 y.o.   MRN: [id number]

## 2017-06-18 NOTE — Anesthesia Postprocedure Evaluation (Signed)
Anesthesia Post Note  Patient: Erica Collardalisha D Lang  Procedure(s) Performed: * No procedures listed *     Patient location during evaluation: [facility] Anesthesia Type: General Level of consciousness: awake and sedated Pain management: pain level controlled Vital Signs Assessment: post-procedure vital signs reviewed and stable Respiratory status: patient connected to nasal cannula oxygen Cardiovascular status: stable Postop Assessment: no headache, no backache and no signs of nausea or vomiting Anesthetic complications: no    Last Vitals:  Vitals:   06/18/17 1215 06/18/17 1240  BP: 130/77 125/73  Pulse: 87 83  Resp: 13 17  Temp: 37.2 C 36.7 C    Last Pain:  Vitals:   06/18/17 1240  TempSrc:   PainSc: 3    Pain Goal: Patients Stated Pain Goal: 3 (06/18/17 1240)               Cobie Leidner

## 2017-06-18 NOTE — Op Note (Signed)
NAME:  Erica Lang, Erica Lang                ACCOUNT NO.:  [id number]  MEDICAL RECORD NO.:  [id number]  LOCATION:                                 FACILITY:  PHYSICIAN:  Lenoard Adenichard J. Niza Soderholm, M.D.     DATE OF BIRTH:  DATE OF PROCEDURE: DATE OF DISCHARGE:                              OPERATIVE REPORT   PREOPERATIVE DIAGNOSES:  Refractory dysmenorrhea and menorrhagia.  POSTOPERATIVE DIAGNOSES:  Refractory dysmenorrhea and menorrhagia, possible adenomyosis, left adnexal adhesions to sigmoid colon, enterocele.  PROCEDURES:  Da Vinci assisted total laparoscopic hysterectomy, bilateral salpingectomy, lysis of adnexal adhesions to sigmoid, McCall culdoplasty.  SURGEON:  Lenoard Adenichard J. Jenel Gierke, M.D.  ASSISTANT:  Fredric MareBailey.  ANESTHESIA:  General.  ESTIMATED BLOOD LOSS:  100 mL.  COMPLICATIONS:  None.  DRAINS:  Foley.  COUNTS:  Correct.  DISPOSITION:  The patient to Recovery in good condition.  FINDINGS:  Boggy anteflexed 10- to 12-week size uterus, normal probable polycystic ovaries, normal tubes, sigmoid adhesions to left adnexa, and enterocele.  DESCRIPTION OF PROCEDURE:  After being apprised of risks of anesthesia, infection, bleeding, injury to surrounding organs, possible need for repair, delayed versus immediate complications to include bowel and bladder injury, possible need for repair, the patient was brought to the operating room where she was administered a general anesthetic without complications.  Prepped and draped in usual sterile fashion.  Foley catheter placed.  After achieving adequate anesthesia and Foley catheter placement, RUMI retractor placed vaginally without difficulty.  Exam under anesthesia as noted.  At this time, an infraumbilical incision was made.  Veress needle placed, opening pressure of 2.  Four liters of CO2 insufflated without difficulty.  Trocar placement atraumatically. Pictures were taken.  Normal liver, gallbladder bed, normal appendiceal area.  Findings  as noted above.  At this time, 1 port was made on the right, 8 mm, an 8 mm and a 5 mm on the left.  AirSeal was established and both trocars were placed under direct visualization.  Deep Trendelenburg position was established.  Robot was placed.  PK forceps and Endo Shears were placed.  The ureter was identified bilaterally. The sigmoid adhesions were lysed sharply from adnexa using monopolar cautery and sharp dissection.  The mesosalpinx was scored along the left mesosalpinx and left tube which was then divided along the mesosalpinx. The retroperitoneal space was entered cephalad to the left round ligament and the ovarian ligament on the left is skeletonized, cauterized, and cut.  The round ligament was opened.  The bladder flap was sharply developed, skeletonizing the left uterine vessels which were cauterized, but not cut.  The bladder flap was developed circumferentially to the area of the right uterine artery on the right side.  Mesosalpinx was similarly undermined and divided using monopolar cautery.  The retroperitoneal space was entered.  The right ovarian ligament was skeletonized, divided using bipolar cautery, and sharp dissection.  The round ligament was opened using bipolar monopolar cautery and sharp dissection.  The right uterine vessels were skeletonized, cauterized, and cut.  After complete development of the right aspect of the bladder flap, RUMI cup was seen bulging at the cervicovaginal junction on the left.  Uterine vessels were cauterized  and cut.  Ureters still seen to be peristalsing normally and bilaterally.  The RUMI cup was then scored in a circumferential fashion using monopolar cautery and the specimen was divided and retracted into the vagina without difficulty.  Hemostasis was achieved.  Vaginal cuff was closed using 2 running layers of 0 V-Loc suture and a McCall culdoplasty suture was placed.  The needle was removed through the operative port and CO2 was  released.  Good hemostasis was achieved. Ureters peristalsing normally and bilaterally.  Urine was clear.  At this time, after removal of all ports under direct visualization and undocking of the robot, incisions were closed using 0 Vicryl, 4-0 Vicryl, and Dermabond.  Dilute Marcaine solution was placed.  Dilute ropivacaine solution was placed in the pelvis before removal of those trocars.  Vaginal exam reveals a well-approximated vaginal cuff.  Good hemostasis noted.  The patient tolerated the procedure well, was awakened, and transferred to recovery room in good condition.     Lenoard Aden, M.D.     RJT/MEDQ  D:  06/18/2017  T:  06/18/2017  Job:  [id number]  cc:   Lenoard Aden, M.D. Fax: [phone]

## 2017-06-18 NOTE — Anesthesia Procedure Notes (Signed)
Procedure Name: Intubation Date/Time: 06/18/2017 8:10 AM Performed by: Khushbu Pippen, Jannet AskewHARLESETTA Lang Pre-anesthesia Checklist: Patient identified, Patient being monitored, Emergency Drugs available and Timeout performed Patient Re-evaluated:Patient Re-evaluated prior to inductionOxygen Delivery Method: Circle system utilized Preoxygenation: Pre-oxygenation with 100% oxygen Intubation Type: IV induction Ventilation: Mask ventilation without difficulty Grade View: Grade I Tube type: Oral Tube size: 7.0 mm Number of attempts: 1 Placement Confirmation: ETT inserted through vocal cords under direct vision,  positive ETCO2 and breath sounds checked- equal and bilateral Secured at: 22 cm Dental Injury: Teeth and Oropharynx as per pre-operative assessment

## 2017-06-18 NOTE — Anesthesia Preprocedure Evaluation (Addendum)
Anesthesia Evaluation  Patient identified by MRN, date of birth, ID band Patient awake    Reviewed: Allergy & Precautions, NPO status , Patient's Chart, lab work & pertinent test results  Airway Mallampati: I  TM Distance: >3 FB Neck ROM: Full    Dental  (+) Teeth Intact, Dental Advisory Given   Pulmonary asthma ,    breath sounds clear to auscultation       Cardiovascular hypertension,  Rhythm:Regular Rate:Normal     Neuro/Psych  Headaches, Anxiety  Neuromuscular disease    GI/Hepatic negative GI ROS, Neg liver ROS,   Endo/Other  negative endocrine ROS  Renal/GU negative Renal ROS  negative genitourinary   Musculoskeletal negative musculoskeletal ROS (+)   Abdominal (+) + obese,   Peds negative pediatric ROS (+)  Hematology negative hematology ROS (+)   Anesthesia Other Findings Day of surgery medications reviewed with the patient.  Reproductive/Obstetrics negative OB ROS                            Anesthesia Physical Anesthesia Plan  ASA: II  Anesthesia Plan: General   Post-op Pain Management:    Induction: Intravenous  PONV Risk Score and Plan: 4 or greater and Ondansetron, Dexamethasone, Propofol, Midazolam and Scopolamine patch - Pre-op  Airway Management Planned: Oral ETT  Additional Equipment:   Intra-op Plan:   Post-operative Plan: Extubation in OR  Informed Consent: I have reviewed the patients History and Physical, chart, labs and discussed the procedure including the risks, benefits and alternatives for the proposed anesthesia with the patient or authorized representative who has indicated his/her understanding and acceptance.   Dental advisory given  Plan Discussed with: CRNA  Anesthesia Plan Comments:         Anesthesia Quick Evaluation

## 2017-06-18 NOTE — Transfer of Care (Signed)
Immediate Anesthesia Transfer of Care Note  Patient: Erica Lang  Procedure(s) Performed: Procedure(s): ROBOTIC ASSISTED TOTAL HYSTERECTOMY WITH SALPINGECTOMY (Bilateral)  Patient Location: PACU  Anesthesia Type:General  Level of Consciousness: awake, alert  and oriented  Airway & Oxygen Therapy: Patient Spontanous Breathing and Patient connected to nasal cannula oxygen  Post-op Assessment: Report given to RN and Post -op Vital signs reviewed and stable  Post vital signs: Reviewed and stable  Last Vitals:  Vitals:   06/18/17 0653 06/18/17 1045  BP: 111/76   Pulse: 81 91  Resp: 16   Temp: 36.9 C 36.7 C    Last Pain:  Vitals:   06/18/17 0653  TempSrc: Oral      Patients Stated Pain Goal: 2 (06/18/17 [id number])  Complications: No apparent anesthesia complications

## 2017-06-18 NOTE — Op Note (Signed)
06/18/2017  10:34 AM  PATIENT:  Erica Lang  41 y.o. female  PRE-OPERATIVE DIAGNOSIS:  Menorrhagia, Dysmenorrhea  POST-OPERATIVE DIAGNOSIS:  menorrhagia, dysmenorrhea  PROCEDURE:  Procedure(s): ROBOTIC ASSISTED TOTAL HYSTERECTOMY WITH BILATERAL SALPINGECTOMY LYSIS OF SIGMOID TO LEFT ADNEXAL ADHESIONS MCCALL CUL DE PLASTY  SURGEON:  Surgeon(s): Olivia Mackieaavon, Othmar Ringer, MD  ASSISTANTSFredric Mare: BAILEY, CNM, FACM   ANESTHESIA:   local and general  ESTIMATED BLOOD LOSS: 100  DRAINS: Urinary Catheter (Foley)   LOCAL MEDICATIONS USED:  MARCAINE    and Amount: 25 ml  SPECIMEN:  Source of Specimen:  UTERUS , CERVIX AND TUBES  DISPOSITION OF SPECIMEN:  PATHOLOGY  COUNTS:  YES  DICTATION #: [id number]  PLAN OF CARE: DC IN AM- 23 HR EXTENDED STAY  PATIENT DISPOSITION:  PACU - hemodynamically stable.

## 2017-06-19 ENCOUNTER — Encounter (HOSPITAL_COMMUNITY): Payer: Self-pay | Admitting: Obstetrics and Gynecology

## 2017-06-19 DIAGNOSIS — K660 Peritoneal adhesions (postprocedural) (postinfection): Secondary | ICD-10-CM | POA: Diagnosis not present

## 2017-06-19 DIAGNOSIS — N920 Excessive and frequent menstruation with regular cycle: Secondary | ICD-10-CM | POA: Diagnosis not present

## 2017-06-19 DIAGNOSIS — N946 Dysmenorrhea, unspecified: Secondary | ICD-10-CM | POA: Diagnosis not present

## 2017-06-19 DIAGNOSIS — K469 Unspecified abdominal hernia without obstruction or gangrene: Secondary | ICD-10-CM | POA: Diagnosis not present

## 2017-06-19 LAB — CBC
HCT: 33.8 % — ABNORMAL LOW (ref 36.0–46.0)
HEMOGLOBIN: 11.8 g/dL — AB (ref 12.0–15.0)
MCH: 33.1 pg (ref 26.0–34.0)
MCHC: 34.9 g/dL (ref 30.0–36.0)
MCV: 94.9 fL (ref 78.0–100.0)
PLATELETS: 221 10*3/uL (ref 150–400)
RBC: 3.56 MIL/uL — AB (ref 3.87–5.11)
RDW: 13.1 % (ref 11.5–15.5)
WBC: 12.6 10*3/uL — ABNORMAL HIGH (ref 4.0–10.5)

## 2017-06-19 LAB — BASIC METABOLIC PANEL
Anion gap: 6 (ref 5–15)
BUN: 8 mg/dL (ref 6–20)
CHLORIDE: 106 mmol/L (ref 101–111)
CO2: 25 mmol/L (ref 22–32)
CREATININE: 0.56 mg/dL (ref 0.44–1.00)
Calcium: 8.6 mg/dL — ABNORMAL LOW (ref 8.9–10.3)
Glucose, Bld: 109 mg/dL — ABNORMAL HIGH (ref 65–99)
POTASSIUM: 4 mmol/L (ref 3.5–5.1)
SODIUM: 137 mmol/L (ref 135–145)

## 2017-06-19 MED ORDER — TRAMADOL HCL 50 MG PO TABS: 50.0000 mg | ORAL_TABLET | Freq: Four times a day (QID) | ORAL | 0 refills | Status: DC | PRN

## 2017-06-19 MED ORDER — OXYCODONE-ACETAMINOPHEN 5-325 MG PO TABS: 1.0000 | ORAL_TABLET | ORAL | 0 refills | Status: DC | PRN

## 2017-06-19 NOTE — Progress Notes (Signed)
1 Day Post-Op Procedure(s) (LRB): ROBOTIC ASSISTED TOTAL HYSTERECTOMY WITH SALPINGECTOMY (Bilateral)  Subjective: Patient reports nausea, incisional pain, tolerating PO, + flatus and no problems voiding.    Objective: BP 122/82 (BP Location: Right Arm)   Pulse 81   Temp 98.9 F (37.2 C) (Oral)   Resp 20   Ht 5\' 8"  (1.727 m)   Wt 108.6 kg (239 lb 8 oz)   LMP 06/05/2017   SpO2 100%   BMI 36.42 kg/m   CBC    Component Value Date/Time   WBC 12.6 (H) 06/19/2017 0556   RBC 3.56 (L) 06/19/2017 0556   HGB 11.8 (L) 06/19/2017 0556   HCT 33.8 (L) 06/19/2017 0556   PLT 221 06/19/2017 0556   MCV 94.9 06/19/2017 0556   MCH 33.1 06/19/2017 0556   MCHC 34.9 06/19/2017 0556   RDW 13.1 06/19/2017 0556   LYMPHSABS 1.7 05/30/2016 0309   MONOABS 1.1 (H) 05/30/2016 0309   EOSABS 0.0 05/30/2016 0309   BASOSABS 0.0 05/30/2016 0309    I have reviewed patient's vital signs, intake and output, medications and labs.  General: alert, cooperative and appears stated age Resp: clear to auscultation bilaterally Cardio: regular rate and rhythm, S1, S2 normal, no murmur, click, rub or gallop GI: soft, non-tender; bowel sounds normal; no masses,  no organomegaly and incision: clean, dry and intact Extremities: extremities normal, atraumatic, no cyanosis or edema and Homans sign is negative, no sign of DVT Vaginal Bleeding: minimal  Assessment: s/p Procedure(s): ROBOTIC ASSISTED TOTAL HYSTERECTOMY WITH SALPINGECTOMY (Bilateral): stable, progressing well and tolerating diet  Plan: Advance diet Encourage ambulation Advance to PO medication Discontinue IV fluids Discharge home  LOS: 0 days    Erica Lang J 06/19/2017, 9:07 AM

## 2017-06-19 NOTE — Progress Notes (Signed)
GYN Discharge Summary     Patient Name: Erica Lang DOB: 1976/06/08 MRN: [id number]  Date of admission: 06/18/2017 Delivering MD: This patient has no babies on file.  Date of discharge: 06/19/2017  Admitting diagnosis: Menorrhagia, Dysmenorrhea Intrauterine pregnancy: Unknown     Secondary diagnosis:  Active Problems:   Dysmenorrhea     Discharge diagnosis: Robotic Hysterectomy                                                                                                  Physical exam  Vitals:   06/18/17 2332 06/19/17 0158 06/19/17 0355 06/19/17 0800  BP: 128/66  100/60 122/82  Pulse: 80  67 81  Resp: 17 15 (!) 25 20  Temp: 98.4 F (36.9 C)  98.5 F (36.9 C) 98.9 F (37.2 C)  TempSrc: Oral  Oral Oral  SpO2: 100% 99% 97% 100%  Weight:      Height:       General: Alert, Oriented X4, Pain under control Incision: Clean, Approximated, No Drainage Labs: Lab Results  Component Value Date   WBC 12.6 (H) 06/19/2017   HGB 11.8 (L) 06/19/2017   HCT 33.8 (L) 06/19/2017   MCV 94.9 06/19/2017   PLT 221 06/19/2017   CMP Latest Ref Rng & Units 06/19/2017  Glucose 65 - 99 mg/dL 782(N109(H)  BUN 6 - 20 mg/dL 8  Creatinine 5.620.44 - 1.301.00 mg/dL 8.650.56  Sodium 784135 - 696145 mmol/L 137  Potassium 3.5 - 5.1 mmol/L 4.0  Chloride 101 - 111 mmol/L 106  CO2 22 - 32 mmol/L 25  Calcium 8.9 - 10.3 mg/dL 2.9(B8.6(L)  Total Protein 6.5 - 8.1 g/dL -  Total Bilirubin 0.3 - 1.2 mg/dL -  Alkaline Phos 38 - 284126 U/L -  AST 15 - 41 U/L -  ALT 14 - 54 U/L -    Discharge instruction: per After Visit Summary   After visit meds:  Allergies as of 06/19/2017      Reactions   Bee Venom Anaphylaxis      Medication List    TAKE these medications   albuterol 108 (90 Base) MCG/ACT inhaler Commonly known as:  PROVENTIL HFA;VENTOLIN HFA Inhale 2 puffs into the lungs every 6 (six) hours as needed for wheezing.   Biotin 1324410000 MCG Tbdp Take 10,000 mcg by mouth daily.   CALCIUM 600 + D PO Take 1 tablet by  mouth daily.   calcium carbonate 500 MG chewable tablet Commonly known as:  TUMS - dosed in mg elemental calcium Chew 1 tablet by mouth daily as needed for indigestion or heartburn.   cefpodoxime 200 MG tablet Commonly known as:  VANTIN Take 1 tablet (200 mg total) by mouth 2 (two) times daily.   EPINEPHrine 0.3 mg/0.3 mL Devi Commonly known as:  EPI-PEN Inject 0.3 mLs (0.3 mg total) into the muscle once.   ferrous sulfate 325 (65 FE) MG EC tablet Take 325 mg by mouth at bedtime.   medroxyPROGESTERone 150 MG/ML injection Commonly known as:  DEPO-PROVERA Inject 150 mg into the muscle every 3 (three) months. Depo due  6/18-20/2018   Melatonin 10 MG Tabs Take 10 mg by mouth at bedtime as needed (sleep).   oxyCODONE-acetaminophen 5-325 MG tablet Commonly known as:  ROXICET Take 1-2 tablets by mouth every 4 (four) hours as needed for severe pain.   Potassium 99 MG Tabs Take 99 mg by mouth daily.   traMADol 50 MG tablet Commonly known as:  ULTRAM Take 1-2 tablets (50-100 mg total) by mouth every 6 (six) hours as needed for moderate pain.       Diet: Regular   Activity: Advance as tolerated. Pelvic rest for 6 weeks.   Outpatient follow up:3 weeks with Dr Billy Coast   Discharge instructions reviewed including incision care, signs and symptoms of infection, signs of complications, when to come back to the hospital, and questions answered. Pt is aware that she needs to follow up with DR. Taavon in 3 weeks per her discussion with him. Prescriptions delivered to patient,  IV removed, pt showered and voided prior to discharge. Pt discharged via wheelchair escorted by significant other and nurse tech.      06/19/2017 Alvin Critchley, RN

## 2017-06-19 NOTE — Anesthesia Postprocedure Evaluation (Signed)
Anesthesia Post Note  Patient: Arvin Collardalisha D Lienau  Procedure(s) Performed: Procedure(s) (LRB): ROBOTIC ASSISTED TOTAL HYSTERECTOMY WITH SALPINGECTOMY (Bilateral)     Patient location during evaluation: PACU Anesthesia Type: General Level of consciousness: awake and alert Pain management: pain level controlled Vital Signs Assessment: post-procedure vital signs reviewed and stable Respiratory status: spontaneous breathing, nonlabored ventilation, respiratory function stable and patient connected to nasal cannula oxygen Cardiovascular status: blood pressure returned to baseline and stable Postop Assessment: no signs of nausea or vomiting Anesthetic complications: no    Last Vitals:  Vitals:   06/19/17 0355 06/19/17 0800  BP: 100/60 122/82  Pulse: 67 81  Resp: (!) 25 20  Temp: 36.9 C 37.2 C    Last Pain:  Vitals:   06/19/17 0900  TempSrc:   PainSc: 2                  Shelton SilvasKevin D Karmel Patricelli

## 2017-08-25 DIAGNOSIS — N760 Acute vaginitis: Secondary | ICD-10-CM | POA: Diagnosis not present

## 2017-08-25 DIAGNOSIS — R300 Dysuria: Secondary | ICD-10-CM | POA: Diagnosis not present

## 2018-01-25 ENCOUNTER — Ambulatory Visit (INDEPENDENT_AMBULATORY_CARE_PROVIDER_SITE_OTHER): Payer: BLUE CROSS/BLUE SHIELD | Admitting: Physician Assistant

## 2018-01-25 ENCOUNTER — Other Ambulatory Visit: Payer: Self-pay

## 2018-01-25 ENCOUNTER — Encounter: Payer: Self-pay | Admitting: Physician Assistant

## 2018-01-25 VITALS — BP 155/97 | HR 103 | Temp 99.5°F | Resp 16 | Ht 68.0 in | Wt 241.0 lb

## 2018-01-25 DIAGNOSIS — R52 Pain, unspecified: Secondary | ICD-10-CM | POA: Diagnosis not present

## 2018-01-25 DIAGNOSIS — B349 Viral infection, unspecified: Secondary | ICD-10-CM

## 2018-01-25 DIAGNOSIS — S46912A Strain of unspecified muscle, fascia and tendon at shoulder and upper arm level, left arm, initial encounter: Secondary | ICD-10-CM

## 2018-01-25 DIAGNOSIS — R110 Nausea: Secondary | ICD-10-CM | POA: Diagnosis not present

## 2018-01-25 LAB — POCT URINALYSIS DIP (MANUAL ENTRY)
BILIRUBIN UA: NEGATIVE mg/dL
Bilirubin, UA: NEGATIVE
Blood, UA: NEGATIVE
GLUCOSE UA: NEGATIVE mg/dL
LEUKOCYTES UA: NEGATIVE
Nitrite, UA: NEGATIVE
Protein Ur, POC: NEGATIVE mg/dL
Spec Grav, UA: 1.015 (ref 1.010–1.025)
UROBILINOGEN UA: 0.2 U/dL
pH, UA: 7 (ref 5.0–8.0)

## 2018-01-25 LAB — POCT INFLUENZA A/B
Influenza A, POC: NEGATIVE
Influenza B, POC: NEGATIVE

## 2018-01-25 MED ORDER — ONDANSETRON 8 MG PO TBDP: 8.0000 mg | ORAL_TABLET | Freq: Three times a day (TID) | ORAL | 0 refills | Status: DC | PRN

## 2018-01-25 NOTE — Progress Notes (Signed)
PRIMARY CARE AT [facility] [address], [place] [address] [phone]  Date:  01/25/2018   Name:  Erica Lang   DOB:  08-Sep-1976   MRN:  [id number]  PCP:  Patient, No Pcp Per    History of Present Illness:  Erica Lang is a 42 y.o. female patient who presents to PCP with  Chief Complaint  Patient presents with  . Generalized Body Aches    x 4days  . Chills    vomiting and nausea/ not today but started friday  . Headache    x 4 days  . Shoulder Pain    pt had shoulder injury in the past, but still has pain  4 days ago, body aches, chills, headaches. Nausea and vomiting.  She took kaopeptate.  Temperature of 101.3.  She had temperature.  Sore throat.  Nasal congestion. Sob or dyspnea.  Left ear discomfort, but no drainage.  She had no flu vaccine this year.    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  This pain increased over a day.  She was seen by a worker's comp office.  She was given the treatment below for left shoulder strain.  Shoulder xray impression states degenerative spurring at the [facility] joint.  No fracture or acute findings.   She has been icing it three times per day for 10-15 minutes. She has taken the naproxen, and muscle relaxant as prescribed.   This has helped very little.  She has no new paresthesia.  She has noticed some shoulder swelling.   She has never had this shoulder pain before.  This is a new injury.  She is left shoulder dominant.   Patient Active Problem List   Diagnosis Date Noted  . Pyelonephritis 05/29/2016  . Sepsis (HCC) 05/29/2016  . Acute UTI 05/29/2016  . Headache   . Chest pain 11/01/2013  . Anemia   . Anxiety   . Hypertension   . Hidradenitis   . Obesity   . PCOS (polycystic ovarian syndrome)   . Asthma   . 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 Hx of varicella   . Persistent headaches 10/30/2013  . Left sided  numbness 10/29/2013  . Postpartum hypertension 10/19/2013  . Postpartum edema 10/19/2013  . Dysmenorrhea 10/28/2007  . DYSFUNCTIONAL UTERINE BLEEDING 10/28/2007  . POLYCYSTIC OVARIAN DISEASE 05/03/1999    Past Medical History:  Diagnosis Date  . Anemia   . Anxiety   . Bilateral carpal tunnel syndrome    left only per patient  . Headache   . History of abnormal cervical Pap smear   . History of exercise stress test dr Eden Emms   11-11-2013--  normal/  good exercise capacity, no chest pain, no ischemia  . History of pre-eclampsia 11/ 2014   hypertension and postpartum pulmonary edema--  resolved  . IBS (irritable bowel syndrome)   . Mild asthma   . PCOS (polycystic ovarian syndrome)     Past Surgical History:  Procedure Laterality Date  . CARPAL TUNNEL RELEASE Right 08/02/2015   Procedure: RIGHT HAND CARPAL TUNNEL RELEASE;  Surgeon: Bradly Bienenstock, MD;  Location: [facility];  Service: Orthopedics;  Laterality: Right;  . CHOLECYSTECTOMY N/A 10/27/2014   Procedure: LAPAROSCOPIC CHOLECYSTECTOMY WITH INTRAOPERATIVE CHOLANGIOGRAM;  Surgeon: Atilano Ina, MD;  Location: [facility];  Service: General;  Laterality: N/A;  .  dx laparoscopy    . I & D  LEFT VULVAR HEMATOMA, POSTPARTUM  07-05-2009  . ROBOTIC ASSISTED TOTAL HYSTERECTOMY WITH SALPINGECTOMY Bilateral 06/18/2017   Procedure: ROBOTIC ASSISTED TOTAL HYSTERECTOMY WITH SALPINGECTOMY;  Surgeon: Olivia Mackie, MD;  Location: [facility];  Service: Gynecology;  Laterality: Bilateral;  . TRANSTHORACIC ECHOCARDIOGRAM  10-28-2013   normal LVF,  ef 60-65%,  mild AR , MR and , TR    Social History   Tobacco Use  . Smoking status: Never Smoker  . Smokeless tobacco: Never Used  Substance Use Topics  . Alcohol use: Yes    Alcohol/week: 0.0 oz    Comment: occasional  . Drug use: No    Family History  Problem Relation Age of Onset  . Stroke Mother   . Hypertension Mother   . Heart disease Mother   . Deep vein thrombosis  Mother        both legs, arms and back, 2 blood clots migrted to her chest area  . Diabetes Mother     Allergies  Allergen Reactions  . Bee Venom Anaphylaxis    Medication list has been reviewed and updated.  Current Outpatient Medications on File Prior to Visit  Medication Sig Dispense Refill  . albuterol (PROVENTIL HFA;VENTOLIN HFA) 108 (90 BASE) MCG/ACT inhaler Inhale 2 puffs into the lungs every 6 (six) hours as needed for wheezing. 1 Inhaler 0  . Biotin 29562 MCG TBDP Take 10,000 mcg by mouth daily.    . Calcium Carb-Cholecalciferol (CALCIUM 600 + D PO) Take 1 tablet by mouth daily.    . calcium carbonate (TUMS - DOSED IN MG ELEMENTAL CALCIUM) 500 MG chewable tablet Chew 1 tablet by mouth daily as needed for indigestion or heartburn.    . ferrous sulfate 325 (65 FE) MG EC tablet Take 325 mg by mouth at bedtime.    . Melatonin 10 MG TABS Take 10 mg by mouth at bedtime as needed (sleep).    . traMADol (ULTRAM) 50 MG tablet Take 1-2 tablets (50-100 mg total) by mouth every 6 (six) hours as needed for moderate pain. 30 tablet 0  . cefpodoxime (VANTIN) 200 MG tablet Take 1 tablet (200 mg total) by mouth 2 (two) times daily. (Patient not taking: Reported on 06/10/2017) 24 tablet 0  . EPINEPHrine (EPI-PEN) 0.3 mg/0.3 mL DEVI Inject 0.3 mLs (0.3 mg total) into the muscle once. (Patient not taking: Reported on 05/29/2016) 1 Device 0  . medroxyPROGESTERone (DEPO-PROVERA) 150 MG/ML injection Inject 150 mg into the muscle every 3 (three) months. Depo due 6/18-20/2018    . oxyCODONE-acetaminophen (ROXICET) 5-325 MG tablet Take 1-2 tablets by mouth every 4 (four) hours as needed for severe pain. (Patient not taking: Reported on 01/25/2018) 30 tablet 0  . Potassium 99 MG TABS Take 99 mg by mouth daily.     No current facility-administered medications on file prior to visit.     ROS ROS otherwise unremarkable unless listed above.  Physical Examination: BP (!) 155/97   Pulse (!) 103   Temp 99.5  F (37.5 C) (Oral)   Resp 16   Ht 5\' 8"  (1.727 m)   Wt 241 lb (109.3 kg)   LMP 06/05/2017   SpO2 100%   BMI 36.64 kg/m  Ideal Body Weight: Weight in (lb) to have BMI = 25: 164.1  Physical Exam  Constitutional: She is oriented to person, place, and time. She appears well-developed and well-nourished. No distress.  HENT:  Head: Normocephalic and atraumatic.  Right  Ear: Tympanic membrane, external ear and ear canal normal.  Left Ear: Tympanic membrane, external ear and ear canal normal.  Nose: No mucosal edema or rhinorrhea. Right sinus exhibits no maxillary sinus tenderness and no frontal sinus tenderness. Left sinus exhibits no maxillary sinus tenderness and no frontal sinus tenderness.  Mouth/Throat: No uvula swelling. No oropharyngeal exudate, posterior oropharyngeal edema or posterior oropharyngeal erythema.  Eyes: Conjunctivae and EOM are normal. Pupils are equal, round, and reactive to light.  Cardiovascular: Normal rate and regular rhythm. Exam reveals no gallop, no distant heart sounds and no friction rub.  No murmur heard. Pulmonary/Chest: Effort normal. No respiratory distress. She has no decreased breath sounds. She has no wheezes. She has no rhonchi.  Musculoskeletal:       Right shoulder: She exhibits decreased range of motion (decreased rom of abduction and external rotation), tenderness (tender to the bicipital groove) and bony tenderness (tender at the ac joint). She exhibits no spasm and normal pulse.  Lymphadenopathy:       Head (right side): No submandibular, no tonsillar, no preauricular and no posterior auricular adenopathy present.       Head (left side): No submandibular, no tonsillar, no preauricular and no posterior auricular adenopathy present.  Neurological: She is alert and oriented to person, place, and time.  Skin: She is not diaphoretic.  Psychiatric: She has a normal mood and affect. Her behavior is normal.   Results for orders placed or performed in  visit on 01/25/18  POCT Influenza A/B  Result Value Ref Range   Influenza A, POC Negative Negative   Influenza B, POC Negative Negative  POCT urinalysis dipstick  Result Value Ref Range   Color, UA yellow yellow   Clarity, UA clear clear   Glucose, UA negative negative mg/dL   Bilirubin, UA negative negative   Ketones, POC UA negative negative mg/dL   Spec Grav, UA [id number] - 1.025   Blood, UA negative negative   pH, UA 7.0 5.0 - 8.0   Protein Ur, POC negative negative mg/dL   Urobilinogen, UA 0.2 0.2 or 1.0 E.U./dL   Nitrite, UA Negative Negative   Leukocytes, UA Negative Negative     Assessment and Plan: Erica Lang is a 42 y.o. female who is here today for cc of  Chief Complaint  Patient presents with  . Generalized Body Aches    x 4days  . Chills    vomiting and nausea/ not today but started friday  . Headache    x 4 days  . Shoulder Pain    pt had shoulder injury in the past, but still has pain  viral in appearance.  Treating supportively.  Advised anti-pyretic use and switching to ibuprofen at this time for virus.  Not advantageous given illness to treat the shoulder pain with a steroid at this time Advised to continue icing, msk relaxant, and the nsaid use.  Given stretches to perform at this time.  Ice directly after.  May follow up in 2 weeks for this work related illness.  At that time, and may consider steroid injection without improvement.  Physical therapy can be pursued at that time.   Imaging result obtained and photocopied for scanning Viral infection  Generalized body aches - Plan: POCT Influenza A/B, POCT urinalysis dipstick, ondansetron (ZOFRAN-ODT) 8 MG disintegrating tablet  Strain of left shoulder, initial encounter  Nausea without vomiting - Plan: ondansetron (ZOFRAN-ODT) 8 MG disintegrating tablet  Trena PlattStephanie English, PA-C [facility]  [facility] 1/29/201911:06 AM

## 2018-01-25 NOTE — Patient Instructions (Addendum)
Please continue to ice the shoulder three times per day for 15 minutes.   Continue to take your naproxen as prescribed.  If it is twice per day (500mg ) then do it twice per day.  Take with food. I would like you to do these small stretches. If they are unable to give you the modifications, then you can seek fmla.   This appears to be a virus.  Please take the naproxen, or you can switch to ibuprofen 800mg  every 8 hours, but STOP the naproxen. Hydrate well.  Shoulder Exercises Ask your health care provider which exercises are safe for you. Do exercises exactly as told by your health care provider and adjust them as directed. It is normal to feel mild stretching, pulling, tightness, or discomfort as you do these exercises, but you should stop right away if you feel sudden pain or your pain gets worse.Do not begin these exercises until told by your health care provider. RANGE OF MOTION EXERCISES These exercises warm up your muscles and joints and improve the movement and flexibility of your shoulder. These exercises also help to relieve pain, numbness, and tingling. These exercises involve stretching your injured shoulder directly. Exercise A: Pendulum  1. Stand near a wall or a surface that you can hold onto for balance. 2. Bend at the waist and let your left / right arm hang straight down. Use your other arm to support you. Keep your back straight and do not lock your knees. 3. Relax your left / right arm and shoulder muscles, and move your hips and your trunk so your left / right arm swings freely. Your arm should swing because of the motion of your body, not because you are using your arm or shoulder muscles. 4. Keep moving your body so your arm swings in the following directions, as told by your health care provider: ? Side to side. ? Forward and backward. ? In clockwise and counterclockwise circles. 5. Continue each motion for __________ seconds, or for as long as told by your health care  provider. 6. Slowly return to the starting position. Repeat __________ times. Complete this exercise __________ times a day. Exercise B:Flexion, Standing  1. Stand and hold a broomstick, a cane, or a similar object. Place your hands a little more than shoulder-width apart on the object. Your left / right hand should be palm-up, and your other hand should be palm-down. 2. Keep your elbow straight and keep your shoulder muscles relaxed. Push the stick down with your healthy arm to raise your left / right arm in front of your body, and then over your head until you feel a stretch in your shoulder. ? Avoid shrugging your shoulder while you raise your arm. Keep your shoulder blade tucked down toward the middle of your back. 3. Hold for __________ seconds. 4. Slowly return to the starting position. Repeat __________ times. Complete this exercise __________ times a day. Exercise C: Abduction, Standing 1. Stand and hold a broomstick, a cane, or a similar object. Place your hands a little more than shoulder-width apart on the object. Your left / right hand should be palm-up, and your other hand should be palm-down. 2. While keeping your elbow straight and your shoulder muscles relaxed, push the stick across your body toward your left / right side. Raise your left / right arm to the side of your body and then over your head until you feel a stretch in your shoulder. ? Do not raise your arm above shoulder  height, unless your health care provider tells you to do that. ? Avoid shrugging your shoulder while you raise your arm. Keep your shoulder blade tucked down toward the middle of your back. 3. Hold for __________ seconds. 4. Slowly return to the starting position. Repeat __________ times. Complete this exercise __________ times a day. Exercise D:Internal Rotation  1. Place your left / right hand behind your back, palm-up. 2. Use your other hand to dangle an exercise band, a towel, or a similar object  over your shoulder. Grasp the band with your left / right hand so you are holding onto both ends. 3. Gently pull up on the band until you feel a stretch in the front of your left / right shoulder. ? Avoid shrugging your shoulder while you raise your arm. Keep your shoulder blade tucked down toward the middle of your back. 4. Hold for __________ seconds. 5. Release the stretch by letting go of the band and lowering your hands. Repeat __________ times. Complete this exercise __________ times a day. STRETCHING EXERCISES These exercises warm up your muscles and joints and improve the movement and flexibility of your shoulder. These exercises also help to relieve pain, numbness, and tingling. These exercises are done using your healthy shoulder to help stretch the muscles of your injured shoulder. Exercise E: Research officer, political party (External Rotation and Abduction)  1. Stand in a doorway with one of your feet slightly in front of the other. This is called a staggered stance. If you cannot reach your forearms to the door frame, stand facing a corner of a room. 2. Choose one of the following positions as told by your health care provider: ? Place your hands and forearms on the door frame above your head. ? Place your hands and forearms on the door frame at the height of your head. ? Place your hands on the door frame at the height of your elbows. 3. Slowly move your weight onto your front foot until you feel a stretch across your chest and in the front of your shoulders. Keep your head and chest upright and keep your abdominal muscles tight. 4. Hold for __________ seconds. 5. To release the stretch, shift your weight to your back foot. Repeat __________ times. Complete this stretch __________ times a day. Exercise F:Extension, Standing 1. Stand and hold a broomstick, a cane, or a similar object behind your back. ? Your hands should be a little wider than shoulder-width apart. ? Your palms should face away  from your back. 2. Keeping your elbows straight and keeping your shoulder muscles relaxed, move the stick away from your body until you feel a stretch in your shoulder. ? Avoid shrugging your shoulders while you move the stick. Keep your shoulder blade tucked down toward the middle of your back. 3. Hold for __________ seconds. 4. Slowly return to the starting position. Repeat __________ times. Complete this exercise __________ times a day. STRENGTHENING EXERCISES These exercises build strength and endurance in your shoulder. Endurance is the ability to use your muscles for a long time, even after they get tired. Exercise G:External Rotation  1. Sit in a stable chair without armrests. 2. Secure an exercise band at elbow height on your left / right side. 3. Place a soft object, such as a folded towel or a small pillow, between your left / right upper arm and your body to move your elbow a few inches away (about 10 cm) from your side. 4. Hold the end of the band  so it is tight and there is no slack. 5. Keeping your elbow pressed against the soft object, move your left / right forearm out, away from your abdomen. Keep your body steady so only your forearm moves. 6. Hold for __________ seconds. 7. Slowly return to the starting position. Repeat __________ times. Complete this exercise __________ times a day. Exercise H:Shoulder Abduction  1. Sit in a stable chair without armrests, or stand. 2. Hold a __________ weight in your left / right hand, or hold an exercise band with both hands. 3. Start with your arms straight down and your left / right palm facing in, toward your body. 4. Slowly lift your left / right hand out to your side. Do not lift your hand above shoulder height unless your health care provider tells you that this is safe. ? Keep your arms straight. ? Avoid shrugging your shoulder while you do this movement. Keep your shoulder blade tucked down toward the middle of your  back. 5. Hold for __________ seconds. 6. Slowly lower your arm, and return to the starting position. Repeat __________ times. Complete this exercise __________ times a day. Exercise I:Shoulder Extension 1. Sit in a stable chair without armrests, or stand. 2. Secure an exercise band to a stable object in front of you where it is at shoulder height. 3. Hold one end of the exercise band in each hand. Your palms should face each other. 4. Straighten your elbows and lift your hands up to shoulder height. 5. Step back, away from the secured end of the exercise band, until the band is tight and there is no slack. 6. Squeeze your shoulder blades together as you pull your hands down to the sides of your thighs. Stop when your hands are straight down by your sides. Do not let your hands go behind your body. 7. Hold for __________ seconds. 8. Slowly return to the starting position. Repeat __________ times. Complete this exercise __________ times a day. Exercise J:Standing Shoulder Row 1. Sit in a stable chair without armrests, or stand. 2. Secure an exercise band to a stable object in front of you so it is at waist height. 3. Hold one end of the exercise band in each hand. Your palms should be in a thumbs-up position. 4. Bend each of your elbows to an "L" shape (about 90 degrees) and keep your upper arms at your sides. 5. Step back until the band is tight and there is no slack. 6. Slowly pull your elbows back behind you. 7. Hold for __________ seconds. 8. Slowly return to the starting position. Repeat __________ times. Complete this exercise __________ times a day. Exercise K:Shoulder Press-Ups  1. Sit in a stable chair that has armrests. Sit upright, with your feet flat on the floor. 2. Put your hands on the armrests so your elbows are bent and your fingers are pointing forward. Your hands should be about even with the sides of your body. 3. Push down on the armrests and use your arms to lift  yourself off of the chair. Straighten your elbows and lift yourself up as much as you comfortably can. ? Move your shoulder blades down, and avoid letting your shoulders move up toward your ears. ? Keep your feet on the ground. As you get stronger, your feet should support less of your body weight as you lift yourself up. 4. Hold for __________ seconds. 5. Slowly lower yourself back into the chair. Repeat __________ times. Complete this exercise __________ times a  day. Exercise L: Wall Push-Ups  1. Stand so you are facing a stable wall. Your feet should be about one arm-length away from the wall. 2. Lean forward and place your palms on the wall at shoulder height. 3. Keep your feet flat on the floor as you bend your elbows and lean forward toward the wall. 4. Hold for __________ seconds. 5. Straighten your elbows to push yourself back to the starting position. Repeat __________ times. Complete this exercise __________ times a day. This information is not intended to replace advice given to you by your health care provider. Make sure you discuss any questions you have with your health care provider. Document Released: 10/29/2005 Document Revised: 09/08/2016 Document Reviewed: 08/26/2015 Elsevier Interactive Patient Education  2018 [employer]   Viral Gastroenteritis, Adult Viral gastroenteritis is also known as the stomach flu. This condition is caused by certain germs (viruses). These germs can be passed from person to person very easily (are very contagious). This condition can cause sudden watery poop (diarrhea), fever, and throwing up (vomiting). Having watery poop and throwing up can make you feel weak and cause you to get dehydrated. Dehydration can make you tired and thirsty, make you have a dry mouth, and make it so you pee (urinate) less often. Older adults and people with other diseases or a weak defense system (immune system) are at higher risk for dehydration. It is important to  replace the fluids that you lose from having watery poop and throwing up. Follow these instructions at home: Follow instructions from your doctor about how to care for yourself at home. Eating and drinking  Follow these instructions as told by your doctor:  Take an oral rehydration solution (ORS). This is a drink that is sold at pharmacies and stores.  Drink clear fluids in small amounts as you are able, such as: ? Water. ? Ice chips. ? Diluted fruit juice. ? Low-calorie sports drinks.  Eat bland, easy-to-digest foods in small amounts as you are able, such as: ? Bananas. ? Applesauce. ? Rice. ? Low-fat (lean) meats. ? Toast. ? Crackers.  Avoid fluids that have a lot of sugar or caffeine in them.  Avoid alcohol.  Avoid spicy or fatty foods.  General instructions  Drink enough fluid to keep your pee (urine) clear or pale yellow.  Wash your hands often. If you cannot use soap and water, use hand sanitizer.  Make sure that all people in your home wash their hands well and often.  Rest at home while you get better.  Take over-the-counter and prescription medicines only as told by your doctor.  Watch your condition for any changes.  Take a warm bath to help with any burning or pain from having watery poop.  Keep all follow-up visits as told by your doctor. This is important. Contact a doctor if:  You cannot keep fluids down.  Your symptoms get worse.  You have new symptoms.  You feel light-headed or dizzy.  You have muscle cramps. Get help right away if:  You have chest pain.  You feel very weak or you pass out (faint).  You see blood in your throw-up.  Your throw-up looks like coffee grounds.  You have bloody or black poop (stools) or poop that look like tar.  You have a very bad headache, a stiff neck, or both.  You have a rash.  You have very bad pain, cramping, or bloating in your belly (abdomen).  You have trouble breathing.  You are  breathing very quickly.  Your heart is beating very quickly.  Your skin feels cold and clammy.  You feel confused.  You have pain when you pee.  You have signs of dehydration, such as: ? Dark pee, hardly any pee, or no pee. ? Cracked lips. ? Dry mouth. ? Sunken eyes. ? Sleepiness. ? Weakness. This information is not intended to replace advice given to you by your health care provider. Make sure you discuss any questions you have with your health care provider. Document Released: 06/02/2008 Document Revised: 07/04/2016 Document Reviewed: 08/21/2015 Elsevier Interactive Patient Education  2017 [employer].   IF you received an x-ray today, you will receive an invoice from [facility]. Please contact [facility] at [phone] with questions or concerns regarding your invoice.   IF you received labwork today, you will receive an invoice from [facility]. Please contact [facility] at [phone] with questions or concerns regarding your invoice.   Our billing staff will not be able to assist you with questions regarding bills from these companies.  You will be contacted with the lab results as soon as they are available. The fastest way to get your results is to activate your My Chart account. Instructions are located on the last page of this paperwork. If you have not heard from [name] regarding the results in 2 weeks, please contact this office.

## 2018-02-01 ENCOUNTER — Telehealth: Payer: Self-pay | Admitting: Physician Assistant

## 2018-02-01 NOTE — Telephone Encounter (Unsigned)
Copied from CRM [id number]. >> Feb 01, 2018  5:01 PM Raquel SarnaHayes, Teresa G wrote: Cyclobenzaprine 10 mg  Pt spoke w/ Dr. Lenox PondsEnglish.  Dr said to let her know if she wants to refill the Rx above.  She needs it refilled.  [id number] [place], [place] - [address] [place] [address] Phone: [phone] Fax: [phone] Not a 24 hour pharmacy; exact hours not known

## 2018-02-01 NOTE — Telephone Encounter (Unsigned)
Copied from CRM [id number]. Topic: General - Other >> Feb 01, 2018  5:01 PM Raquel SarnaHayes, Erica G wrote: Cyclobenzaprine 10 mg  Pt spoke w/ Dr. Lenox PondsEnglish.  Dr said to let her know if she wants to refill the Rx above.  She needs it refilled.  [id number]- [place], [place] - [address] [place] [address] Phone: [phone] Fax: [phone] Not a 24 hour pharmacy; exact hours not known

## 2018-02-01 NOTE — Telephone Encounter (Signed)
Cyclobenzaprine 10 mg refill Last Refill:Not on med list [facility] [phone]

## 2018-02-01 NOTE — Telephone Encounter (Signed)
Patient brought in both FMLA forms and short term disability forms to be completed by [name] for her last OV on 01/25/18 where she had a left shoulder injury. I completed what I could from the OV notes and highlighted what needs to be finished. I will place the forms in Erica's box on 02/01/18 please return to the FMLA/Disability box within 5-7 business days thank you!   There are a lot of forms please check every page for highlighted areas. Thank you!

## 2018-02-02 MED ORDER — CYCLOBENZAPRINE HCL 10 MG PO TABS: 5.0000 mg | ORAL_TABLET | Freq: Three times a day (TID) | ORAL | 0 refills | Status: DC | PRN

## 2018-02-02 NOTE — Telephone Encounter (Signed)
Please advise 

## 2018-02-02 NOTE — Telephone Encounter (Signed)
Sent to pharmacy.  Advise patient

## 2018-02-08 ENCOUNTER — Ambulatory Visit (INDEPENDENT_AMBULATORY_CARE_PROVIDER_SITE_OTHER): Payer: BLUE CROSS/BLUE SHIELD | Admitting: Physician Assistant

## 2018-02-08 VITALS — BP 132/80 | HR 107 | Temp 99.8°F | Resp 16 | Ht 68.0 in | Wt 240.0 lb

## 2018-02-08 DIAGNOSIS — S46912A Strain of unspecified muscle, fascia and tendon at shoulder and upper arm level, left arm, initial encounter: Secondary | ICD-10-CM

## 2018-02-08 NOTE — Patient Instructions (Addendum)
This is improving.  Continue to do the stretches, and icing.   Let's follow up in 2 weeks.       IF you received an x-ray today, [facility] will [facility]. Please contact [facility] at [phone] with questions or concerns regarding your invoice.   IF you received labwork today, you will receive an invoice from [facility]. Please contact [facility] at [phone] with questions or concerns regarding your invoice.   Our billing staff will not be able to assist you with questions regarding bills from these companies.  You will be contacted with the lab results as soon as they are available. The fastest way to get your results is to activate your My Chart account. Instructions are located on the last page of this paperwork. If you have not heard from us regarding the results in 2 weeks, please contact this office.

## 2018-02-08 NOTE — Progress Notes (Signed)
PRIMARY CARE AT [facility] [address] [place] [address] [phone]  Date:  02/08/2018   Name:  Erica Lang   DOB:  05/01/1976   MRN:  [id number]  PCP:  Patient, No Pcp Per    History of Present Illness:  Erica Lang is a 42 y.o. female patient who presents to PCP with  Chief Complaint  Patient presents with  . Shoulder Pain    wc/ follow up  . Nausea    follow up     The arm can extend more now, but she still has some pain with lifting her arm up.  Pain with reaching up and doing her hair.  No numbness or tingling.  She is performing her stretches.  She continues to ice directly after.   Patient Active Problem List   Diagnosis Date Noted  . Pyelonephritis 05/29/2016  . Sepsis (HCC) 05/29/2016  . Acute UTI 05/29/2016  . Headache   . Chest pain 11/01/2013  . Anemia   . Anxiety   . Hypertension   . Hidradenitis   . Obesity   . PCOS (polycystic ovarian syndrome)   . Asthma   . 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 Hx of varicella   . Persistent headaches 10/30/2013  . Left sided numbness 10/29/2013  . Postpartum hypertension 10/19/2013  . Postpartum edema 10/19/2013  . Dysmenorrhea 10/28/2007  . DYSFUNCTIONAL UTERINE BLEEDING 10/28/2007  . POLYCYSTIC OVARIAN DISEASE 05/03/1999    Past Medical History:  Diagnosis Date  . Anemia   . Anxiety   . Bilateral carpal tunnel syndrome    left only per patient  . Headache   . History of abnormal cervical Pap smear   . History of exercise stress test dr Eden Emms   11-11-2013--  normal/  good exercise capacity, no chest pain, no ischemia  . History of pre-eclampsia 11/ 2014   hypertension and postpartum pulmonary edema--  resolved  . IBS (irritable bowel syndrome)   . Mild asthma   . PCOS (polycystic ovarian syndrome)     Past Surgical History:  Procedure Laterality Date  . CARPAL TUNNEL RELEASE Right 08/02/2015   Procedure: RIGHT HAND CARPAL TUNNEL RELEASE;  Surgeon: Bradly Bienenstock, MD;  Location[facility];  Service: Orthopedics;  Laterality: Right;  . CHOLECYSTECTOMY N/A 10/27/2014   Procedure: LAPAROSCOPIC CHOLECYSTECTOMY WITH INTRAOPERATIVE CHOLANGIOGRAM;  Surgeon: Atilano Ina, MD;  Location: [facility];  Service: General;  Laterality: N/A;  . dx laparoscopy    . I & D  LEFT VULVAR HEMATOMA, POSTPARTUM  07-05-2009  . ROBOTIC ASSISTED TOTAL HYSTERECTOMY WITH SALPINGECTOMY Bilateral 06/18/2017   Procedure: ROBOTIC ASSISTED TOTAL HYSTERECTOMY WITH SALPINGECTOMY;  Surgeon: Olivia Mackie, MD;  Location: [facility];  Service: Gynecology;  Laterality: Bilateral;  . TRANSTHORACIC ECHOCARDIOGRAM  10-28-2013   normal LVF,  ef 60-65%,  mild AR , MR and , TR    Social History   Tobacco Use  . Smoking status: Never Smoker  . Smokeless tobacco: Never Used  Substance Use Topics  . Alcohol use: Yes    Alcohol/week: 0.0 oz    Comment: occasional  . Drug use: No    Family History  Problem Relation Age of Onset  . Stroke Mother   . Hypertension Mother   . Heart disease Mother   . Deep vein thrombosis Mother        both legs, arms and back, 2 blood clots migrted to her chest area  . Diabetes Mother  Allergies  Allergen Reactions  . Bee Venom Anaphylaxis    Medication list has been reviewed and updated.  Current Outpatient Medications on File Prior to Visit  Medication Sig Dispense Refill  . albuterol (PROVENTIL HFA;VENTOLIN HFA) 108 (90 BASE) MCG/ACT inhaler Inhale 2 puffs into the lungs every 6 (six) hours as needed for wheezing. 1 Inhaler 0  . Biotin 8295610000 MCG TBDP Take 10,000 mcg by mouth daily.    . Calcium Carb-Cholecalciferol (CALCIUM 600 + D PO) Take 1 tablet by mouth daily.    . calcium carbonate (TUMS - DOSED IN MG ELEMENTAL CALCIUM) 500 MG chewable tablet Chew 1 tablet by mouth daily as needed for indigestion or heartburn.    . cefpodoxime (VANTIN) 200 MG tablet Take 1 tablet (200 mg total) by mouth 2 (two) times daily. 24 tablet 0  . cyclobenzaprine (FLEXERIL)  10 MG tablet Take 0.5-1 tablets (5-10 mg total) by mouth 3 (three) times daily as needed. 30 tablet 0  . EPINEPHrine (EPI-PEN) 0.3 mg/0.3 mL DEVI Inject 0.3 mLs (0.3 mg total) into the muscle once. 1 Device 0  . ferrous sulfate 325 (65 FE) MG EC tablet Take 325 mg by mouth at bedtime.    . Melatonin 10 MG TABS Take 10 mg by mouth at bedtime as needed (sleep).    . Potassium 99 MG TABS Take 99 mg by mouth daily.    . medroxyPROGESTERone (DEPO-PROVERA) 150 MG/ML injection Inject 150 mg into the muscle every 3 (three) months. Depo due 6/18-20/2018    . ondansetron (ZOFRAN-ODT) 8 MG disintegrating tablet Take 1 tablet (8 mg total) by mouth every 8 (eight) hours as needed for nausea. (Patient not taking: Reported on 02/08/2018) 30 tablet 0  . oxyCODONE-acetaminophen (ROXICET) 5-325 MG tablet Take 1-2 tablets by mouth every 4 (four) hours as needed for severe pain. (Patient not taking: Reported on 01/25/2018) 30 tablet 0  . traMADol (ULTRAM) 50 MG tablet Take 1-2 tablets (50-100 mg total) by mouth every 6 (six) hours as needed for moderate pain. (Patient not taking: Reported on 02/08/2018) 30 tablet 0   No current facility-administered medications on file prior to visit.     ROS ROS otherwise unremarkable unless listed above.  Physical Examination: BP 132/80   Pulse (!) 107   Temp 99.8 F (37.7 C) (Oral)   Resp 16   Ht 5\' 8"  (1.727 m)   Wt 240 lb (108.9 kg)   LMP 06/05/2017   SpO2 100%   BMI 36.49 kg/m  Ideal Body Weight: Weight in (lb) to have BMI = 25: 164.1  Physical Exam  Constitutional: She is oriented to person, place, and time. She appears well-developed and well-nourished. No distress.  HENT:  Head: Normocephalic and atraumatic.  Right Ear: External ear normal.  Left Ear: External ear normal.  Eyes: Conjunctivae and EOM are normal. Pupils are equal, round, and reactive to light.  Cardiovascular: Normal rate.  Pulmonary/Chest: Effort normal. No respiratory distress.   Musculoskeletal:  Flexion rom 60 degrees Pain with internal rotation and external  Weakened strength with lift off test Tender at ac joint   Neurological: She is alert and oriented to person, place, and time.  Skin: She is not diaphoretic.  Psychiatric: She has a normal mood and affect. Her behavior is normal.     Assessment and Plan: Erica Lang is a 42 y.o. female who is here today for cc of  Chief Complaint  Patient presents with  . Shoulder Pain  wc/ follow up  . Nausea    follow up   Nausea has resolved. Patient has shoulder improvement.  Continue rom stretches.  Pt has fmla forms, and this was completed today.  We will continue restrictions to 2/25. Strain of left shoulder, initial encounter  Trena Platt, PA-C [facility] 2/12/20199:57 AM

## 2018-02-09 ENCOUNTER — Encounter: Payer: Self-pay | Admitting: Physician Assistant

## 2018-02-09 DIAGNOSIS — N390 Urinary tract infection, site not specified: Secondary | ICD-10-CM | POA: Diagnosis not present

## 2018-02-09 DIAGNOSIS — R300 Dysuria: Secondary | ICD-10-CM | POA: Diagnosis not present

## 2018-02-09 DIAGNOSIS — B373 Candidiasis of vulva and vagina: Secondary | ICD-10-CM | POA: Diagnosis not present

## 2018-02-09 NOTE — Telephone Encounter (Signed)
Paperwork scanned and faxed on 02/09/18 as well as put up front at 104 for pt to come pick up

## 2018-02-16 DIAGNOSIS — Z0271 Encounter for disability determination: Secondary | ICD-10-CM

## 2018-02-24 ENCOUNTER — Ambulatory Visit (INDEPENDENT_AMBULATORY_CARE_PROVIDER_SITE_OTHER): Payer: BLUE CROSS/BLUE SHIELD | Admitting: Physician Assistant

## 2018-02-24 ENCOUNTER — Encounter: Payer: Self-pay | Admitting: Physician Assistant

## 2018-02-24 VITALS — BP 142/85 | HR 108 | Temp 98.6°F | Resp 18 | Ht 68.0 in | Wt 244.0 lb

## 2018-02-24 DIAGNOSIS — S46912D Strain of unspecified muscle, fascia and tendon at shoulder and upper arm level, left arm, subsequent encounter: Secondary | ICD-10-CM

## 2018-02-24 MED ORDER — CYCLOBENZAPRINE HCL 10 MG PO TABS: 5.0000 mg | ORAL_TABLET | Freq: Three times a day (TID) | ORAL | 0 refills | Status: DC | PRN

## 2018-02-24 NOTE — Patient Instructions (Signed)
     IF you received an x-ray today, you will receive an invoice from [facility]. Please contact [facility] at [phone] with questions or concerns regarding your invoice.   IF you received labwork today, you will receive an invoice from [facility]. Please contact [facility] at [phone] with questions or concerns regarding your invoice.   Our billing staff will not be able to assist you with questions regarding bills from these companies.  You will be contacted with the lab results as soon as they are available. The fastest way to get your results is to activate your My Chart account. Instructions are located on the last page of this paperwork. If you have not heard from us regarding the results in 2 weeks, please contact this office.     

## 2018-02-24 NOTE — Progress Notes (Signed)
[facility] [address], [place] [address] [phone]  Date:  02/24/2018   Name:  Erica Lang   DOB:  June 09, 1976   MRN:  [id number]  PCP:  Patient, No Pcp Per    History of Present Illness:  Erica Lang is a 42 y.o. female patient who presents to PCP with  Chief Complaint  Patient presents with  . Shoulder Pain    Left, follow up     Left shoulder pain follow up.  She states that she has had pain with lifting her arm, however notes great improvement with this.  No paresthesia or swelling.  She is icing, reporting that she is stretching as instructed, and taking the flexeril as needed.   Her job entails heavy lifting. She continues to not be working.  Despite the specific restrictions of just not using the left arm, she reports that her job placed her out of work.    Patient Active Problem List   Diagnosis Date Noted  . Pyelonephritis 05/29/2016  . Sepsis (HCC) 05/29/2016  . Acute UTI 05/29/2016  . Headache   . Chest pain 11/01/2013  . Anemia   . Anxiety   . Hypertension   . Hidradenitis   . Obesity   . PCOS (polycystic ovarian syndrome)   . Asthma   . 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 Hx of varicella   . Persistent headaches 10/30/2013  . Left sided numbness 10/29/2013  . Postpartum hypertension 10/19/2013  . Postpartum edema 10/19/2013  . Dysmenorrhea 10/28/2007  . DYSFUNCTIONAL UTERINE BLEEDING 10/28/2007  . POLYCYSTIC OVARIAN DISEASE 05/03/1999    Past Medical History:  Diagnosis Date  . Anemia   . Anxiety   . Bilateral carpal tunnel syndrome    left only per patient  . Headache   . History of abnormal cervical Pap smear   . History of exercise stress test dr Eden Emmsnishan   11-11-2013--  normal/  good exercise capacity, no chest pain, no ischemia  . History of pre-eclampsia 11/ 2014   hypertension and postpartum pulmonary edema--  resolved  . IBS (irritable bowel syndrome)   . Mild asthma   . PCOS (polycystic ovarian  syndrome)     Past Surgical History:  Procedure Laterality Date  . CARPAL TUNNEL RELEASE Right 08/02/2015   Procedure: RIGHT HAND CARPAL TUNNEL RELEASE;  Surgeon: Bradly BienenstockFred Ortmann, MD;  [facility] [place] [facility];  Service: Orthopedics;  Laterality: Right;  . CHOLECYSTECTOMY N/A 10/27/2014   Procedure: LAPAROSCOPIC CHOLECYSTECTOMY WITH INTRAOPERATIVE CHOLANGIOGRAM;  Surgeon: Atilano InaEric M Wilson, MD;  Location: [facility];  Service: General;  Laterality: N/A;  . dx laparoscopy    . I & D  LEFT VULVAR HEMATOMA, POSTPARTUM  07-05-2009  . ROBOTIC ASSISTED TOTAL HYSTERECTOMY WITH SALPINGECTOMY Bilateral 06/18/2017   Procedure: ROBOTIC ASSISTED TOTAL HYSTERECTOMY WITH SALPINGECTOMY;  Surgeon: Olivia Mackieaavon, Richard, MD;  Location: [facility];  Service: Gynecology;  Laterality: Bilateral;  . TRANSTHORACIC ECHOCARDIOGRAM  10-28-2013   normal LVF,  ef 60-65%,  mild AR , MR and , TR    Social History   Tobacco Use  . Smoking status: Never Smoker  . Smokeless tobacco: Never Used  Substance Use Topics  . Alcohol use: Yes    Alcohol/week: 0.0 oz    Comment: occasional  . Drug use: No    Family History  Problem Relation Age of Onset  . Stroke Mother   . Hypertension Mother   . Heart disease Mother   .  Deep vein thrombosis Mother        both legs, arms and back, 2 blood clots migrted to her chest area  . Diabetes Mother     Allergies  Allergen Reactions  . Bee Venom Anaphylaxis    Medication list has been reviewed and updated.  Current Outpatient Medications on File Prior to Visit  Medication Sig Dispense Refill  . albuterol (PROVENTIL HFA;VENTOLIN HFA) 108 (90 BASE) MCG/ACT inhaler Inhale 2 puffs into the lungs every 6 (six) hours as needed for wheezing. 1 Inhaler 0  . Biotin [address] MCG TBDP Take 10,000 mcg by mouth daily.    . Calcium Carb-Cholecalciferol (CALCIUM 600 + D PO) Take 1 tablet by mouth daily.    . cyclobenzaprine (FLEXERIL) 10 MG tablet Take 0.5-1 tablets (5-10 mg total) by mouth 3  (three) times daily as needed. 30 tablet 0  . EPINEPHrine (EPI-PEN) 0.3 mg/0.3 mL DEVI Inject 0.3 mLs (0.3 mg total) into the muscle once. 1 Device 0  . ferrous sulfate 325 (65 FE) MG EC tablet Take 325 mg by mouth at bedtime.    . Melatonin 10 MG TABS Take 10 mg by mouth at bedtime as needed (sleep).    . Potassium 99 MG TABS Take 99 mg by mouth daily.    . calcium carbonate (TUMS - DOSED IN MG ELEMENTAL CALCIUM) 500 MG chewable tablet Chew 1 tablet by mouth daily as needed for indigestion or heartburn.    Marland Kitchen oxyCODONE-acetaminophen (ROXICET) 5-325 MG tablet Take 1-2 tablets by mouth every 4 (four) hours as needed for severe pain. (Patient not taking: Reported on 01/25/2018) 30 tablet 0   No current facility-administered medications on file prior to visit.     ROS ROS otherwise unremarkable unless listed above.  Physical Examination: BP (!) 142/85   Pulse (!) 108   Temp 98.6 F (37 C) (Oral)   Resp 18   Ht 5\' 8"  (1.727 m)   Wt 244 lb (110.7 kg)   LMP 06/05/2017   SpO2 99%   BMI 37.10 kg/m  Ideal Body Weight: Weight in (lb) to have BMI = 25: 164.1  Physical Exam  Constitutional: She is oriented to person, place, and time. She appears well-developed and well-nourished. No distress.  HENT:  Head: Normocephalic and atraumatic.  Right Ear: External ear normal.  Left Ear: External ear normal.  Eyes: Conjunctivae and EOM are normal. Pupils are equal, round, and reactive to light.  Cardiovascular: Normal rate.  Pulmonary/Chest: Effort normal. No respiratory distress.  Musculoskeletal:       Right shoulder: She exhibits decreased range of motion (abduction--though improved.  internal rotation painful though able to do.  ), bony tenderness (superior area of the deltoid), pain and decreased strength. She exhibits no crepitus and no spasm.  Neurological: She is alert and oriented to person, place, and time.  Skin: She is not diaphoretic.  Psychiatric: She has a normal mood and affect. Her  behavior is normal.     Assessment and Plan: [name] is a 42 y.o. female who is here today for cc of  Chief Complaint  Patient presents with  . Shoulder Pain    Left, follow up   --shoulder pain has improved greatly since last visit.  We will continue the regimen.  Restrictions still implemented.  Rtc in 2 weeks. She would like to hold off on an injection at this time. Strain of left shoulder, initial encounter  Trena Platt, PA-C [facility] 2/27/20191:01 PM

## 2018-03-10 ENCOUNTER — Ambulatory Visit (INDEPENDENT_AMBULATORY_CARE_PROVIDER_SITE_OTHER): Payer: BLUE CROSS/BLUE SHIELD | Admitting: Physician Assistant

## 2018-03-10 ENCOUNTER — Encounter: Payer: Self-pay | Admitting: Physician Assistant

## 2018-03-10 VITALS — BP 130/89 | HR 119 | Temp 99.0°F | Resp 18 | Ht 68.0 in | Wt 246.0 lb

## 2018-03-10 DIAGNOSIS — S46912D Strain of unspecified muscle, fascia and tendon at shoulder and upper arm level, left arm, subsequent encounter: Secondary | ICD-10-CM | POA: Diagnosis not present

## 2018-03-10 NOTE — Progress Notes (Signed)
[facility] [address], [place] [address] [phone]  Date:  03/10/2018   Name:  Erica Lang   DOB:  Jul 23, 1976   MRN:  [id number]  PCP:  Patient, No Pcp Per    History of Present Illness:  Erica Lang is a 42 y.o. female patient who presents to PCP with  Chief Complaint  Patient presents with  . Shoulder Pain    follow up states she has no issues     Patient continues to have shoulder improvement. Her ROM is back, though there is pain incited with this.  She continues to do stretches, and has added weights into her regimen.  She denies any swelling or paresthesia.   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  Mopping, cleaning her station and kitchen is also part of her daily chores.  She generally has no assistance with this.  Including significant lifting.   Patient Active Problem List   Diagnosis Date Noted  . Pyelonephritis 05/29/2016  . Sepsis (HCC) 05/29/2016  . Acute UTI 05/29/2016  . Headache   . Chest pain 11/01/2013  . Anemia   . Anxiety   . Hypertension   . Hidradenitis   . Obesity   . PCOS (polycystic ovarian syndrome)   . Asthma   . 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 Hx of varicella   . Persistent headaches 10/30/2013  . Left sided numbness 10/29/2013  . Postpartum hypertension 10/19/2013  . Postpartum edema 10/19/2013  . Dysmenorrhea 10/28/2007  . DYSFUNCTIONAL UTERINE BLEEDING 10/28/2007  . POLYCYSTIC OVARIAN DISEASE 05/03/1999    Past Medical History:  Diagnosis Date  . Anemia   . Anxiety   . Bilateral carpal tunnel syndrome    left only per patient  . Headache   . History of abnormal cervical Pap smear   . History of exercise stress test dr Eden Emmsnishan   11-11-2013--  normal/  good exercise capacity, no chest pain, no ischemia  . History of pre-eclampsia 11/ 2014   hypertension and postpartum pulmonary edema--   resolved  . IBS (irritable bowel syndrome)   . Mild asthma   . PCOS (polycystic ovarian syndrome)     Past Surgical History:  Procedure Laterality Date  . CARPAL TUNNEL RELEASE Right 08/02/2015   Procedure: RIGHT HAND CARPAL TUNNEL RELEASE;  Surgeon: Bradly BienenstockFred Ortmann, MD;  [facility] [place];  Service: Orthopedics;  Laterality: Right;  . CHOLECYSTECTOMY N/A 10/27/2014   Procedure: LAPAROSCOPIC CHOLECYSTECTOMY WITH INTRAOPERATIVE CHOLANGIOGRAM;  Surgeon: Atilano InaEric M Wilson, MD;  Location: [facility];  Service: General;  Laterality: N/A;  . dx laparoscopy    . I & Lang  LEFT VULVAR HEMATOMA, POSTPARTUM  07-05-2009  . ROBOTIC ASSISTED TOTAL HYSTERECTOMY WITH SALPINGECTOMY Bilateral 06/18/2017   Procedure: ROBOTIC ASSISTED TOTAL HYSTERECTOMY WITH SALPINGECTOMY;  Surgeon: Olivia Mackieaavon, Richard, MD;  Location: [facility];  Service: Gynecology;  Laterality: Bilateral;  . TRANSTHORACIC ECHOCARDIOGRAM  10-28-2013   normal LVF,  ef 60-65%,  mild AR , MR and , TR    Social History   Tobacco Use  . Smoking status: Never Smoker  . Smokeless tobacco: Never Used  Substance Use Topics  . Alcohol use: Yes    Alcohol/week: 0.0 oz    Comment: occasional  . Drug use: No    Family History  Problem Relation Age of Onset  .  Stroke Mother   . Hypertension Mother   . Heart disease Mother   . Deep vein thrombosis Mother        both legs, arms and back, 2 blood clots migrted to her chest area  . Diabetes Mother     Allergies  Allergen Reactions  . Bee Venom Anaphylaxis    Medication list has been reviewed and updated.  Current Outpatient Medications on File Prior to Visit  Medication Sig Dispense Refill  . albuterol (PROVENTIL HFA;VENTOLIN HFA) 108 (90 BASE) MCG/ACT inhaler Inhale 2 puffs into the lungs every 6 (six) hours as needed for wheezing. 1 Inhaler 0  . Biotin 40981 MCG TBDP Take 10,000 mcg by mouth daily.    . Calcium Carb-Cholecalciferol (CALCIUM 600 + Lang PO) Take 1 tablet by mouth daily.     . calcium carbonate (TUMS - DOSED IN MG ELEMENTAL CALCIUM) 500 MG chewable tablet Chew 1 tablet by mouth daily as needed for indigestion or heartburn.    . cyclobenzaprine (FLEXERIL) 10 MG tablet Take 0.5-1 tablets (5-10 mg total) by mouth 3 (three) times daily as needed. 30 tablet 0  . EPINEPHrine (EPI-PEN) 0.3 mg/0.3 mL DEVI Inject 0.3 mLs (0.3 mg total) into the muscle once. 1 Device 0  . ferrous sulfate 325 (65 FE) MG EC tablet Take 325 mg by mouth at bedtime.    . Melatonin 10 MG TABS Take 10 mg by mouth at bedtime as needed (sleep).    . Potassium 99 MG TABS Take 99 mg by mouth daily.     No current facility-administered medications on file prior to visit.     ROS ROS otherwise unremarkable unless listed above.  Physical Examination: BP (!) 157/102   Pulse (!) 129   Temp 99 F (37.2 C) (Oral)   Resp 18   Ht 5\' 8"  (1.727 m)   Wt 246 lb (111.6 kg)   LMP 06/05/2017   SpO2 100%   BMI 37.40 kg/m  Ideal Body Weight: Weight in (lb) to have BMI = 25: 164.1  Physical Exam  Constitutional: She is oriented to person, place, and time. She appears well-developed and well-nourished. No distress.  HENT:  Head: Normocephalic and atraumatic.  Right Ear: External ear normal.  Left Ear: External ear normal.  Eyes: Conjunctivae and EOM are normal. Pupils are equal, round, and reactive to light.  Cardiovascular: Normal rate.  Pulmonary/Chest: Effort normal. No respiratory distress.  Musculoskeletal:       Left shoulder: She exhibits decreased strength (strength 4/5 and barely apparent, with resisted adduction, and flexion.  ). She exhibits normal range of motion, no bony tenderness, no deformity and no spasm.  Neurological: She is alert and oriented to person, place, and time.  Skin: She is not diaphoretic.  Psychiatric: She has a normal mood and affect. Her behavior is normal.     Assessment and Plan: Erica Lang is a 42 y.o. female who is here today for cc of  Chief Complaint   Patient presents with  . Shoulder Pain    follow up states she has no issues   --patient continues to have improvement.  We are going to incorporate home stretching and lifting, followed by icing regimen.  She will follow up in 1 week.  Likely restrictions will be summating at her next follow up.   Strain of left shoulder, subsequent encounter  Trena Platt, PA-C [facility] 3/14/20194:00 PM

## 2018-03-10 NOTE — Patient Instructions (Signed)
     IF you received an x-ray today, you will receive an invoice from [facility]. Please contact [facility] at [phone] with questions or concerns regarding your invoice.   IF you received labwork today, you will receive an invoice from [facility]. Please contact [facility] at [phone] with questions or concerns regarding your invoice.   Our billing staff will not be able to assist you with questions regarding bills from these companies.  You will be contacted with the lab results as soon as they are available. The fastest way to get your results is to activate your My Chart account. Instructions are located on the last page of this paperwork. If you have not heard from us regarding the results in 2 weeks, please contact this office.     

## 2018-03-17 ENCOUNTER — Encounter: Payer: Self-pay | Admitting: Physician Assistant

## 2018-03-17 ENCOUNTER — Ambulatory Visit (INDEPENDENT_AMBULATORY_CARE_PROVIDER_SITE_OTHER): Payer: BLUE CROSS/BLUE SHIELD | Admitting: Physician Assistant

## 2018-03-17 VITALS — BP 131/87 | HR 110 | Temp 99.3°F | Resp 16 | Ht 69.0 in | Wt 249.2 lb

## 2018-03-17 DIAGNOSIS — S46912D Strain of unspecified muscle, fascia and tendon at shoulder and upper arm level, left arm, subsequent encounter: Secondary | ICD-10-CM | POA: Diagnosis not present

## 2018-03-17 NOTE — Progress Notes (Signed)
PRIMARY CARE AT [facility] [address] [place] [address] [phone]  Date:  03/17/2018   Name:  Erica Lang   DOB:  28-Jan-1976   MRN:  [id number]  PCP:  Patient, No Pcp Per    History of Present Illness:  Erica Lang is a 42 y.o. female patient who presents to PCP with  Chief Complaint  Patient presents with  . shoulder f/u     Patient is here today for follow-up of her left shoulder strain.  This occurred at work when she was moving an item in the kitchen over to another area.  She has had pain with range of motion of her left shoulder.  She was placed on restrictions about 8 weeks ago.  Her job had put her out of work for this time.  She has been performing stretches daily and doing icing regimen she is also taking the muscle relaxant and anti-inflammatory.  She is doing much better she states.  She still has some pain with raising her arm up with resistance as well as with carrying over her arm across her center.  She denies any swelling.  There is concern of returning to work at full pace and having the shoulder pain resurfaced in intensity.  Patient Active Problem List   Diagnosis Date Noted  . Pyelonephritis 05/29/2016  . Sepsis (HCC) 05/29/2016  . Acute UTI 05/29/2016  . Headache   . Chest pain 11/01/2013  . Anemia   . Anxiety   . Hypertension   . Hidradenitis   . Obesity   . PCOS (polycystic ovarian syndrome)   . Asthma   . 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 Hx of varicella   . Persistent headaches 10/30/2013  . Left sided numbness 10/29/2013  . Postpartum hypertension 10/19/2013  . Postpartum edema 10/19/2013  . Dysmenorrhea 10/28/2007  . DYSFUNCTIONAL UTERINE BLEEDING 10/28/2007  . POLYCYSTIC OVARIAN DISEASE 05/03/1999    Past Medical History:  Diagnosis Date  . Anemia   . Anxiety   . Bilateral carpal tunnel syndrome    left only per patient  . Headache   . History of abnormal cervical Pap smear   . History of exercise stress test dr  Eden Emms   11-11-2013--  normal/  good exercise capacity, no chest pain, no ischemia  . History of pre-eclampsia 11/ 2014   hypertension and postpartum pulmonary edema--  resolved  . IBS (irritable bowel syndrome)   . Mild asthma   . PCOS (polycystic ovarian syndrome)     Past Surgical History:  Procedure Laterality Date  . CARPAL TUNNEL RELEASE Right 08/02/2015   Procedure: RIGHT HAND CARPAL TUNNEL RELEASE;  Surgeon: Bradly Bienenstock, MD;  Location: [facility];  Service: Orthopedics;  Laterality: Right;  . CHOLECYSTECTOMY N/A 10/27/2014   Procedure: LAPAROSCOPIC CHOLECYSTECTOMY WITH INTRAOPERATIVE CHOLANGIOGRAM;  Surgeon: Atilano Ina, MD;  Location: [facility];  Service: General;  Laterality: N/A;  . dx laparoscopy    . I & D  LEFT VULVAR HEMATOMA, POSTPARTUM  07-05-2009  . ROBOTIC ASSISTED TOTAL HYSTERECTOMY WITH SALPINGECTOMY Bilateral 06/18/2017   Procedure: ROBOTIC ASSISTED TOTAL HYSTERECTOMY WITH SALPINGECTOMY;  Surgeon: Olivia Mackie, MD;  Location: [facility];  Service: Gynecology;  Laterality: Bilateral;  . TRANSTHORACIC ECHOCARDIOGRAM  10-28-2013   normal LVF,  ef 60-65%,  mild AR , MR and , TR    Social History   Tobacco Use  . Smoking status: Never Smoker  . Smokeless tobacco: Never Used  Substance Use Topics  . Alcohol use: Yes    Alcohol/week: 0.0 oz    Comment: occasional  . Drug use: No    Family History  Problem Relation Age of Onset  . Stroke Mother   . Hypertension Mother   . Heart disease Mother   . Deep vein thrombosis Mother        both legs, arms and back, 2 blood clots migrted to her chest area  . Diabetes Mother     Allergies  Allergen Reactions  . Bee Venom Anaphylaxis    Medication list has been reviewed and updated.  Current Outpatient Medications on File Prior to Visit  Medication Sig Dispense Refill  . albuterol (PROVENTIL HFA;VENTOLIN HFA) 108 (90 BASE) MCG/ACT inhaler Inhale 2 puffs into the lungs every 6 (six) hours as needed  for wheezing. 1 Inhaler 0  . Biotin [address] MCG TBDP Take 10,000 mcg by mouth daily.    . Calcium Carb-Cholecalciferol (CALCIUM 600 + D PO) Take 1 tablet by mouth daily.    . calcium carbonate (TUMS - DOSED IN MG ELEMENTAL CALCIUM) 500 MG chewable tablet Chew 1 tablet by mouth daily as needed for indigestion or heartburn.    . cyclobenzaprine (FLEXERIL) 10 MG tablet Take 0.5-1 tablets (5-10 mg total) by mouth 3 (three) times daily as needed. 30 tablet 0  . EPINEPHrine (EPI-PEN) 0.3 mg/0.3 mL DEVI Inject 0.3 mLs (0.3 mg total) into the muscle once. 1 Device 0  . ferrous sulfate 325 (65 FE) MG EC tablet Take 325 mg by mouth at bedtime.    . Melatonin 10 MG TABS Take 10 mg by mouth at bedtime as needed (sleep).    . Potassium 99 MG TABS Take 99 mg by mouth daily.     No current facility-administered medications on file prior to visit.     ROS ROS otherwise unremarkable unless listed above.  Physical Examination: BP 131/87 (BP Location: Right Arm, Patient Position: Sitting, Cuff Size: Large)   Pulse (!) 110   Temp 99.3 F (37.4 C) (Oral)   Resp 16   Ht 5\' 9"  (1.753 m)   Wt 249 lb 3.2 oz (113 kg)   LMP 06/05/2017   SpO2 100%   BMI 36.80 kg/m  Ideal Body Weight: Weight in (lb) to have BMI = 25: 168.9  Physical Exam  Constitutional: She is oriented to person, place, and time. She appears well-developed and well-nourished. No distress.  HENT:  Head: Normocephalic and atraumatic.  Right Ear: External ear normal.  Left Ear: External ear normal.  Eyes: Pupils are equal, round, and reactive to light. Conjunctivae and EOM are normal.  Cardiovascular: Normal rate.  Pulmonary/Chest: Effort normal. No respiratory distress.  Musculoskeletal:       Left shoulder: She exhibits pain (pain with abduction and with bring the arm above the head, however able to do this with less pain, and without drop.  ). She exhibits normal range of motion and no spasm.  Neurological: She is alert and oriented to  person, place, and time.  Skin: She is not diaphoretic.  Psychiatric: She has a normal mood and affect. Her behavior is normal.     Assessment and Plan: Erica Lang is a 42 y.o. female who is here today for  Chief Complaint  Patient presents with  . shoulder f/u  shoulder has improved greatly.  She states that she is ready to start work again, shortly.  I have advised that she perform rom exercise and  icing.  She will return in 2 weeks.  Restrictions advised to start in 6 days.  Letter is in chart.   Strain of left shoulder, subsequent encounter  Trena PlattStephanie English, PA-C [facility] 3/29/20199:30 AM

## 2018-03-17 NOTE — Patient Instructions (Addendum)
   Continue regimen. You will take ibuprofen 600mg  every 8 hours, as needed.  You may take the 600mg  every 8 hours when you return to work.     IF you received an x-ray today, you will receive an invoice from [facility]. Please contact [facility] at [phone] with questions or concerns regarding your invoice.   IF you received labwork today, you will receive an invoice from [facility]. Please contact [facility] at [phone] with questions or concerns regarding your invoice.   Our billing staff will not be able to assist you with questions regarding bills from these companies.  You will be contacted with the lab results as soon as they are available. The fastest way to get your results is to activate your My Chart account. Instructions are located on the last page of this paperwork. If you have not heard from us regarding the results in 2 weeks, please contact this office.

## 2018-03-18 ENCOUNTER — Other Ambulatory Visit: Payer: Self-pay | Admitting: Physician Assistant

## 2018-03-18 NOTE — Telephone Encounter (Signed)
Refill  Request  Flexeril 10 mg    Last filled 02/24/2018    5-10  Mg   Tid  As  Needed   Quantity  30 LOV   Yesterday   03/17/2018   Pharmacy  [facility]   [address]

## 2018-03-18 NOTE — Telephone Encounter (Signed)
Copied from CRM [id number]. Topic: Quick Communication - Rx Refill/Question >> Mar 18, 2018  2:58 PM Arlyss Gandyichardson, Brenton Joines N, NT wrote: Medication: cyclobenzaprine Lottie Dawson(FLEXERIL) Has the patient contacted their pharmacy? Yes.   (Agent: If no, request that the patient contact the pharmacy for the refill.) Preferred Pharmacy (with phone number or street name): [facility] [address] Agent: Please be advised that RX refills may take up to 3 business days. We ask that you follow-up with your pharmacy.

## 2018-03-19 MED ORDER — CYCLOBENZAPRINE HCL 10 MG PO TABS: 5.0000 mg | ORAL_TABLET | Freq: Three times a day (TID) | ORAL | 0 refills | Status: DC | PRN

## 2018-03-19 NOTE — Telephone Encounter (Signed)
Will route to office for approval 

## 2018-03-30 ENCOUNTER — Encounter: Payer: Self-pay | Admitting: Physician Assistant

## 2018-03-31 ENCOUNTER — Other Ambulatory Visit: Payer: Self-pay

## 2018-03-31 ENCOUNTER — Encounter: Payer: Self-pay | Admitting: Physician Assistant

## 2018-03-31 ENCOUNTER — Ambulatory Visit (INDEPENDENT_AMBULATORY_CARE_PROVIDER_SITE_OTHER): Payer: BLUE CROSS/BLUE SHIELD | Admitting: Physician Assistant

## 2018-03-31 VITALS — BP 130/72 | HR 118 | Temp 100.0°F | Resp 18 | Ht 69.0 in | Wt 248.0 lb

## 2018-03-31 DIAGNOSIS — K529 Noninfective gastroenteritis and colitis, unspecified: Secondary | ICD-10-CM

## 2018-03-31 DIAGNOSIS — S46912D Strain of unspecified muscle, fascia and tendon at shoulder and upper arm level, left arm, subsequent encounter: Secondary | ICD-10-CM

## 2018-03-31 MED ORDER — ONDANSETRON 8 MG PO TBDP: 8.0000 mg | ORAL_TABLET | Freq: Three times a day (TID) | ORAL | 0 refills | Status: DC | PRN

## 2018-03-31 NOTE — Patient Instructions (Addendum)
 Food Choices to Help Relieve Diarrhea, Adult When you have diarrhea, the foods you eat and your eating habits are very important. Choosing the right foods and drinks can help:  Relieve diarrhea.  Replace lost fluids and nutrients.  Prevent dehydration.  What general guidelines should I follow? Relieving diarrhea  Choose foods with less than 2 g or .07 oz. of fiber per serving.  Limit fats to less than 8 tsp (38 g or 1.34 oz.) a day.  Avoid the following: ? Foods and beverages sweetened with high-fructose corn syrup, honey, or sugar alcohols such as xylitol, sorbitol, and mannitol. ? Foods that contain a lot of fat or sugar. ? Fried, greasy, or spicy foods. ? High-fiber grains, breads, and cereals. ? Raw fruits and vegetables.  Eat foods that are rich in probiotics. These foods include dairy products such as yogurt and fermented milk products. They help increase healthy bacteria in the stomach and intestines (gastrointestinal tract, or GI tract).  If you have lactose intolerance, avoid dairy products. These may make your diarrhea worse.  Take medicine to help stop diarrhea (antidiarrheal medicine) only as told by your health care provider. Replacing nutrients  Eat small meals or snacks every 3-4 hours.  Eat bland foods, such as white rice, toast, or baked potato, until your diarrhea starts to get better. Gradually reintroduce nutrient-rich foods as tolerated or as told by your health care provider. This includes: ? Well-cooked protein foods. ? Peeled, seeded, and soft-cooked fruits and vegetables. ? Low-fat dairy products.  Take vitamin and mineral supplements as told by your health care provider. Preventing dehydration   Start by sipping water or a special solution to prevent dehydration (oral rehydration solution, ORS). Urine that is clear or pale yellow means that you are getting enough fluid.  Try to drink at least 8-10 cups of fluid each day to help replace lost  fluids.  You may add other liquids in addition to water, such as clear juice or decaffeinated sports drinks, as tolerated or as told by your health care provider.  Avoid drinks with caffeine, such as coffee, tea, or soft drinks.  Avoid alcohol. What foods are recommended? The items listed may not be a complete list. Talk with your health care provider about what dietary choices are best for you. Grains White rice. White, French, or pita breads (fresh or toasted), including plain rolls, buns, or bagels. White pasta. Saltine, soda, or graham crackers. Pretzels. Low-fiber cereal. Cooked cereals made with water (such as cornmeal, farina, or cream cereals). Plain muffins. Matzo. Melba toast. Zwieback. Vegetables Potatoes (without the skin). Most well-cooked and canned vegetables without skins or seeds. Tender lettuce. Fruits Apple sauce. Fruits canned in juice. Cooked apricots, cherries, grapefruit, peaches, pears, or plums. Fresh bananas and cantaloupe. Meats and other protein foods Baked or boiled chicken. Eggs. Tofu. Fish. Seafood. Smooth nut butters. Ground or well-cooked tender beef, ham, veal, lamb, pork, or poultry. Dairy Plain yogurt, kefir, and unsweetened liquid yogurt. Lactose-free milk, buttermilk, skim milk, or soy milk. Low-fat or nonfat hard cheese. Beverages Water. Low-calorie sports drinks. Fruit juices without pulp. Strained tomato and vegetable juices. Decaffeinated teas. Sugar-free beverages not sweetened with sugar alcohols. Oral rehydration solutions, if approved by your health care provider. Seasoning and other foods Bouillon, broth, or soups made from recommended foods. What foods are not recommended? The items listed may not be a complete list. Talk with your health care provider about what dietary choices are best for you. Grains Whole grain, whole   wheat, bran, or rye breads, rolls, pastas, and crackers. Wild or brown rice. Whole grain or bran cereals. Barley. Oats and  oatmeal. Corn tortillas or taco shells. Granola. Popcorn. Vegetables Raw vegetables. Fried vegetables. Cabbage, broccoli, Brussels sprouts, artichokes, baked beans, beet greens, corn, kale, legumes, peas, sweet potatoes, and yams. Potato skins. Cooked spinach and cabbage. Fruits Dried fruit, including raisins and dates. Raw fruits. Stewed or dried prunes. Canned fruits with syrup. Meat and other protein foods Fried or fatty meats. Deli meats. Chunky nut butters. Nuts and seeds. Beans and lentils. Bacon. Hot dogs. Sausage. Dairy High-fat cheeses. Whole milk, chocolate milk, and beverages made with milk, such as milk shakes. Half-and-half. Cream. sour cream. Ice cream. Beverages Caffeinated beverages (such as coffee, tea, soda, or energy drinks). Alcoholic beverages. Fruit juices with pulp. Prune juice. Soft drinks sweetened with high-fructose corn syrup or sugar alcohols. High-calorie sports drinks. Fats and oils Butter. Cream sauces. Margarine. Salad oils. Plain salad dressings. Olives. Avocados. Mayonnaise. Sweets and desserts Sweet rolls, doughnuts, and sweet breads. Sugar-free desserts sweetened with sugar alcohols such as xylitol and sorbitol. Seasoning and other foods Honey. Hot sauce. Chili powder. Gravy. Cream-based or milk-based soups. Pancakes and waffles. Summary  When you have diarrhea, the foods you eat and your eating habits are very important.  Make sure you get at least 8-10 cups of fluid each day, or enough to keep your urine clear or pale yellow.  Eat bland foods and gradually reintroduce healthy, nutrient-rich foods as tolerated, or as told by your health care provider.  Avoid high-fiber, fried, greasy, or spicy foods. This information is not intended to replace advice given to you by your health care provider. Make sure you discuss any questions you have with your health care provider. Document Released: 03/06/2004 Document Revised: 12/12/2016 Document Reviewed:  12/12/2016 Elsevier Interactive Patient Education  2018 Elsevier Inc.    IF you received an x-ray today, you will receive an invoice from [facility]. Please contact [facility] at [phone] with questions or concerns regarding your invoice.   IF you received labwork today, you will receive an invoice from [facility]. Please contact [facility] at [phone] with questions or concerns regarding your invoice.   Our billing staff will not be able to assist you with questions regarding bills from these companies.  You will be contacted with the lab results as soon as they are available. The fastest way to get your results is to activate your My Chart account. Instructions are located on the last page of this paperwork. If you have not heard from us regarding the results in 2 weeks, please contact this office.     

## 2018-03-31 NOTE — Progress Notes (Signed)
PRIMARY CARE AT [facility] [address] [place] [address] [phone]  Date:  03/31/2018   Name:  Erica Lang   DOB:  [birth year]   MRN:  [id number]  PCP:  Patient, No Pcp Per    History of Present Illness:  Erica Lang is a 42 y.o. female patient who presents to PCP with  Chief Complaint  Patient presents with  . Shoulder Pain    follow up on left shoulder      Patient is here for left-sided shoulder pain.  This is week 7.  Patient has continued to work on shoulder stretching.  She is icing the shoulder.  Take the anti-inflammatory.  Patient was placed on light or restrictions.  These in place (see letter), her employer continues to keep her out of work despite her ability to perform much of her work with these restrictions.  Patient will like to return to work at this time.  She would like to follow-up following the start of her return.  She denies any numbness or tingling.  Shoulder pain incited with cross activity of the shoulder.  There is also pain with overhead lifting. She is also here for abdominal pain and nausea.  She denies any fever.  Abdominal pain is generalized.  Patient Active Problem List   Diagnosis Date Noted  . Pyelonephritis 05/29/2016  . Sepsis (HCC) 05/29/2016  . Acute UTI 05/29/2016  . Headache   . Chest pain 11/01/2013  . Anemia   . Anxiety   . Hypertension   . Hidradenitis   . Obesity   . PCOS (polycystic ovarian syndrome)   . Asthma   . 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 Hx of varicella   . Persistent headaches 10/30/2013  . Left sided numbness 10/29/2013  . Postpartum hypertension 10/19/2013  . Postpartum edema 10/19/2013  . Dysmenorrhea 10/28/2007  . DYSFUNCTIONAL UTERINE BLEEDING 10/28/2007  . POLYCYSTIC OVARIAN DISEASE 05/03/1999    Past Medical History:  Diagnosis Date  . Anemia   . Anxiety   . Bilateral carpal tunnel syndrome    left only per patient  . Headache   . History of abnormal cervical Pap smear   .  History of exercise stress test dr Eden Emms   11-11-2013--  normal/  good exercise capacity, no chest pain, no ischemia  . History of pre-eclampsia 11/ 2014   hypertension and postpartum pulmonary edema--  resolved  . IBS (irritable bowel syndrome)   . Mild asthma   . PCOS (polycystic ovarian syndrome)     Past Surgical History:  Procedure Laterality Date  . CARPAL TUNNEL RELEASE Right 08/02/2015   Procedure: RIGHT HAND CARPAL TUNNEL RELEASE;  Surgeon: Bradly Bienenstock, MD;  Location: [facility];  Service: Orthopedics;  Laterality: Right;  . CHOLECYSTECTOMY N/A 10/27/2014   Procedure: LAPAROSCOPIC CHOLECYSTECTOMY WITH INTRAOPERATIVE CHOLANGIOGRAM;  Surgeon: Atilano Ina, MD;  Location: [facility];  Service: General;  Laterality: N/A;  . dx laparoscopy    . I & D  LEFT VULVAR HEMATOMA, POSTPARTUM  07-05-2009  . ROBOTIC ASSISTED TOTAL HYSTERECTOMY WITH SALPINGECTOMY Bilateral 06/18/2017   Procedure: ROBOTIC ASSISTED TOTAL HYSTERECTOMY WITH SALPINGECTOMY;  Surgeon: Olivia Mackie, MD;  Location: [facility];  Service: Gynecology;  Laterality: Bilateral;  . TRANSTHORACIC ECHOCARDIOGRAM  10-28-2013   normal LVF,  ef 60-65%,  mild AR , MR and , TR    Social History   Tobacco Use  . Smoking status: Never Smoker  . Smokeless tobacco: Never  Used  Substance Use Topics  . Alcohol use: Yes    Alcohol/week: 0.0 oz    Comment: occasional  . Drug use: No    Family History  Problem Relation Age of Onset  . Stroke Mother   . Hypertension Mother   . Heart disease Mother   . Deep vein thrombosis Mother        both legs, arms and back, 2 blood clots migrted to her chest area  . Diabetes Mother     Allergies  Allergen Reactions  . Bee Venom Anaphylaxis    Medication list has been reviewed and updated.  Current Outpatient Medications on File Prior to Visit  Medication Sig Dispense Refill  . albuterol (PROVENTIL HFA;VENTOLIN HFA) 108 (90 BASE) MCG/ACT inhaler Inhale 2 puffs into the  lungs every 6 (six) hours as needed for wheezing. 1 Inhaler 0  . Biotin 1610910000 MCG TBDP Take 10,000 mcg by mouth daily.    . Calcium Carb-Cholecalciferol (CALCIUM 600 + D PO) Take 1 tablet by mouth daily.    . calcium carbonate (TUMS - DOSED IN MG ELEMENTAL CALCIUM) 500 MG chewable tablet Chew 1 tablet by mouth daily as needed for indigestion or heartburn.    . cyclobenzaprine (FLEXERIL) 10 MG tablet Take 0.5-1 tablets (5-10 mg total) by mouth 3 (three) times daily as needed. 30 tablet 0  . EPINEPHrine (EPI-PEN) 0.3 mg/0.3 mL DEVI Inject 0.3 mLs (0.3 mg total) into the muscle once. 1 Device 0  . ferrous sulfate 325 (65 FE) MG EC tablet Take 325 mg by mouth at bedtime.    . Melatonin 10 MG TABS Take 10 mg by mouth at bedtime as needed (sleep).    . Potassium 99 MG TABS Take 99 mg by mouth daily.     No current facility-administered medications on file prior to visit.     ROS ROS otherwise unremarkable unless listed above.  Physical Examination: BP 130/72   Pulse (!) 118   Temp 100 F (37.8 C) (Oral)   Resp 18   Ht 5\' 9"  (1.753 m)   Wt 248 lb (112.5 kg)   LMP 06/05/2017   SpO2 100%   BMI 36.62 kg/m  Ideal Body Weight: Weight in (lb) to have BMI = 25: 168.9  Physical Exam  Constitutional: She is oriented to person, place, and time. She appears well-developed and well-nourished. No distress.  HENT:  Head: Normocephalic and atraumatic.  Right Ear: External ear normal.  Left Ear: External ear normal.  Eyes: Pupils are equal, round, and reactive to light. Conjunctivae and EOM are normal.  Cardiovascular: Normal rate.  Pulmonary/Chest: Effort normal. No respiratory distress.  Musculoskeletal:       Left shoulder: She exhibits tenderness (lateral area of the shoulder). She exhibits normal range of motion, no swelling and no crepitus.  Mild pain incited without weakness with empty can test.   Neurological: She is alert and oriented to person, place, and time.  Skin: She is not  diaphoretic.  Psychiatric: She has a normal mood and affect. Her behavior is normal.     Assessment and Plan: Erica Lang is a 42 y.o. female  For cc of  Chief Complaint  Patient presents with  . Shoulder Pain    follow up on left shoulder      Gastroenteritis - Plan: ondansetron (ZOFRAN-ODT) 8 MG disintegrating tablet  Strain of left shoulder, subsequent encounter - Plan: ondansetron (ZOFRAN-ODT) 8 MG disintegrating tablet  Trena PlattStephanie English, PA-C [facility] 4/9/201910:24 AM

## 2018-04-13 ENCOUNTER — Encounter: Payer: Self-pay | Admitting: Physician Assistant

## 2018-04-13 ENCOUNTER — Ambulatory Visit (INDEPENDENT_AMBULATORY_CARE_PROVIDER_SITE_OTHER): Payer: BLUE CROSS/BLUE SHIELD | Admitting: Physician Assistant

## 2018-04-13 VITALS — BP 125/86 | HR 115 | Temp 99.5°F | Resp 17 | Ht 69.5 in | Wt 251.0 lb

## 2018-04-13 DIAGNOSIS — S46912D Strain of unspecified muscle, fascia and tendon at shoulder and upper arm level, left arm, subsequent encounter: Secondary | ICD-10-CM | POA: Diagnosis not present

## 2018-04-13 MED ORDER — CYCLOBENZAPRINE HCL 10 MG PO TABS: 5.0000 mg | ORAL_TABLET | Freq: Three times a day (TID) | ORAL | 2 refills | Status: DC | PRN

## 2018-04-13 NOTE — Progress Notes (Signed)
[facility] [address], [place] [id number] [phone]  Date:  04/13/2018   Name:  Erica Lang   DOB:  Jan 28, 1976   MRN:  [id number]  PCP:  Patient, No Pcp Per    History of Present Illness:  Erica Lang is a 42 y.o. female patient who presents to PCP with  Chief Complaint  Patient presents with  . Follow-up    shoulder pain      Patient is here for follow-up of her left-sided shoulder pain.  She has returned to work without any restrictions.  She reports that it is going well.  She can feel some pain with her shoulder however this is well controlled.  She denies any paresthesia.  She has been icing it at home.  She will use ibuprofen 800 mg once per day as needed.  She continues to do the stretches outside of work daily.  Patient Active Problem List   Diagnosis Date Noted  . Pyelonephritis 05/29/2016  . Sepsis (HCC) 05/29/2016  . Acute UTI 05/29/2016  . Headache   . Chest pain 11/01/2013  . Anemia   . Anxiety   . Hypertension   . Hidradenitis   . Obesity   . PCOS (polycystic ovarian syndrome)   . Asthma   . 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 Hx of varicella   . Persistent headaches 10/30/2013  . Left sided numbness 10/29/2013  . Postpartum hypertension 10/19/2013  . Postpartum edema 10/19/2013  . Dysmenorrhea 10/28/2007  . DYSFUNCTIONAL UTERINE BLEEDING 10/28/2007  . POLYCYSTIC OVARIAN DISEASE 05/03/1999    Past Medical History:  Diagnosis Date  . Anemia   . Anxiety   . Bilateral carpal tunnel syndrome    left only per patient  . Headache   . History of abnormal cervical Pap smear   . History of exercise stress test dr Eden Emmsnishan   11-11-2013--  normal/  good exercise capacity, no chest pain, no ischemia  . History of pre-eclampsia 11/ 2014   hypertension and postpartum pulmonary edema--  resolved  . IBS (irritable bowel syndrome)   . Mild asthma   . PCOS (polycystic ovarian syndrome)     Past Surgical History:  Procedure  Laterality Date  . CARPAL TUNNEL RELEASE Right 08/02/2015   Procedure: RIGHT HAND CARPAL TUNNEL RELEASE;  Surgeon: Bradly BienenstockFred Ortmann, MD;  [facility];  Service: Orthopedics;  Laterality: Right;  . CHOLECYSTECTOMY N/A 10/27/2014   Procedure: LAPAROSCOPIC CHOLECYSTECTOMY WITH INTRAOPERATIVE CHOLANGIOGRAM;  Surgeon: Atilano InaEric M Wilson, MD;  Location: [facility];  Service: General;  Laterality: N/A;  . dx laparoscopy    . I & D  LEFT VULVAR HEMATOMA, POSTPARTUM  07-05-2009  . ROBOTIC ASSISTED TOTAL HYSTERECTOMY WITH SALPINGECTOMY Bilateral 06/18/2017   Procedure: ROBOTIC ASSISTED TOTAL HYSTERECTOMY WITH SALPINGECTOMY;  Surgeon: Olivia Mackieaavon, Richard, MD;  Location: [facility];  Service: Gynecology;  Laterality: Bilateral;  . TRANSTHORACIC ECHOCARDIOGRAM  10-28-2013   normal LVF,  ef 60-65%,  mild AR , MR and , TR    Social History   Tobacco Use  . Smoking status: Never Smoker  . Smokeless tobacco: Never Used  Substance Use Topics  . Alcohol use: Yes    Alcohol/week: 0.0 oz    Comment: occasional  . Drug use: No    Family History  Problem Relation Age of Onset  . Stroke Mother   . Hypertension Mother   . Heart disease Mother   . Deep vein thrombosis Mother  both legs, arms and back, 2 blood clots migrted to her chest area  . Diabetes Mother     Allergies  Allergen Reactions  . Bee Venom Anaphylaxis    Medication list has been reviewed and updated.  Current Outpatient Medications on File Prior to Visit  Medication Sig Dispense Refill  . albuterol (PROVENTIL HFA;VENTOLIN HFA) 108 (90 BASE) MCG/ACT inhaler Inhale 2 puffs into the lungs every 6 (six) hours as needed for wheezing. 1 Inhaler 0  . Biotin 16109 MCG TBDP Take 10,000 mcg by mouth daily.    . Calcium Carb-Cholecalciferol (CALCIUM 600 + D PO) Take 1 tablet by mouth daily.    . calcium carbonate (TUMS - DOSED IN MG ELEMENTAL CALCIUM) 500 MG chewable tablet Chew 1 tablet by mouth daily as needed for indigestion or  heartburn.    Marland Kitchen EPINEPHrine (EPI-PEN) 0.3 mg/0.3 mL DEVI Inject 0.3 mLs (0.3 mg total) into the muscle once. 1 Device 0  . ferrous sulfate 325 (65 FE) MG EC tablet Take 325 mg by mouth at bedtime.    . Melatonin 10 MG TABS Take 10 mg by mouth at bedtime as needed (sleep).    . ondansetron (ZOFRAN-ODT) 8 MG disintegrating tablet Take 1 tablet (8 mg total) by mouth every 8 (eight) hours as needed for nausea. 30 tablet 0  . Potassium 99 MG TABS Take 99 mg by mouth daily.     No current facility-administered medications on file prior to visit.     ROS ROS otherwise unremarkable unless listed above.  Physical Examination: BP 125/86   Pulse (!) 115   Temp 99.5 F (37.5 C) (Oral)   Resp 17   Ht 5' 9.5" (1.765 m)   Wt 251 lb (113.9 kg)   LMP 06/05/2017   SpO2 98%   BMI 36.53 kg/m  Ideal Body Weight: Weight in (lb) to have BMI = 25: 171.4  Physical Exam  Constitutional: She is oriented to person, place, and time. She appears well-developed and well-nourished. No distress.  HENT:  Head: Normocephalic and atraumatic.  Right Ear: External ear normal.  Left Ear: External ear normal.  Eyes: Pupils are equal, round, and reactive to light. Conjunctivae and EOM are normal.  Cardiovascular: Normal rate.  Pulmonary/Chest: Effort normal. No respiratory distress.  Musculoskeletal:       Left shoulder: She exhibits bony tenderness (with deep palpation of the anterior shoulder joint at the [facility].  ). She exhibits normal range of motion and no tenderness.  Weakness and pain with empty can test. Fluid movement with shoulder external rotation.    Neurological: She is alert and oriented to person, place, and time.  Skin: She is not diaphoretic.  Psychiatric: She has a normal mood and affect. Her behavior is normal.   Assessment and Plan: Erica Lang is a 42 y.o. female who is here today for cc of  Chief Complaint  Patient presents with  . Follow-up    shoulder pain    Strain of left shoulder,  subsequent encounter - Plan: cyclobenzaprine (FLEXERIL) 10 MG tablet  Strain of left shoulder, initial encounter  Trena Platt, PA-C [facility] 4/18/20198:52 AM

## 2018-04-13 NOTE — Patient Instructions (Signed)
     IF you received an x-ray today, you will receive an invoice from [facility]. Please contact [facility] at [phone] with questions or concerns regarding your invoice.   IF you received labwork today, you will receive an invoice from [facility]. Please contact [facility] at [phone] with questions or concerns regarding your invoice.   Our billing staff will not be able to assist you with questions regarding bills from these companies.  You will be contacted with the lab results as soon as they are available. The fastest way to get your results is to activate your My Chart account. Instructions are located on the last page of this paperwork. If you have not heard from us regarding the results in 2 weeks, please contact this office.     

## 2019-03-14 DIAGNOSIS — J45901 Unspecified asthma with (acute) exacerbation: Secondary | ICD-10-CM | POA: Diagnosis not present

## 2019-03-14 DIAGNOSIS — J209 Acute bronchitis, unspecified: Secondary | ICD-10-CM | POA: Diagnosis not present

## 2019-07-04 DIAGNOSIS — N159 Renal tubulo-interstitial disease, unspecified: Secondary | ICD-10-CM | POA: Diagnosis not present

## 2019-07-04 DIAGNOSIS — R1010 Upper abdominal pain, unspecified: Secondary | ICD-10-CM | POA: Diagnosis not present

## 2019-10-07 DIAGNOSIS — Z20828 Contact with and (suspected) exposure to other viral communicable diseases: Secondary | ICD-10-CM | POA: Diagnosis not present

## 2019-11-29 DIAGNOSIS — J4520 Mild intermittent asthma, uncomplicated: Secondary | ICD-10-CM | POA: Insufficient documentation

## 2019-11-29 DIAGNOSIS — R0989 Other specified symptoms and signs involving the circulatory and respiratory systems: Secondary | ICD-10-CM | POA: Diagnosis not present

## 2019-11-29 DIAGNOSIS — D649 Anemia, unspecified: Secondary | ICD-10-CM | POA: Insufficient documentation

## 2019-11-29 DIAGNOSIS — F411 Generalized anxiety disorder: Secondary | ICD-10-CM | POA: Insufficient documentation

## 2019-11-29 DIAGNOSIS — R0981 Nasal congestion: Secondary | ICD-10-CM | POA: Diagnosis not present

## 2019-11-29 DIAGNOSIS — U071 COVID-19: Secondary | ICD-10-CM | POA: Diagnosis not present

## 2019-11-29 DIAGNOSIS — E6609 Other obesity due to excess calories: Secondary | ICD-10-CM | POA: Insufficient documentation

## 2019-11-29 DIAGNOSIS — J069 Acute upper respiratory infection, unspecified: Secondary | ICD-10-CM | POA: Diagnosis not present

## 2019-12-26 DIAGNOSIS — U071 COVID-19: Secondary | ICD-10-CM | POA: Diagnosis not present

## 2019-12-26 DIAGNOSIS — R5381 Other malaise: Secondary | ICD-10-CM | POA: Diagnosis not present

## 2019-12-26 DIAGNOSIS — J208 Acute bronchitis due to other specified organisms: Secondary | ICD-10-CM | POA: Diagnosis not present

## 2019-12-26 DIAGNOSIS — M940 Chondrocostal junction syndrome [Tietze]: Secondary | ICD-10-CM | POA: Diagnosis not present

## 2019-12-26 DIAGNOSIS — R7611 Nonspecific reaction to tuberculin skin test without active tuberculosis: Secondary | ICD-10-CM | POA: Diagnosis not present

## 2023-11-02 ENCOUNTER — Ambulatory Visit: Payer: BLUE CROSS/BLUE SHIELD

## 2023-11-02 ENCOUNTER — Ambulatory Visit
Admission: EM | Admit: 2023-11-02 | Discharge: 2023-11-02 | Disposition: A | Discharge status: Home / Self Care | Payer: BC Managed Care – PPO | Attending: Family Medicine | Admitting: Family Medicine

## 2023-11-02 DIAGNOSIS — G5602 Carpal tunnel syndrome, left upper limb: Secondary | ICD-10-CM

## 2023-11-02 DIAGNOSIS — M79602 Pain in left arm: Secondary | ICD-10-CM

## 2023-11-02 MED ORDER — KETOROLAC TROMETHAMINE 30 MG/ML IJ SOLN
30.0000 mg | Freq: Once | INTRAMUSCULAR | Status: AC
  Administered 2023-11-02: 30 mg via INTRAMUSCULAR

## 2023-11-02 MED ORDER — PREDNISONE 20 MG PO TABS: 40.0000 mg | ORAL_TABLET | Freq: Every day | ORAL | 0 refills | Status: AC

## 2023-11-02 NOTE — ED Triage Notes (Signed)
"  I am having left arm, numbness, tingling, for a few weeks now but it is just getting worse with left hand numbness, tingling, pain". Pain "seems to start at hand and radiate up". Occasional radiation to shoulder (left). No injury. No chest pain. Some occasional asthma flare up but not related to pain.

## 2023-11-02 NOTE — Discharge Instructions (Addendum)
You were seen today for left arm and hand pain.  I think this is likely severe carpal tunnel.  I have given you a shot of toradol today, and wrist brace.  I would wear this during work and sleep.   I have sent out prednisone to help with pain and swelling.  You may continue tylenol and motrin as well.  I recommend you ice the area.  You should call you hand specialist who did your carpal tunnel surgery previously for an appointment and evaluation.

## 2023-11-02 NOTE — ED Provider Notes (Signed)
Erica Lang    CSN: [id number] Arrival date & time: 11/02/23  0810      History   Chief Complaint Chief Complaint  Patient presents with   Left Arm Pain     With numbness/tingling    HPI Erica Lang is a 47 y.o. female.   For several weeks patient has been having pain in the left hand.  Some swelling off/on in the thumb and first 2 fingers, pain that shoots up her arm.  Numbness/pain shoots up her arm to her shoulder.  This keeps her up at night.  She does work with her hands, she is cooking, lifting, etc.  She is left handed.  She did have carpal tunnel in the right hand, had surgery.  She has been taking motrin, tyelnol without help.        Past Medical History:  Diagnosis Date   Anemia    Anxiety    Bilateral carpal tunnel syndrome    left only per patient   Headache    History of abnormal cervical Pap smear    History of exercise stress test dr Eden Emms   11-11-2013--  normal/  good exercise capacity, no chest pain, no ischemia   History of pre-eclampsia 11/ 2014   hypertension and postpartum pulmonary edema--  resolved   IBS (irritable bowel syndrome)    Mild asthma    PCOS (polycystic ovarian syndrome)     Patient Active Problem List   Diagnosis Date Noted   Obesity due to excess calories with serious comorbidity 11/29/2019   Generalized anxiety disorder 11/29/2019   Mild intermittent asthma without complication 11/29/2019   Normochromic normocytic anemia 11/29/2019   Pyelonephritis 05/29/2016   Sepsis (HCC) 05/29/2016   Acute UTI 05/29/2016   Headache    Chest pain 11/01/2013   Anemia    Anxiety    Hypertension    Hidradenitis    Asthma    Female infertility    Hx of varicella    Persistent headaches 10/30/2013   Left sided numbness 10/29/2013   Postpartum hypertension 10/19/2013   Postpartum edema 10/19/2013   Dysmenorrhea 10/28/2007   DYSFUNCTIONAL UTERINE BLEEDING 10/28/2007   POLYCYSTIC OVARIAN DISEASE 05/03/1999    PCOS (polycystic ovarian syndrome) 05/03/1999    Past Surgical History:  Procedure Laterality Date   CARPAL TUNNEL RELEASE Right 08/02/2015   Procedure: RIGHT HAND CARPAL TUNNEL RELEASE;  Surgeon: Bradly Bienenstock, MD;  Location: [facility];  Service: Orthopedics;  Laterality: Right;   CHOLECYSTECTOMY N/A 10/27/2014   Procedure: LAPAROSCOPIC CHOLECYSTECTOMY WITH INTRAOPERATIVE CHOLANGIOGRAM;  Surgeon: Atilano Ina, MD;  Location: [facility];  Service: General;  Laterality: N/A;   dx laparoscopy     I & D  LEFT VULVAR HEMATOMA, POSTPARTUM  07-05-2009   ROBOTIC ASSISTED TOTAL HYSTERECTOMY WITH SALPINGECTOMY Bilateral 06/18/2017   Procedure: ROBOTIC ASSISTED TOTAL HYSTERECTOMY WITH SALPINGECTOMY;  Surgeon: Olivia Mackie, MD;  Location: [facility];  Service: Gynecology;  Laterality: Bilateral;   TRANSTHORACIC ECHOCARDIOGRAM  10-28-2013   normal LVF,  ef 60-65%,  mild AR , MR and , TR    OB History     Gravida  3   Para  2   Term  2   Preterm      AB  1   Living  2      SAB  1   IAB      Ectopic      Multiple      Live Births  2  Home Medications    Prior to Admission medications   Medication Sig Start Date End Date Taking? Authorizing Provider  albuterol (VENTOLIN HFA) 108 (90 Base) MCG/ACT inhaler Inhale 2 puffs into the lungs every 4 (four) hours as needed for wheezing or shortness of breath. 11/29/19  Yes [provider]  Biotin 16109 MCG TBDP Take 10,000 mcg by mouth daily.   Yes [provider]  Calcium Carb-Cholecalciferol (CALCIUM 600 + D PO) Take 1 tablet by mouth daily.   Yes [provider]  calcium carbonate (TUMS - DOSED IN MG ELEMENTAL CALCIUM) 500 MG chewable tablet Chew 1 tablet by mouth daily as needed for indigestion or heartburn.   Yes [provider]  cetirizine-pseudoephedrine (ZYRTEC-D) 5-120 MG tablet Take 1 tablet by mouth 2 (two) times daily. 11/29/19  Yes [provider]  Melatonin  10 MG TABS Take 10 mg by mouth at bedtime as needed (sleep).   Yes [provider]  Potassium 99 MG TABS Take 99 mg by mouth daily.   Yes [provider]  albuterol (PROVENTIL HFA;VENTOLIN HFA) 108 (90 BASE) MCG/ACT inhaler Inhale 2 puffs into the lungs every 6 (six) hours as needed for wheezing. 12/08/12   Le, Thao P, DO  cyclobenzaprine (FLEXERIL) 10 MG tablet Take 0.5-1 tablets (5-10 mg total) by mouth 3 (three) times daily as needed. 04/13/18   Trena Platt D, PA  EPINEPHrine (EPI-PEN) 0.3 mg/0.3 mL DEVI Inject 0.3 mLs (0.3 mg total) into the muscle once. 04/28/12   Rickard Patience, PA-C  ferrous sulfate 325 (65 FE) MG EC tablet Take 325 mg by mouth at bedtime.    [provider]  ondansetron (ZOFRAN-ODT) 8 MG disintegrating tablet Take 1 tablet (8 mg total) by mouth every 8 (eight) hours as needed for nausea. 03/31/18   Garnetta Buddy, PA    Family History Family History  Problem Relation Age of Onset   Stroke Mother    Hypertension Mother    Heart disease Mother    Deep vein thrombosis Mother        both legs, arms and back, 2 blood clots migrted to her chest area   Diabetes Mother     Social History Social History   Tobacco Use   Smoking status: Never    Passive exposure: Never   Smokeless tobacco: Never  Vaping Use   Vaping status: Never Used  Substance Use Topics   Alcohol use: Yes    Alcohol/week: 0.0 standard drinks of alcohol    Comment: occasional   Drug use: No     Allergies   Bee venom   Review of Systems Review of Systems  Constitutional: Negative.   HENT: Negative.    Respiratory: Negative.    Cardiovascular: Negative.   Gastrointestinal: Negative.   Genitourinary: Negative.   Musculoskeletal:  Positive for joint swelling.  Psychiatric/Behavioral: Negative.       Physical Exam Triage Vital Signs ED Triage Vitals  Encounter Vitals Group     BP 11/02/23 0822 127/81     Systolic BP Percentile --      Diastolic  BP Percentile --      Pulse Rate 11/02/23 0822 87     Resp 11/02/23 0822 18     Temp 11/02/23 0822 98.1 F (36.7 C)     Temp Source 11/02/23 0822 Oral     SpO2 11/02/23 0822 97 %     Weight 11/02/23 0820 251 lb 1.7 oz (113.9 kg)  Height 11/02/23 0820 5' 9.5" (1.765 m)     Head Circumference --      Peak Flow --      Pain Score 11/02/23 0816 9     Pain Loc --      Pain Education --      Exclude from Growth Chart --    No data found.  Updated Vital Signs BP 127/81 (BP Location: Right Arm)   Pulse 87   Temp 98.1 F (36.7 C) (Oral)   Resp 18   Ht 5' 9.5" (1.765 m)   Wt 113.9 kg   LMP 06/05/2017   SpO2 97%   BMI 36.55 kg/m   Visual Acuity Right Eye Distance:   Left Eye Distance:   Bilateral Distance:    Right Eye Near:   Left Eye Near:    Bilateral Near:     Physical Exam Constitutional:      Appearance: Normal appearance.  Cardiovascular:     Rate and Rhythm: Normal rate and regular rhythm.  Pulmonary:     Effort: Pulmonary effort is normal.     Breath sounds: Normal breath sounds.  Musculoskeletal:     Comments: No spinous tenderness;  +TTP to the left upper back/scapular area, left shoulder, arm, wrist and hand;  very TTP to the left hand and wrist;   + swelling to the left hand;  Phalens and tinels positive on the left;  Spurling negative on the left;   Neurological:     General: No focal deficit present.     Mental Status: She is alert.  Psychiatric:        Mood and Affect: Mood normal.      UC Treatments / Results  Labs (all labs ordered are listed, but only abnormal results are displayed) Labs Reviewed - No data to display  EKG NSR;  normal EKG.   Radiology No results found.  Procedures Procedures (including critical Lang time)  Medications Ordered in UC Medications  ketorolac (TORADOL) 30 MG/ML injection 30 mg (30 mg Intramuscular Given 11/02/23 0857)    Initial Impression / Assessment and Plan / UC Course  I have reviewed the  triage vital signs and the nursing notes.  Pertinent labs & imaging results that were available during my Lang of the patient were reviewed by me and considered in my medical decision making (see chart for details).   Final Clinical Impressions(s) / UC Diagnoses   Final diagnoses:  Left arm pain  Carpal tunnel syndrome on left     Discharge Instructions      You were seen today for left arm and hand pain.  I think this is likely severe carpal tunnel.  I have given you a shot of toradol today, and wrist brace.  I would wear this during work and sleep.   I have sent out prednisone to help with pain and swelling.  You may continue tylenol and motrin as well.  I recommend you ice the area.  You should call you hand specialist who did your carpal tunnel surgery previously for an appointment and evaluation.     ED Prescriptions     Medication Sig Dispense Auth. Provider   predniSONE (DELTASONE) 20 MG tablet Take 2 tablets (40 mg total) by mouth daily for 5 days. 10 tablet Jannifer Franklin, MD      PDMP not reviewed this encounter.   Jannifer Franklin, MD 11/02/23 0900

## 2023-12-08 ENCOUNTER — Encounter: Payer: Self-pay | Admitting: Family Medicine

## 2023-12-08 ENCOUNTER — Ambulatory Visit: Payer: BC Managed Care – PPO | Admitting: Family Medicine

## 2023-12-08 VITALS — BP 121/79 | HR 88 | Temp 98.4°F | Resp 16 | Ht 69.5 in | Wt 183.8 lb

## 2023-12-08 DIAGNOSIS — G9332 Myalgic encephalomyelitis/chronic fatigue syndrome: Secondary | ICD-10-CM

## 2023-12-08 DIAGNOSIS — G5602 Carpal tunnel syndrome, left upper limb: Secondary | ICD-10-CM

## 2023-12-08 DIAGNOSIS — U099 Post covid-19 condition, unspecified: Secondary | ICD-10-CM

## 2023-12-08 DIAGNOSIS — R2990 Unspecified symptoms and signs involving the nervous system: Secondary | ICD-10-CM | POA: Diagnosis not present

## 2023-12-08 DIAGNOSIS — Z23 Encounter for immunization: Secondary | ICD-10-CM | POA: Diagnosis not present

## 2023-12-08 DIAGNOSIS — Z7689 Persons encountering health services in other specified circumstances: Secondary | ICD-10-CM

## 2023-12-08 NOTE — Progress Notes (Signed)
New Patient Office Visit  Subjective    Patient ID: Erica Lang, female    DOB: [birth year]  Age: 47 y.o. MRN: [id number]  CC:  Chief Complaint  Patient presents with   Establish Care    Memory flog, fatigue from covid, have not had any mental care in a long time , carpel tunnel referral    HPI Erica Lang presents to establish care and for complaint of post-covid symptoms of memory fog and fatigue. She had covid in 2019/2020 and sx persist. She has not had insurance and therefore has not had any follow up. Patient also reports that she has had carpal tunnel sx. She had surgery on one hand but her left hand has had persistent and increasingly worsening sx. She is left hand dominant.    Outpatient Encounter Medications as of 12/08/2023  Medication Sig   albuterol (PROVENTIL HFA;VENTOLIN HFA) 108 (90 BASE) MCG/ACT inhaler Inhale 2 puffs into the lungs every 6 (six) hours as needed for wheezing.   Biotin 84696 MCG TBDP Take 10,000 mcg by mouth daily.   Calcium Carb-Cholecalciferol (CALCIUM 600 + D PO) Take 1 tablet by mouth daily.   calcium carbonate (TUMS - DOSED IN MG ELEMENTAL CALCIUM) 500 MG chewable tablet Chew 1 tablet by mouth daily as needed for indigestion or heartburn.   cetirizine-pseudoephedrine (ZYRTEC-D) 5-120 MG tablet Take 1 tablet by mouth 2 (two) times daily.   cyclobenzaprine (FLEXERIL) 10 MG tablet Take 0.5-1 tablets (5-10 mg total) by mouth 3 (three) times daily as needed.   EPINEPHrine (EPI-PEN) 0.3 mg/0.3 mL DEVI Inject 0.3 mLs (0.3 mg total) into the muscle once.   ferrous sulfate 325 (65 FE) MG EC tablet Take 325 mg by mouth at bedtime.   Melatonin 10 MG TABS Take 10 mg by mouth at bedtime as needed (sleep).   ondansetron (ZOFRAN-ODT) 8 MG disintegrating tablet Take 1 tablet (8 mg total) by mouth every 8 (eight) hours as needed for nausea.   Potassium 99 MG TABS Take 99 mg by mouth daily.   No facility-administered encounter medications on file as of  12/08/2023.    Past Medical History:  Diagnosis Date   Anemia    Anxiety    Bilateral carpal tunnel syndrome    left only per patient   Headache    History of abnormal cervical Pap smear    History of exercise stress test dr Eden Emms   11-11-2013--  normal/  good exercise capacity, no chest pain, no ischemia   History of pre-eclampsia 11/ 2014   hypertension and postpartum pulmonary edema--  resolved   IBS (irritable bowel syndrome)    Mild asthma    PCOS (polycystic ovarian syndrome)     Past Surgical History:  Procedure Laterality Date   CARPAL TUNNEL RELEASE Right 08/02/2015   Procedure: RIGHT HAND CARPAL TUNNEL RELEASE;  Surgeon: Bradly Bienenstock, MD;  Location: [facility];  Service: Orthopedics;  Laterality: Right;   CHOLECYSTECTOMY N/A 10/27/2014   Procedure: LAPAROSCOPIC CHOLECYSTECTOMY WITH INTRAOPERATIVE CHOLANGIOGRAM;  Surgeon: Atilano Ina, MD;  Location: [facility];  Service: General;  Laterality: N/A;   dx laparoscopy     I & D  LEFT VULVAR HEMATOMA, POSTPARTUM  07-05-2009   ROBOTIC ASSISTED TOTAL HYSTERECTOMY WITH SALPINGECTOMY Bilateral 06/18/2017   Procedure: ROBOTIC ASSISTED TOTAL HYSTERECTOMY WITH SALPINGECTOMY;  Surgeon: Olivia Mackie, MD;  Location: [facility];  Service: Gynecology;  Laterality: Bilateral;   TRANSTHORACIC ECHOCARDIOGRAM  10-28-2013   normal LVF,  ef  60-65%,  mild AR , MR and , TR    Family History  Problem Relation Age of Onset   Stroke Mother    Hypertension Mother    Heart disease Mother    Deep vein thrombosis Mother        both legs, arms and back, 2 blood clots migrted to her chest area   Diabetes Mother     Social History   Socioeconomic History   Marital status: Married    Spouse name: Not on file   Number of children: 2   Years of education: Not on file   Highest education level: Not on file  Occupational History    Employer: [employer]    Comment: [employer]  Tobacco Use   Smoking status:  Never    Passive exposure: Never   Smokeless tobacco: Never  Vaping Use   Vaping status: Never Used  Substance and Sexual Activity   Alcohol use: Yes    Alcohol/week: 0.0 standard drinks of alcohol    Comment: occasional   Drug use: No   Sexual activity: Yes    Birth control/protection: Injection    Comment: depo  Other Topics Concern   Not on file  Social History Narrative   Not on file   Social Drivers of Health   Financial Resource Strain: Low Risk  (12/08/2023)   Overall Financial Resource Strain (CARDIA)    Difficulty of Paying Living Expenses: Not hard at all  Food Insecurity: No Food Insecurity (12/08/2023)   Hunger Vital Sign    Worried About Running Out of Food in the Last Year: Never true    Ran Out of Food in the Last Year: Never true  Transportation Needs: No Transportation Needs (12/08/2023)   PRAPARE - Administrator, Civil Service (Medical): No    Lack of Transportation (Non-Medical): No  Physical Activity: Sufficiently Active (12/08/2023)   Exercise Vital Sign    Days of Exercise per Week: 5 days    Minutes of Exercise per Session: 30 min  Stress: No Stress Concern Present (12/08/2023)   [employer] of Occupational Health - Occupational Stress Questionnaire    Feeling of Stress : Only a little  Social Connections: Moderately Integrated (12/08/2023)   Social Connection and Isolation Panel [NHANES]    Frequency of Communication with Friends and Family: More than three times a week    Frequency of Social Gatherings with Friends and Family: Twice a week    Attends Religious Services: More than 4 times per year    Active Member of [employer] or Organizations: No    Attends Banker Meetings: Never    Marital Status: Married  Catering manager Violence: Not At Risk (12/08/2023)   Humiliation, Afraid, Rape, and Kick questionnaire    Fear of Current or Ex-Partner: No    Emotionally Abused: No    Physically Abused: No    Sexually  Abused: No    Review of Systems  Constitutional:  Positive for malaise/fatigue.  All other systems reviewed and are negative.       Objective    BP 121/79 (BP Location: Right Arm, Patient Position: Sitting, Cuff Size: Normal)   Pulse 88   Temp 98.4 F (36.9 C) (Oral)   Resp 16   Ht 5' 9.5" (1.765 m)   Wt 183 lb 12.8 oz (83.4 kg)   LMP 06/05/2017   SpO2 98%   BMI 26.75 kg/m   Physical Exam Vitals and nursing  note reviewed.  Constitutional:      General: She is not in acute distress. Cardiovascular:     Rate and Rhythm: Normal rate and regular rhythm.  Pulmonary:     Effort: Pulmonary effort is normal.     Breath sounds: Normal breath sounds.  Neurological:     General: No focal deficit present.     Mental Status: She is alert and oriented to person, place, and time.         Assessment & Plan:   Post-COVID chronic fatigue -     Ambulatory referral to Neurology -     CMP14+EGFR -     CBC with Differential/Platelet -     TSH -     VITAMIN D 25 Hydroxy (Vit-D Deficiency, Fractures)  Post-COVID chronic neurologic symptoms -     Ambulatory referral to Neurology  Carpal tunnel syndrome of left wrist -     Ambulatory referral to Orthopedic Surgery  Encounter for immunization -     Flu vaccine trivalent PF, 6mos and older(Flulaval,Afluria,Fluarix,Fluzone)  Immunization due -     Tdap vaccine greater than or equal to 7yo IM  Encounter to establish care     Return in about 3 months (around 03/07/2024) for follow up.   Tommie Raymond, MD

## 2023-12-09 ENCOUNTER — Other Ambulatory Visit: Payer: Self-pay | Admitting: Family Medicine

## 2023-12-09 LAB — CBC WITH DIFFERENTIAL/PLATELET
Basophils Absolute: 0 10*3/uL (ref 0.0–0.2)
Basos: 1 %
EOS (ABSOLUTE): 0.1 10*3/uL (ref 0.0–0.4)
Eos: 1 %
Hematocrit: 36.6 % (ref 34.0–46.6)
Hemoglobin: 12.1 g/dL (ref 11.1–15.9)
Immature Grans (Abs): 0 10*3/uL (ref 0.0–0.1)
Immature Granulocytes: 0 %
Lymphocytes Absolute: 2.2 10*3/uL (ref 0.7–3.1)
Lymphs: 35 %
MCH: 32.4 pg (ref 26.6–33.0)
MCHC: 33.1 g/dL (ref 31.5–35.7)
MCV: 98 fL — ABNORMAL HIGH (ref 79–97)
Monocytes Absolute: 0.6 10*3/uL (ref 0.1–0.9)
Monocytes: 9 %
Neutrophils Absolute: 3.5 10*3/uL (ref 1.4–7.0)
Neutrophils: 54 %
Platelets: 223 10*3/uL (ref 150–450)
RBC: 3.73 x10E6/uL — ABNORMAL LOW (ref 3.77–5.28)
RDW: 13.4 % (ref 11.7–15.4)
WBC: 6.4 10*3/uL (ref 3.4–10.8)

## 2023-12-09 LAB — CMP14+EGFR
ALT: 16 [IU]/L (ref 0–32)
AST: 16 [IU]/L (ref 0–40)
Albumin: 4.6 g/dL (ref 3.9–4.9)
Alkaline Phosphatase: 62 [IU]/L (ref 44–121)
BUN/Creatinine Ratio: 15 (ref 9–23)
BUN: 10 mg/dL (ref 6–24)
Bilirubin Total: 0.4 mg/dL (ref 0.0–1.2)
CO2: 22 mmol/L (ref 20–29)
Calcium: 9.9 mg/dL (ref 8.7–10.2)
Chloride: 99 mmol/L (ref 96–106)
Creatinine, Ser: 0.65 mg/dL (ref 0.57–1.00)
Globulin, Total: 2.7 g/dL (ref 1.5–4.5)
Glucose: 72 mg/dL (ref 70–99)
Potassium: 4.1 mmol/L (ref 3.5–5.2)
Sodium: 137 mmol/L (ref 134–144)
Total Protein: 7.3 g/dL (ref 6.0–8.5)
eGFR: 108 mL/min/{1.73_m2} (ref >59)

## 2023-12-09 LAB — TSH: TSH: 1.22 u[IU]/mL (ref 0.450–4.500)

## 2023-12-09 LAB — VITAMIN D 25 HYDROXY (VIT D DEFICIENCY, FRACTURES): Vit D, 25-Hydroxy: 25.1 ng/mL — ABNORMAL LOW (ref 30.0–100.0)

## 2023-12-09 MED ORDER — VITAMIN D (ERGOCALCIFEROL) 1.25 MG (50000 UNIT) PO CAPS: 50000.0000 [IU] | ORAL_CAPSULE | ORAL | 0 refills | Status: AC

## 2023-12-11 ENCOUNTER — Encounter: Payer: Self-pay | Admitting: Family Medicine

## 2023-12-18 ENCOUNTER — Encounter: Payer: Self-pay | Admitting: Orthopaedic Surgery

## 2023-12-18 ENCOUNTER — Ambulatory Visit: Payer: BC Managed Care – PPO | Admitting: Orthopaedic Surgery

## 2023-12-18 DIAGNOSIS — G5602 Carpal tunnel syndrome, left upper limb: Secondary | ICD-10-CM | POA: Diagnosis not present

## 2023-12-18 MED ORDER — GABAPENTIN 100 MG PO CAPS: 200.0000 mg | ORAL_CAPSULE | Freq: Every evening | ORAL | 3 refills | Status: AC | PRN

## 2023-12-18 NOTE — Progress Notes (Signed)
Office Visit Note   Patient: Erica Lang           Date of Birth: 1976-01-22           MRN: [id number] Visit Date: 12/18/2023              Requested by: Georganna Skeans, MD [address] [place],  [place] [address] PCP: Georganna Skeans, MD   Assessment & Plan: Visit Diagnoses:  1. Left carpal tunnel syndrome     Plan: Erica Lang is a 47 year old female with left carpal tunnel syndrome.  Due to worsening of her symptoms she has elected to move forward with a left carpal tunnel release.  Erica Lang will call the patient to confirm surgery date.  In the meantime I have prescribed gabapentin which will hopefully help with symptoms.  Follow-Up Instructions: No follow-ups on file.   Orders:  No orders of the defined types were placed in this encounter.  Meds ordered this encounter  Medications   gabapentin (NEURONTIN) 100 MG capsule    Sig: Take 2-3 capsules (200-300 mg total) by mouth at bedtime as needed.    Dispense:  30 capsule    Refill:  3      Procedures: No procedures performed   Clinical Data: No additional findings.   Subjective: Chief Complaint  Patient presents with   Left Wrist - Pain    HPI Erica Lang is a 47 year old female here for evaluation of 1 month of worsening sinus carpal tunnel symptoms in the left hand.  She had EMGs in 2016 which showed moderate left carpal tunnel syndrome.  Her symptoms have gotten worse.  She underwent right carpal tunnel release in 2016 by Dr. Orlan Leavens.  She has been wearing a splint on the left wrist at night.  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  She recently tried a prednisone Dosepak which only provided relief for 1 month.  Review of Systems  Constitutional: Negative.   HENT: Negative.    Eyes: Negative.   Respiratory: Negative.    Cardiovascular: Negative.   Endocrine: Negative.   Musculoskeletal: Negative.   Neurological: Negative.   Hematological: Negative.   Psychiatric/Behavioral: Negative.     All other systems reviewed and are negative.    Objective: Vital Signs: LMP 06/05/2017   Physical Exam Vitals and nursing note reviewed.  Constitutional:      Appearance: She is well-developed.  HENT:     Head: Normocephalic and atraumatic.  Pulmonary:     Effort: Pulmonary effort is normal.  Abdominal:     Palpations: Abdomen is soft.  Musculoskeletal:     Cervical back: Neck supple.  Skin:    General: Skin is warm.     Capillary Refill: Capillary refill takes less than 2 seconds.  Neurological:     Mental Status: She is alert and oriented to person, place, and time.  Psychiatric:        Behavior: Behavior normal.        Thought Content: Thought content normal.        Judgment: Judgment normal.     Ortho Exam Exam of the left hand shows positive carpal tunnel compressive signs.  No muscle atrophy. Specialty Comments:  No specialty comments available.  Imaging: No results found.   PMFS History: Patient Active Problem List   Diagnosis Date Noted   Left carpal tunnel syndrome 12/18/2023   Obesity due to excess calories with serious comorbidity 11/29/2019   Generalized  anxiety disorder 11/29/2019   Mild intermittent asthma without complication 11/29/2019   Normochromic normocytic anemia 11/29/2019   Pyelonephritis 05/29/2016   Sepsis (HCC) 05/29/2016   Acute UTI 05/29/2016   Headache    Chest pain 11/01/2013   Anemia    Anxiety    Hypertension    Hidradenitis    Asthma    Female infertility    Hx of varicella    Persistent headaches 10/30/2013   Left sided numbness 10/29/2013   Postpartum hypertension 10/19/2013   Postpartum edema 10/19/2013   Dysmenorrhea 10/28/2007   DYSFUNCTIONAL UTERINE BLEEDING 10/28/2007   POLYCYSTIC OVARIAN DISEASE 05/03/1999   PCOS (polycystic ovarian syndrome) 05/03/1999   Past Medical History:  Diagnosis Date   Anemia    Anxiety    Bilateral carpal tunnel syndrome    left only per patient   Headache    History of  abnormal cervical Pap smear    History of exercise stress test dr Eden Emms   11-11-2013--  normal/  good exercise capacity, no chest pain, no ischemia   History of pre-eclampsia 11/ 2014   hypertension and postpartum pulmonary edema--  resolved   IBS (irritable bowel syndrome)    Mild asthma    PCOS (polycystic ovarian syndrome)     Family History  Problem Relation Age of Onset   Stroke Mother    Hypertension Mother    Heart disease Mother    Deep vein thrombosis Mother        both legs, arms and back, 2 blood clots migrted to her chest area   Diabetes Mother     Past Surgical History:  Procedure Laterality Date   CARPAL TUNNEL RELEASE Right 08/02/2015   Procedure: RIGHT HAND CARPAL TUNNEL RELEASE;  Surgeon: Bradly Bienenstock, MD;  Location: [facility] [place];  Service: Orthopedics;  Laterality: Right;   CHOLECYSTECTOMY N/A 10/27/2014   Procedure: LAPAROSCOPIC CHOLECYSTECTOMY WITH INTRAOPERATIVE CHOLANGIOGRAM;  Surgeon: Atilano Ina, MD;  Location: [facility];  Service: General;  Laterality: N/A;   dx laparoscopy     I & D  LEFT VULVAR HEMATOMA, POSTPARTUM  07-05-2009   ROBOTIC ASSISTED TOTAL HYSTERECTOMY WITH SALPINGECTOMY Bilateral 06/18/2017   Procedure: ROBOTIC ASSISTED TOTAL HYSTERECTOMY WITH SALPINGECTOMY;  Surgeon: Olivia Mackie, MD;  Location: [facility];  Service: Gynecology;  Laterality: Bilateral;   TRANSTHORACIC ECHOCARDIOGRAM  10-28-2013   normal LVF,  ef 60-65%,  mild AR , MR and , TR   Social History   Occupational History    Employer: [employer]    Comment: [employer]  Tobacco Use   Smoking status: Never    Passive exposure: Never   Smokeless tobacco: Never  Vaping Use   Vaping status: Never Used  Substance and Sexual Activity   Alcohol use: Yes    Alcohol/week: 0.0 standard drinks of alcohol    Comment: occasional   Drug use: No   Sexual activity: Yes    Birth control/protection: Injection    Comment: depo

## 2024-01-07 ENCOUNTER — Ambulatory Visit: Payer: BLUE CROSS/BLUE SHIELD | Admitting: Family Medicine

## 2024-01-13 ENCOUNTER — Telehealth: Payer: Self-pay | Admitting: Orthopaedic Surgery

## 2024-01-13 NOTE — Telephone Encounter (Signed)
 Patient would like a timeline of recovery for her left carpal tunnel release.  Please call  [phone]

## 2024-01-14 NOTE — Telephone Encounter (Signed)
Called and relayed via voicemail.

## 2024-01-14 NOTE — Telephone Encounter (Signed)
3-4 weeks

## 2024-01-19 DIAGNOSIS — Z1231 Encounter for screening mammogram for malignant neoplasm of breast: Secondary | ICD-10-CM | POA: Diagnosis not present

## 2024-01-19 DIAGNOSIS — Z90711 Acquired absence of uterus with remaining cervical stump: Secondary | ICD-10-CM | POA: Diagnosis not present

## 2024-01-19 DIAGNOSIS — Z1331 Encounter for screening for depression: Secondary | ICD-10-CM | POA: Diagnosis not present

## 2024-01-19 DIAGNOSIS — Z01419 Encounter for gynecological examination (general) (routine) without abnormal findings: Secondary | ICD-10-CM | POA: Diagnosis not present

## 2024-01-19 DIAGNOSIS — R350 Frequency of micturition: Secondary | ICD-10-CM | POA: Diagnosis not present

## 2024-01-22 ENCOUNTER — Telehealth: Payer: Self-pay | Admitting: Orthopaedic Surgery

## 2024-01-22 NOTE — Telephone Encounter (Signed)
[employer] forms received. To [employer].

## 2024-01-25 ENCOUNTER — Telehealth: Payer: Self-pay | Admitting: Orthopaedic Surgery

## 2024-01-25 NOTE — Telephone Encounter (Signed)
[employer] forms received. To [employer].

## 2024-02-04 ENCOUNTER — Encounter: Payer: BC Managed Care – PPO | Admitting: Physician Assistant

## 2024-02-04 ENCOUNTER — Other Ambulatory Visit: Payer: Self-pay | Admitting: Physician Assistant

## 2024-02-04 MED ORDER — HYDROCODONE-ACETAMINOPHEN 5-325 MG PO TABS: 1.0000 | ORAL_TABLET | Freq: Three times a day (TID) | ORAL | 0 refills | Status: AC | PRN

## 2024-02-04 MED ORDER — ONDANSETRON HCL 4 MG PO TABS: 4.0000 mg | ORAL_TABLET | Freq: Three times a day (TID) | ORAL | 0 refills | Status: AC | PRN

## 2024-02-11 DIAGNOSIS — G5602 Carpal tunnel syndrome, left upper limb: Secondary | ICD-10-CM | POA: Diagnosis not present

## 2024-02-19 ENCOUNTER — Ambulatory Visit: Payer: BC Managed Care – PPO | Admitting: Physician Assistant

## 2024-02-19 DIAGNOSIS — Z9889 Other specified postprocedural states: Secondary | ICD-10-CM

## 2024-02-19 DIAGNOSIS — G5602 Carpal tunnel syndrome, left upper limb: Secondary | ICD-10-CM | POA: Diagnosis not present

## 2024-02-19 MED ORDER — ACETAMINOPHEN-CODEINE 300-30 MG PO TABS: 1.0000 | ORAL_TABLET | Freq: Two times a day (BID) | ORAL | 0 refills | Status: DC | PRN

## 2024-02-19 NOTE — Progress Notes (Signed)
Post-Op Visit Note   Patient: Erica Lang           Date of Birth: April 08, 1976           MRN: [id number] Visit Date: 02/19/2024 PCP: Georganna Skeans, MD   Assessment & Plan:  Chief Complaint:  Chief Complaint  Patient presents with   Left Wrist - Follow-up    Carpal tunnel release 02/11/2024   Visit Diagnoses:  1. Left carpal tunnel syndrome   2. S/P carpal tunnel release     Plan: Patient is a pleasant 48 year old female who comes in today 1 week status post left carpal tunnel release 02/11/2024.  This was for moderate compression of median nerve.  She is still having a lot of pain.  She has been taking hydrocodone but ran out.  She still having paresthesias to the thumb, index and long fingers.  Examination of her left hand reveals a well-healing surgical incision with nylon sutures in place.  No evidence of infection or cellulitis.  Fingers warm well-perfused.  Mild swelling throughout the hand.  Decree sensation to the thumb, index and long fingers.  Today, her wound was cleaned and recovered.  She will wear a Velcro wrist splint for the next week but may begin nerve gliding exercises.  No heavy lifting or submerging her hand underwater for another 3 weeks.  Follow-up next week for suture removal.  We have discussed weaning the Tylenol 3 for pain sent in a prescription for this.  Follow-up next week for suture removal.  Call with concerns or questions.    Follow-Up Instructions: Return in about 1 week (around 02/26/2024).   Orders:  No orders of the defined types were placed in this encounter.  Meds ordered this encounter  Medications   acetaminophen-codeine (TYLENOL #3) 300-30 MG tablet    Sig: Take 1-2 tablets by mouth 2 (two) times daily as needed.    Dispense:  30 tablet    Refill:  0    Imaging: No new imaging  PMFS History: Patient Active Problem List   Diagnosis Date Noted   Left carpal tunnel syndrome 12/18/2023   Obesity due to excess calories with serious  comorbidity 11/29/2019   Generalized anxiety disorder 11/29/2019   Mild intermittent asthma without complication 11/29/2019   Normochromic normocytic anemia 11/29/2019   Pyelonephritis 05/29/2016   Sepsis (HCC) 05/29/2016   Acute UTI 05/29/2016   Headache    Chest pain 11/01/2013   Anemia    Anxiety    Hypertension    Hidradenitis    Asthma    Female infertility    Hx of varicella    Persistent headaches 10/30/2013   Left sided numbness 10/29/2013   Postpartum hypertension 10/19/2013   Postpartum edema 10/19/2013   Dysmenorrhea 10/28/2007   DYSFUNCTIONAL UTERINE BLEEDING 10/28/2007   POLYCYSTIC OVARIAN DISEASE 05/03/1999   PCOS (polycystic ovarian syndrome) 05/03/1999   Past Medical History:  Diagnosis Date   Anemia    Anxiety    Bilateral carpal tunnel syndrome    left only per patient   Headache    History of abnormal cervical Pap smear    History of exercise stress test dr Eden Emms   11-11-2013--  normal/  good exercise capacity, no chest pain, no ischemia   History of pre-eclampsia 11/ 2014   hypertension and postpartum pulmonary edema--  resolved   IBS (irritable bowel syndrome)    Mild asthma    PCOS (polycystic ovarian syndrome)     Family History  Problem Relation Age of Onset   Stroke Mother    Hypertension Mother    Heart disease Mother    Deep vein thrombosis Mother        both legs, arms and back, 2 blood clots migrted to her chest area   Diabetes Mother     Past Surgical History:  Procedure Laterality Date   CARPAL TUNNEL RELEASE Right 08/02/2015   Procedure: RIGHT HAND CARPAL TUNNEL RELEASE;  Surgeon: Bradly Bienenstock, MD;  Location[facility];  Service: Orthopedics;  Laterality: Right;   CHOLECYSTECTOMY N/A 10/27/2014   Procedure: LAPAROSCOPIC CHOLECYSTECTOMY WITH INTRAOPERATIVE CHOLANGIOGRAM;  Surgeon: Atilano Ina, MD;  Location: [facility];  Service: General;  Laterality: N/A;   dx laparoscopy     I & D  LEFT VULVAR HEMATOMA, POSTPARTUM   07-05-2009   ROBOTIC ASSISTED TOTAL HYSTERECTOMY WITH SALPINGECTOMY Bilateral 06/18/2017   Procedure: ROBOTIC ASSISTED TOTAL HYSTERECTOMY WITH SALPINGECTOMY;  Surgeon: Olivia Mackie, MD;  Location: [facility];  Service: Gynecology;  Laterality: Bilateral;   TRANSTHORACIC ECHOCARDIOGRAM  10-28-2013   normal LVF,  ef 60-65%,  mild AR , MR and , TR   Social History   Occupational History    Employer: [employer]    Comment: [employer]  Tobacco Use   Smoking status: Never    Passive exposure: Never   Smokeless tobacco: Never  Vaping Use   Vaping status: Never Used  Substance and Sexual Activity   Alcohol use: Yes    Alcohol/week: 0.0 standard drinks of alcohol    Comment: occasional   Drug use: No   Sexual activity: Yes    Birth control/protection: Injection    Comment: depo

## 2024-02-26 ENCOUNTER — Ambulatory Visit: Payer: BC Managed Care – PPO | Admitting: Orthopaedic Surgery

## 2024-02-26 DIAGNOSIS — Z9889 Other specified postprocedural states: Secondary | ICD-10-CM

## 2024-02-26 DIAGNOSIS — G5602 Carpal tunnel syndrome, left upper limb: Secondary | ICD-10-CM

## 2024-02-26 NOTE — Progress Notes (Signed)
 Post-Op Visit Note   Patient: Erica Lang           Date of Birth: 1976-01-05           MRN: [id number] Visit Date: 02/26/2024 PCP: Georganna Skeans, MD   Assessment & Plan:  Chief Complaint:  Chief Complaint  Patient presents with   Left Wrist - Follow-up    Left carpal tunnel release 02/11/2024   Visit Diagnoses:  1. Left carpal tunnel syndrome   2. S/P carpal tunnel release     Plan: Patient is 2 weeks postop from a left carpal tunnel release.  Comes in for suture removal.  Examination of left hand shows moderate swelling around the wrist and the base of the palm.  No neurovascular compromise.  She has discomfort with making a fist.  She has a fair amount of residual swelling.  This is causing the majority of her symptoms.  I recommend ice and elevation and activity modification as needed.  Sutures removed Steri-Strips applied.  Recheck in 2 weeks to determine work status.  Follow-Up Instructions: Return in about 2 weeks (around 03/11/2024) for with lindsey.   Orders:  No orders of the defined types were placed in this encounter.  No orders of the defined types were placed in this encounter.   Imaging: No results found.  PMFS History: Patient Active Problem List   Diagnosis Date Noted   Left carpal tunnel syndrome 12/18/2023   Obesity due to excess calories with serious comorbidity 11/29/2019   Generalized anxiety disorder 11/29/2019   Mild intermittent asthma without complication 11/29/2019   Normochromic normocytic anemia 11/29/2019   Pyelonephritis 05/29/2016   Sepsis (HCC) 05/29/2016   Acute UTI 05/29/2016   Headache    Chest pain 11/01/2013   Anemia    Anxiety    Hypertension    Hidradenitis    Asthma    Female infertility    Hx of varicella    Persistent headaches 10/30/2013   Left sided numbness 10/29/2013   Postpartum hypertension 10/19/2013   Postpartum edema 10/19/2013   Dysmenorrhea 10/28/2007   DYSFUNCTIONAL UTERINE BLEEDING 10/28/2007    POLYCYSTIC OVARIAN DISEASE 05/03/1999   PCOS (polycystic ovarian syndrome) 05/03/1999   Past Medical History:  Diagnosis Date   Anemia    Anxiety    Bilateral carpal tunnel syndrome    left only per patient   Headache    History of abnormal cervical Pap smear    History of exercise stress test dr Eden Emms   11-11-2013--  normal/  good exercise capacity, no chest pain, no ischemia   History of pre-eclampsia 11/ 2014   hypertension and postpartum pulmonary edema--  resolved   IBS (irritable bowel syndrome)    Mild asthma    PCOS (polycystic ovarian syndrome)     Family History  Problem Relation Age of Onset   Stroke Mother    Hypertension Mother    Heart disease Mother    Deep vein thrombosis Mother        both legs, arms and back, 2 blood clots migrted to her chest area   Diabetes Mother     Past Surgical History:  Procedure Laterality Date   CARPAL TUNNEL RELEASE Right 08/02/2015   Procedure: RIGHT HAND CARPAL TUNNEL RELEASE;  Surgeon: Bradly Bienenstock, MD;  Location: [facility];  Service: Orthopedics;  Laterality: Right;   CHOLECYSTECTOMY N/A 10/27/2014   Procedure: LAPAROSCOPIC CHOLECYSTECTOMY WITH INTRAOPERATIVE CHOLANGIOGRAM;  Surgeon: Atilano Ina, MD;  Location: [facility];  Service: General;  Laterality: N/A;   dx laparoscopy     I & D  LEFT VULVAR HEMATOMA, POSTPARTUM  07-05-2009   ROBOTIC ASSISTED TOTAL HYSTERECTOMY WITH SALPINGECTOMY Bilateral 06/18/2017   Procedure: ROBOTIC ASSISTED TOTAL HYSTERECTOMY WITH SALPINGECTOMY;  Surgeon: Olivia Mackie, MD;  Location: [facility];  Service: Gynecology;  Laterality: Bilateral;   TRANSTHORACIC ECHOCARDIOGRAM  10-28-2013   normal LVF,  ef 60-65%,  mild AR , MR and , TR   Social History   Occupational History    Employer: [employer]    Comment: [employer]  Tobacco Use   Smoking status: Never    Passive exposure: Never   Smokeless tobacco: Never  Vaping Use   Vaping status: Never Used   Substance and Sexual Activity   Alcohol use: Yes    Alcohol/week: 0.0 standard drinks of alcohol    Comment: occasional   Drug use: No   Sexual activity: Yes    Birth control/protection: Injection    Comment: depo

## 2024-03-07 ENCOUNTER — Encounter: Payer: Self-pay | Admitting: Family Medicine

## 2024-03-07 ENCOUNTER — Ambulatory Visit: Payer: BC Managed Care – PPO | Admitting: Family Medicine

## 2024-03-07 VITALS — BP 124/87 | HR 96 | Temp 99.0°F | Resp 16 | Ht 69.5 in | Wt 189.6 lb

## 2024-03-07 DIAGNOSIS — Z1211 Encounter for screening for malignant neoplasm of colon: Secondary | ICD-10-CM | POA: Diagnosis not present

## 2024-03-07 DIAGNOSIS — E559 Vitamin D deficiency, unspecified: Secondary | ICD-10-CM

## 2024-03-08 LAB — VITAMIN D 25 HYDROXY (VIT D DEFICIENCY, FRACTURES): Vit D, 25-Hydroxy: 32.5 ng/mL (ref 30.0–100.0)

## 2024-03-09 ENCOUNTER — Encounter: Payer: Self-pay | Admitting: Family Medicine

## 2024-03-09 NOTE — Progress Notes (Signed)
 Established Patient Office Visit  Subjective    Patient ID: Erica Lang, female    DOB: 16-Oct-1976  Age: 48 y.o. MRN: [id number]  CC:  Chief Complaint  Patient presents with   Follow-up    3 month    HPI Erica Lang presents for follow up of vitamin d deficiency. Patient denies acute complaints.   Outpatient Encounter Medications as of 03/07/2024  Medication Sig   albuterol (PROVENTIL HFA;VENTOLIN HFA) 108 (90 BASE) MCG/ACT inhaler Inhale 2 puffs into the lungs every 6 (six) hours as needed for wheezing.   Biotin 57846 MCG TBDP Take 10,000 mcg by mouth daily.   Calcium Carb-Cholecalciferol (CALCIUM 600 + D PO) Take 1 tablet by mouth daily.   calcium carbonate (TUMS - DOSED IN MG ELEMENTAL CALCIUM) 500 MG chewable tablet Chew 1 tablet by mouth daily as needed for indigestion or heartburn.   cetirizine-pseudoephedrine (ZYRTEC-D) 5-120 MG tablet Take 1 tablet by mouth 2 (two) times daily.   EPINEPHrine (EPI-PEN) 0.3 mg/0.3 mL DEVI Inject 0.3 mLs (0.3 mg total) into the muscle once.   ferrous sulfate 325 (65 FE) MG EC tablet Take 325 mg by mouth at bedtime.   gabapentin (NEURONTIN) 100 MG capsule Take 2-3 capsules (200-300 mg total) by mouth at bedtime as needed.   Melatonin 10 MG TABS Take 10 mg by mouth at bedtime as needed (sleep).   Potassium 99 MG TABS Take 99 mg by mouth daily.   Vitamin D, Ergocalciferol, (DRISDOL) 1.25 MG (50000 UNIT) CAPS capsule Take 1 capsule (50,000 Units total) by mouth every 7 (seven) days.   acetaminophen-codeine (TYLENOL #3) 300-30 MG tablet Take 1-2 tablets by mouth 2 (two) times daily as needed. (Patient not taking: Reported on 03/07/2024)   HYDROcodone-acetaminophen (NORCO) 5-325 MG tablet Take 1 tablet by mouth 3 (three) times daily as needed. To be taken after surgery (Patient not taking: Reported on 03/07/2024)   ondansetron (ZOFRAN) 4 MG tablet Take 1 tablet (4 mg total) by mouth every 8 (eight) hours as needed for nausea or vomiting. (Patient  not taking: Reported on 03/07/2024)   No facility-administered encounter medications on file as of 03/07/2024.    Past Medical History:  Diagnosis Date   Anemia    Anxiety    Bilateral carpal tunnel syndrome    left only per patient   Headache    History of abnormal cervical Pap smear    History of exercise stress test dr Eden Emms   11-11-2013--  normal/  good exercise capacity, no chest pain, no ischemia   History of pre-eclampsia 11/ 2014   hypertension and postpartum pulmonary edema--  resolved   IBS (irritable bowel syndrome)    Mild asthma    PCOS (polycystic ovarian syndrome)     Past Surgical History:  Procedure Laterality Date   CARPAL TUNNEL RELEASE Right 08/02/2015   Procedure: RIGHT HAND CARPAL TUNNEL RELEASE;  Surgeon: Bradly Bienenstock, MD;  Location: [facility] ;  Service: Orthopedics;  Laterality: Right;   CHOLECYSTECTOMY N/A 10/27/2014   Procedure: LAPAROSCOPIC CHOLECYSTECTOMY WITH INTRAOPERATIVE CHOLANGIOGRAM;  Surgeon: Atilano Ina, MD;  Location: [facility];  Service: General;  Laterality: N/A;   dx laparoscopy     I & D  LEFT VULVAR HEMATOMA, POSTPARTUM  07-05-2009   ROBOTIC ASSISTED TOTAL HYSTERECTOMY WITH SALPINGECTOMY Bilateral 06/18/2017   Procedure: ROBOTIC ASSISTED TOTAL HYSTERECTOMY WITH SALPINGECTOMY;  Surgeon: Olivia Mackie, MD;  Location: [facility];  Service: Gynecology;  Laterality: Bilateral;   TRANSTHORACIC ECHOCARDIOGRAM  10-28-2013   normal LVF,  ef 60-65%,  mild AR , MR and , TR    Family History  Problem Relation Age of Onset   Stroke Mother    Hypertension Mother    Heart disease Mother    Deep vein thrombosis Mother        both legs, arms and back, 2 blood clots migrted to her chest area   Diabetes Mother     Social History   Socioeconomic History   Marital status: Married    Spouse name: Not on file   Number of children: 2   Years of education: Not on file   Highest education level: Associate degree: occupational, Scientist, product/process development,  or vocational program  Occupational History    Employer: [employer]    Comment: [employer]  Tobacco Use   Smoking status: Never    Passive exposure: Never   Smokeless tobacco: Never  Vaping Use   Vaping status: Never Used  Substance and Sexual Activity   Alcohol use: Yes    Alcohol/week: 0.0 standard drinks of alcohol    Comment: occasional   Drug use: No   Sexual activity: Yes    Birth control/protection: Injection    Comment: depo  Other Topics Concern   Not on file  Social History Narrative   Not on file   Social Drivers of Health   Financial Resource Strain: Low Risk  (03/03/2024)   Overall Financial Resource Strain (CARDIA)    Difficulty of Paying Living Expenses: Not very hard  Food Insecurity: Patient Declined (03/03/2024)   Hunger Vital Sign    Worried About Running Out of Food in the Last Year: Patient declined    Ran Out of Food in the Last Year: Patient declined  Transportation Needs: No Transportation Needs (03/03/2024)   PRAPARE - Administrator, Civil Service (Medical): No    Lack of Transportation (Non-Medical): No  Physical Activity: Inactive (03/03/2024)   Exercise Vital Sign    Days of Exercise per Week: 0 days    Minutes of Exercise per Session: 30 min  Stress: No Stress Concern Present (03/03/2024)   [employer] of Occupational Health - Occupational Stress Questionnaire    Feeling of Stress : Not at all  Social Connections: Moderately Integrated (03/03/2024)   Social Connection and Isolation Panel [NHANES]    Frequency of Communication with Friends and Family: More than three times a week    Frequency of Social Gatherings with Friends and Family: Patient declined    Attends Religious Services: More than 4 times per year    Active Member of [employer] or Organizations: Patient declined    Attends Banker Meetings: Never    Marital Status: Married  Catering manager Violence: Not At Risk (12/08/2023)    Humiliation, Afraid, Rape, and Kick questionnaire    Fear of Current or Ex-Partner: No    Emotionally Abused: No    Physically Abused: No    Sexually Abused: No    Review of Systems  All other systems reviewed and are negative.       Objective    BP 124/87   Pulse 96   Temp 99 F (37.2 C) (Oral)   Resp 16   Ht 5' 9.5" (1.765 m)   Wt 189 lb 9.6 oz (86 kg)   LMP 06/05/2017   SpO2 98%   BMI 27.60 kg/m   Physical Exam Vitals and nursing note reviewed.  Constitutional:  General: She is not in acute distress. Cardiovascular:     Rate and Rhythm: Normal rate and regular rhythm.  Pulmonary:     Effort: Pulmonary effort is normal.     Breath sounds: Normal breath sounds.  Neurological:     General: No focal deficit present.     Mental Status: She is alert and oriented to person, place, and time.         Assessment & Plan:   Vitamin D deficiency -     VITAMIN D 25 Hydroxy (Vit-D Deficiency, Fractures)  Screening for colon cancer -     Cologuard     Return in about 6 months (around 09/07/2024) for physical.   Tommie Raymond, MD

## 2024-03-11 ENCOUNTER — Ambulatory Visit: Payer: BC Managed Care – PPO | Admitting: Physician Assistant

## 2024-03-11 ENCOUNTER — Encounter: Payer: Self-pay | Admitting: Physician Assistant

## 2024-03-11 DIAGNOSIS — Z9889 Other specified postprocedural states: Secondary | ICD-10-CM

## 2024-03-11 NOTE — Progress Notes (Signed)
 Post-Op Visit Note   Patient: Erica Lang           Date of Birth: 02/26/1976           MRN: [id number] Visit Date: 03/11/2024 PCP: Georganna Skeans, MD   Assessment & Plan:  Chief Complaint:  Chief Complaint  Patient presents with   Left Wrist - Follow-up    Left carpal tunnel release 02/11/2024   Visit Diagnoses:  1. S/P carpal tunnel release     Plan: Patient is a pleasant 48 year old female who comes in today 4 weeks status post left carpal tunnel release.  She has been doing much better.  She has some peri-incisional soreness but no other complaints of pain.  She notes that the paresthesias resolved about a week ago.  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  She has to lift upwards of 50 pounds.  She does not feel she is ready to do this yet.  Examination of the left wrist reveals a fully healed surgical scar without complication.  Fingers are warm well-perfused.  She is neurovascularly intact distally.  At this point, she will continue to advance with activity as tolerated.  Have agreed to write her out of work for another 2 weeks.  She will follow-up with [name] as needed.  Call with concerns or questions.  Follow-Up Instructions: Return if symptoms worsen or fail to improve.   Orders:  No orders of the defined types were placed in this encounter.  No orders of the defined types were placed in this encounter.   Imaging: No new imaging  PMFS History: Patient Active Problem List   Diagnosis Date Noted   Left carpal tunnel syndrome 12/18/2023   Obesity due to excess calories with serious comorbidity 11/29/2019   Generalized anxiety disorder 11/29/2019   Mild intermittent asthma without complication 11/29/2019   Normochromic normocytic anemia 11/29/2019   Pyelonephritis 05/29/2016   Sepsis (HCC) 05/29/2016   Acute UTI 05/29/2016   Headache    Chest pain 11/01/2013   Anemia     Anxiety    Hypertension    Hidradenitis    Asthma    Female infertility    Hx of varicella    Persistent headaches 10/30/2013   Left sided numbness 10/29/2013   Postpartum hypertension 10/19/2013   Postpartum edema 10/19/2013   Dysmenorrhea 10/28/2007   DYSFUNCTIONAL UTERINE BLEEDING 10/28/2007   POLYCYSTIC OVARIAN DISEASE 05/03/1999   PCOS (polycystic ovarian syndrome) 05/03/1999   Past Medical History:  Diagnosis Date   Anemia    Anxiety    Bilateral carpal tunnel syndrome    left only per patient   Headache    History of abnormal cervical Pap smear    History of exercise stress test dr Eden Emms   11-11-2013--  normal/  good exercise capacity, no chest pain, no ischemia   History of pre-eclampsia 11/ 2014   hypertension and postpartum pulmonary edema--  resolved   IBS (irritable bowel syndrome)    Mild asthma    PCOS (polycystic ovarian syndrome)     Family History  Problem Relation Age of Onset   Stroke Mother    Hypertension Mother    Heart disease Mother    Deep vein thrombosis Mother        both legs, arms and back, 2 blood clots migrted to her chest area   Diabetes Mother  Past Surgical History:  Procedure Laterality Date   CARPAL TUNNEL RELEASE Right 08/02/2015   Procedure: RIGHT HAND CARPAL TUNNEL RELEASE;  Surgeon: Bradly Bienenstock, MD;  Location: [facility];  Service: Orthopedics;  Laterality: Right;   CHOLECYSTECTOMY N/A 10/27/2014   Procedure: LAPAROSCOPIC CHOLECYSTECTOMY WITH INTRAOPERATIVE CHOLANGIOGRAM;  Surgeon: Atilano Ina, MD;  Location: [facility];  Service: General;  Laterality: N/A;   dx laparoscopy     I & D  LEFT VULVAR HEMATOMA, POSTPARTUM  07-05-2009   ROBOTIC ASSISTED TOTAL HYSTERECTOMY WITH SALPINGECTOMY Bilateral 06/18/2017   Procedure: ROBOTIC ASSISTED TOTAL HYSTERECTOMY WITH SALPINGECTOMY;  Surgeon: Olivia Mackie, MD;  Location: [facility];  Service: Gynecology;  Laterality: Bilateral;   TRANSTHORACIC ECHOCARDIOGRAM  10-28-2013    normal LVF,  ef 60-65%,  mild AR , MR and , TR   Social History   Occupational History    Employer: [employer]    Comment: [employer]  Tobacco Use   Smoking status: Never    Passive exposure: Never   Smokeless tobacco: Never  Vaping Use   Vaping status: Never Used  Substance and Sexual Activity   Alcohol use: Yes    Alcohol/week: 0.0 standard drinks of alcohol    Comment: occasional   Drug use: No   Sexual activity: Yes    Birth control/protection: Injection    Comment: depo

## 2024-03-25 ENCOUNTER — Ambulatory Visit: Admitting: Physician Assistant

## 2024-03-25 ENCOUNTER — Encounter: Payer: Self-pay | Admitting: Physician Assistant

## 2024-03-25 DIAGNOSIS — G5602 Carpal tunnel syndrome, left upper limb: Secondary | ICD-10-CM

## 2024-03-25 DIAGNOSIS — Z9889 Other specified postprocedural states: Secondary | ICD-10-CM

## 2024-03-25 NOTE — Progress Notes (Signed)
 Post-Op Visit Note   Patient: Erica Lang           Date of Birth: 07/23/1976           MRN: [id number] Visit Date: 03/25/2024 PCP: Georganna Skeans, MD   Assessment & Plan:  Chief Complaint:  Chief Complaint  Patient presents with   Left Wrist - Follow-up    Left carpal tunnel release 02/11/2024   Visit Diagnoses:  1. Left carpal tunnel syndrome   2. S/P carpal tunnel release     Plan: Patient is a pleasant 48 year old female who comes in today approximately 6 weeks status post left carpal tunnel release 02/11/2024.  This was for moderate compression of median nerve.  She no longer has paresthesias but continues to have a fair amount of tenderness around the incision.  She has been using a ball to work on and exercises and strengthening.  She is still unable to lift anything heavy secondary to the pain.  She has been taking ibuprofen and Tylenol.  Examination of the left hand reveals a fully healed surgical scar.  She does have a fair amount of scar tissue.  She is very tender over this area.  She has increased pain with flexion and extension.  Fingers are warm well-perfused.  She is neurovascular intact distally.  At this point, recommended scar desensitization with lotion.  Of also recommended a short course of occupational therapy.  Referrals been made.  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  She will follow-up with Dr. Deno Etienne in 3 weeks for recheck.  Call with concerns or questions.  Follow-Up Instructions: Return in about 2 weeks (around 04/08/2024) for with xu.   Orders:  Orders Placed This Encounter  Procedures   Ambulatory referral to Occupational Therapy   No orders of the defined types were placed in this encounter.   Imaging: No new imaging  PMFS History: Patient Active Problem List    Diagnosis Date Noted   Left carpal tunnel syndrome 12/18/2023   Obesity due to excess calories with serious comorbidity 11/29/2019   Generalized anxiety disorder 11/29/2019   Mild intermittent asthma without complication 11/29/2019   Normochromic normocytic anemia 11/29/2019   Pyelonephritis 05/29/2016   Sepsis (HCC) 05/29/2016   Acute UTI 05/29/2016   Headache    Chest pain 11/01/2013   Anemia    Anxiety    Hypertension    Hidradenitis    Asthma    Female infertility    Hx of varicella    Persistent headaches 10/30/2013   Left sided numbness 10/29/2013   Postpartum hypertension 10/19/2013   Postpartum edema 10/19/2013   Dysmenorrhea 10/28/2007   DYSFUNCTIONAL UTERINE BLEEDING 10/28/2007   POLYCYSTIC OVARIAN DISEASE 05/03/1999   PCOS (polycystic ovarian syndrome) 05/03/1999   Past Medical History:  Diagnosis Date   Anemia    Anxiety    Bilateral carpal tunnel syndrome    left only per patient   Headache    History of abnormal cervical Pap smear    History of exercise stress test dr Eden Emms   11-11-2013--  normal/  good exercise capacity, no chest pain, no ischemia   History of pre-eclampsia 11/ 2014   hypertension and postpartum pulmonary edema--  resolved   IBS (irritable bowel syndrome)  Mild asthma    PCOS (polycystic ovarian syndrome)     Family History  Problem Relation Age of Onset   Stroke Mother    Hypertension Mother    Heart disease Mother    Deep vein thrombosis Mother        both legs, arms and back, 2 blood clots migrted to her chest area   Diabetes Mother     Past Surgical History:  Procedure Laterality Date   CARPAL TUNNEL RELEASE Right 08/02/2015   Procedure: RIGHT HAND CARPAL TUNNEL RELEASE;  Surgeon: Bradly Bienenstock, MD;  Location: [facility];  Service: Orthopedics;  Laterality: Right;   CHOLECYSTECTOMY N/A 10/27/2014   Procedure: LAPAROSCOPIC CHOLECYSTECTOMY WITH INTRAOPERATIVE CHOLANGIOGRAM;  Surgeon: Atilano Ina, MD;   Location: [facility];  Service: General;  Laterality: N/A;   dx laparoscopy     I & D  LEFT VULVAR HEMATOMA, POSTPARTUM  07-05-2009   ROBOTIC ASSISTED TOTAL HYSTERECTOMY WITH SALPINGECTOMY Bilateral 06/18/2017   Procedure: ROBOTIC ASSISTED TOTAL HYSTERECTOMY WITH SALPINGECTOMY;  Surgeon: Olivia Mackie, MD;  Location: [facility];  Service: Gynecology;  Laterality: Bilateral;   TRANSTHORACIC ECHOCARDIOGRAM  10-28-2013   normal LVF,  ef 60-65%,  mild AR , MR and , TR   Social History   Occupational History    Employer: [employer]    Comment: [employer]  Tobacco Use   Smoking status: Never    Passive exposure: Never   Smokeless tobacco: Never  Vaping Use   Vaping status: Never Used  Substance and Sexual Activity   Alcohol use: Yes    Alcohol/week: 0.0 standard drinks of alcohol    Comment: occasional   Drug use: No   Sexual activity: Yes    Birth control/protection: Injection    Comment: depo

## 2024-04-04 NOTE — Therapy (Signed)
 OUTPATIENT OCCUPATIONAL THERAPY ORTHO EVALUATION  Patient Name: Erica Lang MRN: [id number] DOB:[birth year], 48 y.o., female Today's Date: 04/05/2024  PCP: Gabriel Earing MD REFERRING PROVIDER:  Cristie Hem, PA-C    END OF SESSION:  OT End of Session - 04/05/24 1102     Visit Number 1    Number of Visits 9    Date for OT Re-Evaluation 05/06/24    Authorization Type BCBS    OT Start Time 1102    OT Stop Time 1138    OT Time Calculation (min) 36 min    Activity Tolerance Patient tolerated treatment well;No increased pain;Patient limited by fatigue;Patient limited by pain    Behavior During Therapy [facility] for tasks assessed/performed             Past Medical History:  Diagnosis Date   Anemia    Anxiety    Bilateral carpal tunnel syndrome    left only per patient   Headache    History of abnormal cervical Pap smear    History of exercise stress test dr Eden Emms   11-11-2013--  normal/  good exercise capacity, no chest pain, no ischemia   History of pre-eclampsia 11/ 2014   hypertension and postpartum pulmonary edema--  resolved   IBS (irritable bowel syndrome)    Mild asthma    PCOS (polycystic ovarian syndrome)    Past Surgical History:  Procedure Laterality Date   CARPAL TUNNEL RELEASE Right 08/02/2015   Procedure: RIGHT HAND CARPAL TUNNEL RELEASE;  Surgeon: Bradly Bienenstock, MD;  Location: [facility];  Service: Orthopedics;  Laterality: Right;   CHOLECYSTECTOMY N/A 10/27/2014   Procedure: LAPAROSCOPIC CHOLECYSTECTOMY WITH INTRAOPERATIVE CHOLANGIOGRAM;  Surgeon: Atilano Ina, MD;  Location: [facility];  Service: General;  Laterality: N/A;   dx laparoscopy     I & D  LEFT VULVAR HEMATOMA, POSTPARTUM  07-05-2009   ROBOTIC ASSISTED TOTAL HYSTERECTOMY WITH SALPINGECTOMY Bilateral 06/18/2017   Procedure: ROBOTIC ASSISTED TOTAL HYSTERECTOMY WITH SALPINGECTOMY;  Surgeon: Olivia Mackie, MD;  Location: [facility];  Service: Gynecology;  Laterality: Bilateral;    TRANSTHORACIC ECHOCARDIOGRAM  10-28-2013   normal LVF,  ef 60-65%,  mild AR , MR and , TR   Patient Active Problem List   Diagnosis Date Noted   Left carpal tunnel syndrome 12/18/2023   Obesity due to excess calories with serious comorbidity 11/29/2019   Generalized anxiety disorder 11/29/2019   Mild intermittent asthma without complication 11/29/2019   Normochromic normocytic anemia 11/29/2019   Pyelonephritis 05/29/2016   Sepsis (HCC) 05/29/2016   Acute UTI 05/29/2016   Headache    Chest pain 11/01/2013   Anemia    Anxiety    Hypertension    Hidradenitis    Asthma    Female infertility    Hx of varicella    Persistent headaches 10/30/2013   Left sided numbness 10/29/2013   Postpartum hypertension 10/19/2013   Postpartum edema 10/19/2013   Dysmenorrhea 10/28/2007   DYSFUNCTIONAL UTERINE BLEEDING 10/28/2007   POLYCYSTIC OVARIAN DISEASE 05/03/1999   PCOS (polycystic ovarian syndrome) 05/03/1999    ONSET DATE: DOS 02/11/24  REFERRING DIAG: G56.02 (ICD-10-CM) - Left carpal tunnel syndrome   THERAPY DIAG:  Muscle weakness (generalized)  Pain in left wrist  Paresthesia of skin  Rationale for Evaluation and Treatment: Rehabilitation  SUBJECTIVE:   SUBJECTIVE STATEMENT: ~8 weeks s/p Lt CTR.  She states her sx was successful and numbness is gone but pain is pulling, throbing and sharp around sx site and into  distal forearm.  It looks red and somewhat swollen today.    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    PERTINENT HISTORY:  Rt CTR in 2016   PRECAUTIONS: None  RED FLAGS: None   WEIGHT BEARING RESTRICTIONS: No  PAIN:  Are you having pain? Yes: NPRS scale: 6/10 at rest and up to 8/10 with motion and activities  Pain location: Left wrist and surgical area Pain description: Burning and tight Aggravating factors: Weightbearing and activities Relieving factors: Rest  FALLS: Has patient fallen in last 6 months? No   PLOF: Independent  PATIENT  GOALS: To improve the use of left dominant hand and arm  NEXT MD VISIT: 04/19/2024   OBJECTIVE: (All objective assessments below are from initial evaluation on: 04/04/24 unless otherwise specified.)   HAND DOMINANCE: LEFT  ADLs: Overall ADLs: States decreased ability to grab, hold household objects, pain and difficulty to open containers, perform FMS tasks (manipulate fasteners on clothing), mild to moderate bathing problems as well.    FUNCTIONAL OUTCOME MEASURES: Eval: Patient Specific Functional Scale: 3 (lift items, move wrist, open jar)  (Higher Score  =  Better Ability for the Selected Tasks)      UPPER EXTREMITY ROM     Shoulder to Wrist AROM Left eval  Shoulder flexion   Shoulder abduction   Shoulder extension   Shoulder internal rotation   Shoulder external rotation   Elbow flexion   Elbow extension   Forearm supination 80-90  Forearm pronation  90  Wrist flexion 31  Wrist extension 55  Wrist ulnar deviation   Wrist radial deviation   Functional dart thrower's motion (F-DTM) in ulnar flexion   F-DTM in radial extension    (Blank rows = not tested)   Hand AROM Left eval  Full Fist Ability (or Gap to Distal Palmar Crease) Full but pain, pulling  Thumb Opposition  (Kapandji Scale)  5/10  (Blank rows = not tested)   UPPER EXTREMITY MMT:     MMT Left 04/05/24  Shoulder flexion   Shoulder abduction   Shoulder adduction   Shoulder extension   Shoulder internal rotation   Shoulder external rotation   Middle trapezius   Lower trapezius   Elbow flexion   Elbow extension   Forearm supination   Forearm pronation   Wrist flexion 3-/5  Wrist extension 3-/5  Wrist ulnar deviation   Wrist radial deviation   (Blank rows = not tested)  HAND FUNCTION: Eval: Observed weakness in affected Lt hand.  Grip strength Right: 61 lbs, Left: 22 lbs   COORDINATION: Eval: Observed coordination impairments with affected left hand as seen by inhibitory pain and stiffness.   Details will be tested as needed 9 Hole Peg Test Left: TBD sec (TBD sec is WFL)   SENSATION: Eval:  Light touch intact today, though a bit hypersensitive around the surgical area and distal volar wrist  EDEMA:   Eval:  Mildly swollen in palm of left hand and distal volar wrist area  COGNITION: Eval: Overall cognitive status: WFL for evaluation today   OBSERVATIONS:   Eval: Some redness and swelling around the carpal tunnel release as well as the distal volar wrist area.  She is a bit hypersensitive to touch, has some difficulty gliding the tendons through her carpal tunnel with some pain and also some fear and apprehension.  L CTR    TODAY'S TREATMENT:  Post-evaluation treatment:   For safety/self-care, OT recommends no painful activities, pushing or pulling heavy  objects or anything that is painful for the next 2 weeks.  OT educates on anatomy and nervous anatomy and describes that soft tissues do not react well to being injured or forced or hurt.  OT educates on gentle range of motion, nerve gliding that should be done as listed below 4-6 times a day as tolerated and especially done in a nonpainful fashion.  She was also educated on systematic desensitization as tolerated lightly brushing and touching around the scar area 4-6 times a day for 2 to 3 minutes at a time.  She tolerates all this well today, states understanding and will attempt to perform at home for HEP.   Exercises - Tendon Glides  - 4 x daily - 5 reps - 3 second hold - Turn Palm Facing Up & Down  - 4 x daily - 15 reps - Bend and Pull Back Wrist SLOWLY  - 4 x daily - 15 reps - Wrist Prayer Stretch  - 4 x daily - 3 reps - 15 second hold - Median Nerve Flossing  - 3 x daily - 5 reps Patient Education - Scar Massage    PATIENT EDUCATION: Education details: See tx section above for details  Person educated: Patient Education method: Verbal Instruction, Teach back, Handouts  Education comprehension: States and  demonstrates understanding, Additional Education required    HOME EXERCISE PROGRAM: Access Code: [email or web address] URL: [email or web address] Date: 04/05/2024 Prepared by: Fannie Knee   GOALS: Goals reviewed with patient? Yes   SHORT TERM GOALS: (STG required if POC>30 days) Target Date: 04/15/2024  Pt will demo/state understanding of initial HEP to improve pain levels and prerequisite motion. Goal status: INITIAL   LONG TERM GOALS: Target Date: 05/06/24  Pt will improve functional ability by decreased impairment per PSFS assessment from 3 to 7 or better, for better quality of life. Goal status: INITIAL  2.  Pt will improve grip strength in left hand from 20 to lbs to at least 40 lbs for functional use at home and in IADLs. Goal status: INITIAL  3.  Pt will improve A/ROM in left wrist flexion/extension from 31/55 to at least 60 degrees each, to have functional motion for tasks like reach and grasp.  Goal status: INITIAL  4.  Pt will improve strength in left wrist flexion/extension from apparent 3 -/5 MMT to at least 4+/5 MMT to have increased functional ability to carry out selfcare and higher-level homecare tasks with less difficulty. Goal status: INITIAL  5.  Pt will improve coordination skills in left hand and arm, as seen by within functional limit score on nine-hole peg testing to have increased functional ability to carry out fine motor tasks (fasteners, etc.) and more complex, coordinated IADLs (meal prep, sports, etc.).  Goal status: INITIAL  6.  Pt will decrease pain at worst from 7-8/10 to 2-3/10 or better to have better sleep and occupational participation in daily roles. Goal status: INITIAL   ASSESSMENT:  CLINICAL IMPRESSION: Patient is a 48 y.o. female who was seen today for occupational therapy evaluation for complications after left carpal tunnel release including hypersensitivity, pain, stiffness, weakness and decreased functional ability.  She  will benefit from outpatient occupational therapy to decrease symptoms and increase quality of life.   PERFORMANCE DEFICITS: in functional skills including IADLs, coordination, dexterity, sensation, ROM, strength, pain, fascial restrictions, flexibility, Fine motor control, body mechanics, endurance, decreased knowledge of precautions, and UE functional use, cognitive skills including problem solving and safety awareness, and psychosocial skills including coping strategies,  environmental adaptation, habits, and routines and behaviors.   IMPAIRMENTS: are limiting patient from IADLs, rest and sleep, work, and leisure.   COMORBIDITIES: may have co-morbidities  that affects occupational performance. Patient will benefit from skilled OT to address above impairments and improve overall function.  MODIFICATION OR ASSISTANCE TO COMPLETE EVALUATION: No modification of tasks or assist necessary to complete an evaluation.  OT OCCUPATIONAL PROFILE AND HISTORY: Problem focused assessment: Including review of records relating to presenting problem.  CLINICAL DECISION MAKING: LOW - limited treatment options, no task modification necessary  REHAB POTENTIAL: Excellent  EVALUATION COMPLEXITY: Low      PLAN:  OT FREQUENCY: 1-2x/week  OT DURATION: 4 weeks through 05/06/24 and up to 9 total visits as needed  PLANNED INTERVENTIONS: 97168 OT Re-evaluation, 97535 self care/ADL training, 96045 therapeutic exercise, 97530 therapeutic activity, 97112 neuromuscular re-education, 97140 manual therapy, 97035 ultrasound, 97039 fluidotherapy, 97010 moist heat, 97010 cryotherapy, 97760 Orthotics management and training, 40981 Subsequent splinting/medication, scar mobilization, passive range of motion, compression bandaging, Dry needling, energy conservation, coping strategies training, and patient/family education  RECOMMENDED OTHER SERVICES: none now   CONSULTED AND AGREED WITH PLAN OF CARE: Patient  PLAN FOR NEXT  SESSION:   Check initial HEP and recommendations    Fannie Knee, OTR/L, CHT 04/05/2024, 1:00 PM

## 2024-04-05 ENCOUNTER — Ambulatory Visit: Admitting: Rehabilitative and Restorative Service Providers"

## 2024-04-05 ENCOUNTER — Encounter: Payer: Self-pay | Admitting: Rehabilitative and Restorative Service Providers"

## 2024-04-05 DIAGNOSIS — R202 Paresthesia of skin: Secondary | ICD-10-CM

## 2024-04-05 DIAGNOSIS — M6281 Muscle weakness (generalized): Secondary | ICD-10-CM

## 2024-04-05 DIAGNOSIS — M25532 Pain in left wrist: Secondary | ICD-10-CM | POA: Diagnosis not present

## 2024-04-11 NOTE — Therapy (Signed)
 OUTPATIENT OCCUPATIONAL THERAPY TREATMENT NOTE   Patient Name: FRONIE HOLSTEIN MRN: [id number] DOB:06/12/1976, 48 y.o., female Today's Date: 04/12/2024  PCP: Delorise Few MD REFERRING PROVIDER:  Sandie Cross, PA-C    END OF SESSION:  OT End of Session - 04/12/24 1150     Visit Number 2    Number of Visits 9    Date for OT Re-Evaluation 05/06/24    Authorization Type BCBS    OT Start Time 1150    OT Stop Time 1228    OT Time Calculation (min) 38 min    Activity Tolerance Patient tolerated treatment well;No increased pain;Patient limited by fatigue;Patient limited by pain    Behavior During Therapy [facility] for tasks assessed/performed              Past Medical History:  Diagnosis Date   Anemia    Anxiety    Bilateral carpal tunnel syndrome    left only per patient   Headache    History of abnormal cervical Pap smear    History of exercise stress test dr Stann Earnest   11-11-2013--  normal/  good exercise capacity, no chest pain, no ischemia   History of pre-eclampsia 11/ 2014   hypertension and postpartum pulmonary edema--  resolved   IBS (irritable bowel syndrome)    Mild asthma    PCOS (polycystic ovarian syndrome)    Past Surgical History:  Procedure Laterality Date   CARPAL TUNNEL RELEASE Right 08/02/2015   Procedure: RIGHT HAND CARPAL TUNNEL RELEASE;  Surgeon: Arvil Birks, MD;  Location: [facility];  Service: Orthopedics;  Laterality: Right;   CHOLECYSTECTOMY N/A 10/27/2014   Procedure: LAPAROSCOPIC CHOLECYSTECTOMY WITH INTRAOPERATIVE CHOLANGIOGRAM;  Surgeon: Fran Imus, MD;  Location: [facility];  Service: General;  Laterality: N/A;   dx laparoscopy     I & D  LEFT VULVAR HEMATOMA, POSTPARTUM  07-05-2009   ROBOTIC ASSISTED TOTAL HYSTERECTOMY WITH SALPINGECTOMY Bilateral 06/18/2017   Procedure: ROBOTIC ASSISTED TOTAL HYSTERECTOMY WITH SALPINGECTOMY;  Surgeon: Meriam Stamp, MD;  Location: [facility];  Service: Gynecology;  Laterality: Bilateral;    TRANSTHORACIC ECHOCARDIOGRAM  10-28-2013   normal LVF,  ef 60-65%,  mild AR , MR and , TR   Patient Active Problem List   Diagnosis Date Noted   Left carpal tunnel syndrome 12/18/2023   Obesity due to excess calories with serious comorbidity 11/29/2019   Generalized anxiety disorder 11/29/2019   Mild intermittent asthma without complication 11/29/2019   Normochromic normocytic anemia 11/29/2019   Pyelonephritis 05/29/2016   Sepsis (HCC) 05/29/2016   Acute UTI 05/29/2016   Headache    Chest pain 11/01/2013   Anemia    Anxiety    Hypertension    Hidradenitis    Asthma    Female infertility    Hx of varicella    Persistent headaches 10/30/2013   Left sided numbness 10/29/2013   Postpartum hypertension 10/19/2013   Postpartum edema 10/19/2013   Dysmenorrhea 10/28/2007   DYSFUNCTIONAL UTERINE BLEEDING 10/28/2007   POLYCYSTIC OVARIAN DISEASE 05/03/1999   PCOS (polycystic ovarian syndrome) 05/03/1999    ONSET DATE: DOS 02/11/24  REFERRING DIAG: G56.02 (ICD-10-CM) - Left carpal tunnel syndrome   THERAPY DIAG:  Muscle weakness (generalized)  Pain in left wrist  Paresthesia of skin  Rationale for Evaluation and Treatment: Rehabilitation  PERTINENT HISTORY:  Rt CTR in 2016  She states her sx was successful and numbness is gone but pain is pulling, throbing and sharp around sx site and into distal  forearm.  It looks red and somewhat swollen today.    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    PRECAUTIONS: None  RED FLAGS: None   WEIGHT BEARING RESTRICTIONS: No      SUBJECTIVE:   SUBJECTIVE STATEMENT: ~8 weeks s/p Lt CTR.  She states feeling a little better than the initial visit.  She states she is doing home exercises and desensitization's as best she can and avoiding pain.   PAIN:  Are you having pain? Yes: NPRS scale: 5/10 at rest and up to 6/10 with motion and activities  Pain location: Left wrist and surgical area Pain description: Burning and  tight Aggravating factors: Weightbearing and activities Relieving factors: Rest   PATIENT GOALS: To improve the use of left dominant hand and arm  NEXT MD VISIT: 04/19/2024   OBJECTIVE: (All objective assessments below are from initial evaluation on: 04/04/24 unless otherwise specified.)   HAND DOMINANCE: LEFT  ADLs: Overall ADLs: States decreased ability to grab, hold household objects, pain and difficulty to open containers, perform FMS tasks (manipulate fasteners on clothing), mild to moderate bathing problems as well.    FUNCTIONAL OUTCOME MEASURES: Eval: Patient Specific Functional Scale: 3 (lift items, move wrist, open jar)  (Higher Score  =  Better Ability for the Selected Tasks)      UPPER EXTREMITY ROM     Shoulder to Wrist AROM Left eval Lt 04/12/24  Shoulder flexion    Shoulder abduction    Shoulder extension    Shoulder internal rotation    Shoulder external rotation    Elbow flexion    Elbow extension    Forearm supination 80-90   Forearm pronation  90   Wrist flexion 31 39  Wrist extension 55 52  Wrist ulnar deviation    Wrist radial deviation    Functional dart thrower's motion (F-DTM) in ulnar flexion    F-DTM in radial extension     (Blank rows = not tested)   Hand AROM Left eval Lt 04/12/24  Full Fist Ability (or Gap to Distal Palmar Crease) Full but pain, pulling Full  Thumb Opposition  (Kapandji Scale)  5/10 8/10  (Blank rows = not tested)   UPPER EXTREMITY MMT:     MMT Left 04/05/24  Shoulder flexion   Shoulder abduction   Shoulder adduction   Shoulder extension   Shoulder internal rotation   Shoulder external rotation   Middle trapezius   Lower trapezius   Elbow flexion   Elbow extension   Forearm supination   Forearm pronation   Wrist flexion 3-/5  Wrist extension 3-/5  Wrist ulnar deviation   Wrist radial deviation   (Blank rows = not tested)  HAND FUNCTION: Eval: Observed weakness in affected Lt hand.  Grip strength  Right: 61 lbs, Left: 22 lbs   COORDINATION: Eval: Observed coordination impairments with affected left hand as seen by inhibitory pain and stiffness.  Details will be tested as needed 9 Hole Peg Test Left: TBD sec (TBD sec is WFL)   SENSATION: Eval:  Light touch intact today, though a bit hypersensitive around the surgical area and distal volar wrist  EDEMA:   Eval:  Mildly swollen in palm of left hand and distal volar wrist area   OBSERVATIONS:   Eval: Some redness and swelling around the carpal tunnel release as well as the distal volar wrist area.  She is a bit hypersensitive to touch, has some difficulty gliding the tendons through her carpal tunnel with  some pain and also some fear and apprehension.  L CTR    TODAY'S TREATMENT:  04/12/24: She starts with active range of motion which shows some improvements in wrist flexion but not wrist extension.  To help with this, OT reviews her home exercises, has her start prayer stretching now for wrist extension.  She is told to do all these things in a nonpainful fashion as before.  OT focuses on pain relief including scar mobilizations with cupping, IASTM myofascial release around the scar area and volar wrist areas.  We then review and performed the rest of her exercise program including nerve gliding which she tolerates fairly well today with some tenderness.  She leaves in no increased pain   Exercises - Tendon Glides  - 4 x daily - 5 reps - 3 second hold - Turn Palm Facing Up & Down  - 4 x daily - 15 reps - Bend and Pull Back Wrist SLOWLY  - 4 x daily - 15 reps - Wrist Prayer Stretch  - 4 x daily - 3 reps - 15 second hold - Median Nerve Flossing  - 3 x daily - 5 reps Patient Education - Scar Massage    PATIENT EDUCATION: Education details: See tx section above for details  Person educated: Patient Education method: Verbal Instruction, Teach back, Handouts  Education comprehension: States and demonstrates understanding, Additional  Education required    HOME EXERCISE PROGRAM: Access Code: [email or web address] URL: [email or web address] Date: 04/05/2024 Prepared by: Fannie Knee   GOALS: Goals reviewed with patient? Yes   SHORT TERM GOALS: (STG required if POC>30 days) Target Date: 04/15/2024  Pt will demo/state understanding of initial HEP to improve pain levels and prerequisite motion. Goal status: INITIAL   LONG TERM GOALS: Target Date: 05/06/24  Pt will improve functional ability by decreased impairment per PSFS assessment from 3 to 7 or better, for better quality of life. Goal status: INITIAL  2.  Pt will improve grip strength in left hand from 20 to lbs to at least 40 lbs for functional use at home and in IADLs. Goal status: INITIAL  3.  Pt will improve A/ROM in left wrist flexion/extension from 31/55 to at least 60 degrees each, to have functional motion for tasks like reach and grasp.  Goal status: INITIAL  4.  Pt will improve strength in left wrist flexion/extension from apparent 3 -/5 MMT to at least 4+/5 MMT to have increased functional ability to carry out selfcare and higher-level homecare tasks with less difficulty. Goal status: INITIAL  5.  Pt will improve coordination skills in left hand and arm, as seen by within functional limit score on nine-hole peg testing to have increased functional ability to carry out fine motor tasks (fasteners, etc.) and more complex, coordinated IADLs (meal prep, sports, etc.).  Goal status: INITIAL  6.  Pt will decrease pain at worst from 7-8/10 to 2-3/10 or better to have better sleep and occupational participation in daily roles. Goal status: INITIAL   ASSESSMENT:  CLINICAL IMPRESSION: 04/12/24: Her volar distal wrist crease is still very swollen and red.  She is having a little less pain, which is a good sign   Eval: Patient is a 48 y.o. female who was seen today for occupational therapy evaluation for complications after left carpal tunnel  release including hypersensitivity, pain, stiffness, weakness and decreased functional ability.  She will benefit from outpatient occupational therapy to decrease symptoms and increase quality of life.       PLAN:  OT FREQUENCY: 1-2x/week  OT DURATION: 4 weeks through 05/06/24 and up to 9 total visits as needed  PLANNED INTERVENTIONS: 97168 OT Re-evaluation, 97535 self care/ADL training, 97110 therapeutic exercise, 97530 therapeutic activity, 97112 neuromuscular re-education, 97140 manual therapy, 97035 ultrasound, 97039 fluidotherapy, 97010 moist heat, 97010 cryotherapy, 97760 Orthotics management and training, [phone] Subsequent splinting/medication, scar mobilization, passive range of motion, compression bandaging, Dry needling, energy conservation, coping strategies training, and patient/family education  RECOMMENDED OTHER SERVICES: none now   CONSULTED AND AGREED WITH PLAN OF CARE: Patient  PLAN FOR NEXT SESSION:   When tolerated, advance to light therapy putty strengthening activities  Mcarthur Ivins Sulema Endo, OTR/L, CHT 04/12/2024, 12:55 PM

## 2024-04-12 ENCOUNTER — Encounter: Payer: Self-pay | Admitting: Rehabilitative and Restorative Service Providers"

## 2024-04-12 ENCOUNTER — Ambulatory Visit: Admitting: Rehabilitative and Restorative Service Providers"

## 2024-04-12 DIAGNOSIS — R202 Paresthesia of skin: Secondary | ICD-10-CM | POA: Diagnosis not present

## 2024-04-12 DIAGNOSIS — M25532 Pain in left wrist: Secondary | ICD-10-CM | POA: Diagnosis not present

## 2024-04-12 DIAGNOSIS — M6281 Muscle weakness (generalized): Secondary | ICD-10-CM

## 2024-04-18 NOTE — Therapy (Signed)
 OUTPATIENT OCCUPATIONAL THERAPY TREATMENT NOTE   Patient Name: Erica Lang MRN: [id number] DOB:April 06, 1976, 48 y.o., female Today's Date: 04/19/2024  PCP: Delorise Few MD REFERRING PROVIDER:  Sandie Cross, PA-C    END OF SESSION:  OT End of Session - 04/19/24 1110     Visit Number 3    Number of Visits 9    Date for OT Re-Evaluation 05/06/24    Authorization Type BCBS    OT Start Time 1111    OT Stop Time 1152    OT Time Calculation (min) 41 min    Activity Tolerance Patient tolerated treatment well;No increased pain;Patient limited by fatigue;Patient limited by pain    Behavior During Therapy [facility] for tasks assessed/performed               Past Medical History:  Diagnosis Date   Anemia    Anxiety    Bilateral carpal tunnel syndrome    left only per patient   Headache    History of abnormal cervical Pap smear    History of exercise stress test dr Stann Earnest   11-11-2013--  normal/  good exercise capacity, no chest pain, no ischemia   History of pre-eclampsia 11/ 2014   hypertension and postpartum pulmonary edema--  resolved   IBS (irritable bowel syndrome)    Mild asthma    PCOS (polycystic ovarian syndrome)    Past Surgical History:  Procedure Laterality Date   CARPAL TUNNEL RELEASE Right 08/02/2015   Procedure: RIGHT HAND CARPAL TUNNEL RELEASE;  Surgeon: Arvil Birks, MD;  Location: [facility];  Service: Orthopedics;  Laterality: Right;   CHOLECYSTECTOMY N/A 10/27/2014   Procedure: LAPAROSCOPIC CHOLECYSTECTOMY WITH INTRAOPERATIVE CHOLANGIOGRAM;  Surgeon: Fran Imus, MD;  Location: [facility];  Service: General;  Laterality: N/A;   dx laparoscopy     I & D  LEFT VULVAR HEMATOMA, POSTPARTUM  07-05-2009   ROBOTIC ASSISTED TOTAL HYSTERECTOMY WITH SALPINGECTOMY Bilateral 06/18/2017   Procedure: ROBOTIC ASSISTED TOTAL HYSTERECTOMY WITH SALPINGECTOMY;  Surgeon: Meriam Stamp, MD;  Location: [facility];  Service: Gynecology;  Laterality: Bilateral;    TRANSTHORACIC ECHOCARDIOGRAM  10-28-2013   normal LVF,  ef 60-65%,  mild AR , MR and , TR   Patient Active Problem List   Diagnosis Date Noted   S/P carpal tunnel release 04/19/2024   Left carpal tunnel syndrome 12/18/2023   Obesity due to excess calories with serious comorbidity 11/29/2019   Generalized anxiety disorder 11/29/2019   Mild intermittent asthma without complication 11/29/2019   Normochromic normocytic anemia 11/29/2019   Pyelonephritis 05/29/2016   Sepsis (HCC) 05/29/2016   Acute UTI 05/29/2016   Headache    Chest pain 11/01/2013   Anemia    Anxiety    Hypertension    Hidradenitis    Asthma    Female infertility    Hx of varicella    Persistent headaches 10/30/2013   Left sided numbness 10/29/2013   Postpartum hypertension 10/19/2013   Postpartum edema 10/19/2013   Dysmenorrhea 10/28/2007   DYSFUNCTIONAL UTERINE BLEEDING 10/28/2007   POLYCYSTIC OVARIAN DISEASE 05/03/1999   PCOS (polycystic ovarian syndrome) 05/03/1999    ONSET DATE: DOS 02/11/24  REFERRING DIAG: G56.02 (ICD-10-CM) - Left carpal tunnel syndrome   THERAPY DIAG:  Muscle weakness (generalized)  Pain in left wrist  Paresthesia of skin  Rationale for Evaluation and Treatment: Rehabilitation  PERTINENT HISTORY:  Rt CTR in 2016  She states her sx was successful and numbness is gone but pain is pulling, throbing  and sharp around sx site and into distal forearm.  It looks red and somewhat swollen today.    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    PRECAUTIONS: None;  RED FLAGS: None   WEIGHT BEARING RESTRICTIONS: No     SUBJECTIVE:   SUBJECTIVE STATEMENT: ~10 weeks s/p Lt CTR.  She states feeling less pain now, but still wishing that she had started the therapy process sooner    PAIN:  Are you having pain? Yes: NPRS scale: 3/10 at rest now Pain location: Left wrist and surgical area Pain description: Burning and tight Aggravating factors: Weightbearing and  activities Relieving factors: Rest   PATIENT GOALS: To improve the use of left dominant hand and arm  NEXT MD VISIT: 04/19/2024   OBJECTIVE: (All objective assessments below are from initial evaluation on: 04/04/24 unless otherwise specified.)   HAND DOMINANCE: LEFT  ADLs: Overall ADLs: States decreased ability to grab, hold household objects, pain and difficulty to open containers, perform FMS tasks (manipulate fasteners on clothing), mild to moderate bathing problems as well.    FUNCTIONAL OUTCOME MEASURES: Eval: Patient Specific Functional Scale: 3 (lift items, move wrist, open jar)  (Higher Score  =  Better Ability for the Selected Tasks)      UPPER EXTREMITY ROM     Shoulder to Wrist AROM Left eval Lt 04/12/24 Lt 04/19/24  Forearm supination 80-90    Forearm pronation  90    Wrist flexion 31 39 51  Wrist extension 55 52 65  Wrist ulnar deviation     Wrist radial deviation     (Blank rows = not tested)   Hand AROM Left eval Lt 04/12/24  Full Fist Ability (or Gap to Distal Palmar Crease) Full but pain, pulling Full  Thumb Opposition  (Kapandji Scale)  5/10 8/10  (Blank rows = not tested)   UPPER EXTREMITY MMT:     MMT Left 04/05/24  Shoulder flexion   Shoulder abduction   Shoulder adduction   Shoulder extension   Shoulder internal rotation   Shoulder external rotation   Middle trapezius   Lower trapezius   Elbow flexion   Elbow extension   Forearm supination   Forearm pronation   Wrist flexion 3-/5  Wrist extension 3-/5  Wrist ulnar deviation   Wrist radial deviation   (Blank rows = not tested)  HAND FUNCTION: 04/19/24:  Grip strength Left: 27#   Eval: Observed weakness in affected Lt hand.  Grip strength Right: 61 lbs, Left: 22 lbs   COORDINATION: Eval: Observed coordination impairments with affected left hand as seen by inhibitory pain and stiffness.  Details will be tested as needed 9 Hole Peg Test Left: TBD sec (TBD sec is WFL)    SENSATION: Eval:  Light touch intact today, though a bit hypersensitive around the surgical area and distal volar wrist  EDEMA:   Eval:  Mildly swollen in palm of left hand and distal volar wrist area   OBSERVATIONS:   Eval: Some redness and swelling around the carpal tunnel release as well as the distal volar wrist area.  She is a bit hypersensitive to touch, has some difficulty gliding the tendons through her carpal tunnel with some pain and also some fear and apprehension.  L CTR    TODAY'S TREATMENT:  04/19/24: She starts with active range of motion for exercise as well as new measures which shows improvement at the wrist and the hand since last visit.  Next, to continue to help  with scar and nerve mobilization, OT does manual therapy IASTM through the palm, wrist and volar forearm as well as cupping over the scar and surgical sites.  She has some pain with these things but overall tolerates it fairly well and pain subsides.  She also uses vibration for desensitization which is relieving.  OT then educates her on new stretches as bolded below as well as a thumb and palm splaying stretch.  Then, OT educates on new therapy putty activities that she can do carefully and without pain to increase the functional use of her hand and functional endurance.  She tolerates these well, states she will be working on these over this week.    Exercises - Tendon Glides  - 4 x daily - 5 reps - 3 second hold - Turn Palm Facing Up & Down  - 4 x daily - 15 reps - Bend and Pull Back Wrist SLOWLY  - 4 x daily - 15 reps - Wrist Prayer Stretch  - 4 x daily - 3 reps - 15 second hold - Median Nerve Flossing  - 3 x daily - 5 reps - Fridge Door Stretch  - 4 x daily - 3-5 reps - 15 sec hold - Full Fist  - 2-3 x daily - 5 reps - Seated Claw Fist with Putty  - 2-3 x daily - 5 reps - Thumb Press  - 2-3 x daily - 5 reps - Thumb Opposition with Putty  - 2-3 x daily - 5 reps    PATIENT EDUCATION: Education  details: See tx section above for details  Person educated: Patient Education method: Verbal Instruction, Teach back, Handouts  Education comprehension: States and demonstrates understanding, Additional Education required    HOME EXERCISE PROGRAM: Access Code: [email or web address] URL: [email or web address] Date: 04/05/2024 Prepared by: Leartis Proud   GOALS: Goals reviewed with patient? Yes   SHORT TERM GOALS: (STG required if POC>30 days) Target Date: 04/15/2024  Pt will demo/state understanding of initial HEP to improve pain levels and prerequisite motion. Goal status: INITIAL   LONG TERM GOALS: Target Date: 05/06/24  Pt will improve functional ability by decreased impairment per PSFS assessment from 3 to 7 or better, for better quality of life. Goal status: INITIAL  2.  Pt will improve grip strength in left hand from 20 to lbs to at least 40 lbs for functional use at home and in IADLs. Goal status: INITIAL  3.  Pt will improve A/ROM in left wrist flexion/extension from 31/55 to at least 60 degrees each, to have functional motion for tasks like reach and grasp.  Goal status: INITIAL  4.  Pt will improve strength in left wrist flexion/extension from apparent 3 -/5 MMT to at least 4+/5 MMT to have increased functional ability to carry out selfcare and higher-level homecare tasks with less difficulty. Goal status: INITIAL  5.  Pt will improve coordination skills in left hand and arm, as seen by within functional limit score on nine-hole peg testing to have increased functional ability to carry out fine motor tasks (fasteners, etc.) and more complex, coordinated IADLs (meal prep, sports, etc.).  Goal status: INITIAL  6.  Pt will decrease pain at worst from 7-8/10 to 2-3/10 or better to have better sleep and occupational participation in daily roles. Goal status: INITIAL   ASSESSMENT:  CLINICAL IMPRESSION: 04/19/24: She is coming along through therapy and improving what  she could do over last week.  She will be leaving the country for vacation in 2  weeks, so we should review safety in the next visit.  She does state that the doctor put her out of work for another 6 weeks, though she would be willing to return sooner if possible.  04/12/24: Her volar distal wrist crease is still very swollen and red.  She is having a little less pain, which is a good sign   Eval: Patient is a 48 y.o. female who was seen today for occupational therapy evaluation for complications after left carpal tunnel release including hypersensitivity, pain, stiffness, weakness and decreased functional ability.  She will benefit from outpatient occupational therapy to decrease symptoms and increase quality of life.       PLAN:  OT FREQUENCY: 1-2x/week  OT DURATION: 4 weeks through 05/06/24 and up to 9 total visits as needed  PLANNED INTERVENTIONS: 97168 OT Re-evaluation, 97535 self care/ADL training, 16109 therapeutic exercise, 97530 therapeutic activity, 97112 neuromuscular re-education, 97140 manual therapy, 97035 ultrasound, 97039 fluidotherapy, 97010 moist heat, 97010 cryotherapy, 97760 Orthotics management and training, 60454 Subsequent splinting/medication, scar mobilization, passive range of motion, compression bandaging, Dry needling, energy conservation, coping strategies training, and patient/family education  RECOMMENDED OTHER SERVICES: none now   CONSULTED AND AGREED WITH PLAN OF CARE: Patient  PLAN FOR NEXT SESSION:   Check new therapy putty activities, check range of motion, strength, ability and continue on progressively.  Discussed safety while she is away on her trip  Leartis Proud, OTR/L, [place] 04/19/2024, 12:50 PM

## 2024-04-19 ENCOUNTER — Ambulatory Visit (INDEPENDENT_AMBULATORY_CARE_PROVIDER_SITE_OTHER): Admitting: Rehabilitative and Restorative Service Providers"

## 2024-04-19 ENCOUNTER — Encounter: Payer: Self-pay | Admitting: Orthopaedic Surgery

## 2024-04-19 ENCOUNTER — Ambulatory Visit (INDEPENDENT_AMBULATORY_CARE_PROVIDER_SITE_OTHER): Admitting: Orthopaedic Surgery

## 2024-04-19 ENCOUNTER — Encounter: Payer: Self-pay | Admitting: Rehabilitative and Restorative Service Providers"

## 2024-04-19 DIAGNOSIS — R202 Paresthesia of skin: Secondary | ICD-10-CM

## 2024-04-19 DIAGNOSIS — G5602 Carpal tunnel syndrome, left upper limb: Secondary | ICD-10-CM

## 2024-04-19 DIAGNOSIS — M6281 Muscle weakness (generalized): Secondary | ICD-10-CM | POA: Diagnosis not present

## 2024-04-19 DIAGNOSIS — M25532 Pain in left wrist: Secondary | ICD-10-CM | POA: Diagnosis not present

## 2024-04-19 DIAGNOSIS — Z9889 Other specified postprocedural states: Secondary | ICD-10-CM | POA: Insufficient documentation

## 2024-04-19 NOTE — Progress Notes (Signed)
 Post-Op Visit Note   Patient: Erica Lang           Date of Birth: January 09, 1976           MRN: [id number] Visit Date: 04/19/2024 PCP: Abraham Abo, MD   Assessment & Plan:  Chief Complaint:  Chief Complaint  Patient presents with   Left Wrist - Follow-up    Left carpal tunnel release 02/11/2024   Visit Diagnoses:  1. S/P carpal tunnel release   2. Left carpal tunnel syndrome     Plan: Erica Lang is 2 months status post left carpal tunnel release.  She is doing therapy once a week.  Feels some tightness around the incision.  Does not feel like she has all of her strength back yet.  Exam shows fully healed surgical scar.  She can oppose her thumb to the fifth metacarpal head.  Scar is still slightly sensitive.  From my standpoint I think she would benefit from another 6 weeks of OT for strengthening.  Will write her out of work until then.  Recheck in 6 weeks.  Follow-Up Instructions: Return in about 6 weeks (around 05/31/2024) for with lindsey.   Orders:  No orders of the defined types were placed in this encounter.  No orders of the defined types were placed in this encounter.   Imaging: No results found.  PMFS History: Patient Active Problem List   Diagnosis Date Noted   S/P carpal tunnel release 04/19/2024   Left carpal tunnel syndrome 12/18/2023   Obesity due to excess calories with serious comorbidity 11/29/2019   Generalized anxiety disorder 11/29/2019   Mild intermittent asthma without complication 11/29/2019   Normochromic normocytic anemia 11/29/2019   Pyelonephritis 05/29/2016   Sepsis (HCC) 05/29/2016   Acute UTI 05/29/2016   Headache    Chest pain 11/01/2013   Anemia    Anxiety    Hypertension    Hidradenitis    Asthma    Female infertility    Hx of varicella    Persistent headaches 10/30/2013   Left sided numbness 10/29/2013   Postpartum hypertension 10/19/2013   Postpartum edema 10/19/2013   Dysmenorrhea 10/28/2007   DYSFUNCTIONAL UTERINE  BLEEDING 10/28/2007   POLYCYSTIC OVARIAN DISEASE 05/03/1999   PCOS (polycystic ovarian syndrome) 05/03/1999   Past Medical History:  Diagnosis Date   Anemia    Anxiety    Bilateral carpal tunnel syndrome    left only per patient   Headache    History of abnormal cervical Pap smear    History of exercise stress test dr Stann Earnest   11-11-2013--  normal/  good exercise capacity, no chest pain, no ischemia   History of pre-eclampsia 11/ 2014   hypertension and postpartum pulmonary edema--  resolved   IBS (irritable bowel syndrome)    Mild asthma    PCOS (polycystic ovarian syndrome)     Family History  Problem Relation Age of Onset   Stroke Mother    Hypertension Mother    Heart disease Mother    Deep vein thrombosis Mother        both legs, arms and back, 2 blood clots migrted to her chest area   Diabetes Mother     Past Surgical History:  Procedure Laterality Date   CARPAL TUNNEL RELEASE Right 08/02/2015   Procedure: RIGHT HAND CARPAL TUNNEL RELEASE;  Surgeon: Arvil Birks, MD;  Location: [facility] [place];  Service: Orthopedics;  Laterality: Right;   CHOLECYSTECTOMY N/A 10/27/2014   Procedure: LAPAROSCOPIC CHOLECYSTECTOMY  WITH INTRAOPERATIVE CHOLANGIOGRAM;  Surgeon: Fran Imus, MD;  Location: [facility];  Service: General;  Laterality: N/A;   dx laparoscopy     I & D  LEFT VULVAR HEMATOMA, POSTPARTUM  07-05-2009   ROBOTIC ASSISTED TOTAL HYSTERECTOMY WITH SALPINGECTOMY Bilateral 06/18/2017   Procedure: ROBOTIC ASSISTED TOTAL HYSTERECTOMY WITH SALPINGECTOMY;  Surgeon: Meriam Stamp, MD;  Location: [facility];  Service: Gynecology;  Laterality: Bilateral;   TRANSTHORACIC ECHOCARDIOGRAM  10-28-2013   normal LVF,  ef 60-65%,  mild AR , MR and , TR   Social History   Occupational History    Employer: [employer]    Comment: [employer]  Tobacco Use   Smoking status: Never    Passive exposure: Never   Smokeless tobacco: Never  Vaping Use   Vaping  status: Never Used  Substance and Sexual Activity   Alcohol use: Yes    Alcohol/week: 0.0 standard drinks of alcohol    Comment: occasional   Drug use: No   Sexual activity: Yes    Birth control/protection: Injection    Comment: depo

## 2024-04-25 NOTE — Therapy (Signed)
 OUTPATIENT OCCUPATIONAL THERAPY TREATMENT NOTE   Patient Name: Erica Lang MRN: [id number] DOB:14-May-1976, 48 y.o., female Today's Date: 04/26/2024  PCP: Delorise Few MD REFERRING PROVIDER:  Sandie Cross, PA-C    END OF SESSION:  OT End of Session - 04/26/24 0957     Visit Number 4    Number of Visits 9    Date for OT Re-Evaluation 05/06/24    Authorization Type BCBS    OT Start Time 1000    OT Stop Time 1041    OT Time Calculation (min) 41 min    Activity Tolerance Patient tolerated treatment well;No increased pain;Patient limited by fatigue;Patient limited by pain    Behavior During Therapy [facility] for tasks assessed/performed                Past Medical History:  Diagnosis Date   Anemia    Anxiety    Bilateral carpal tunnel syndrome    left only per patient   Headache    History of abnormal cervical Pap smear    History of exercise stress test dr Stann Earnest   11-11-2013--  normal/  good exercise capacity, no chest pain, no ischemia   History of pre-eclampsia 11/ 2014   hypertension and postpartum pulmonary edema--  resolved   IBS (irritable bowel syndrome)    Mild asthma    PCOS (polycystic ovarian syndrome)    Past Surgical History:  Procedure Laterality Date   CARPAL TUNNEL RELEASE Right 08/02/2015   Procedure: RIGHT HAND CARPAL TUNNEL RELEASE;  Surgeon: Arvil Birks, MD;  Location: [facility] [place];  Service: Orthopedics;  Laterality: Right;   CHOLECYSTECTOMY N/A 10/27/2014   Procedure: LAPAROSCOPIC CHOLECYSTECTOMY WITH INTRAOPERATIVE CHOLANGIOGRAM;  Surgeon: Fran Imus, MD;  Location: [facility];  Service: General;  Laterality: N/A;   dx laparoscopy     I & D  LEFT VULVAR HEMATOMA, POSTPARTUM  07-05-2009   ROBOTIC ASSISTED TOTAL HYSTERECTOMY WITH SALPINGECTOMY Bilateral 06/18/2017   Procedure: ROBOTIC ASSISTED TOTAL HYSTERECTOMY WITH SALPINGECTOMY;  Surgeon: Meriam Stamp, MD;  Location: [facility];  Service: Gynecology;  Laterality: Bilateral;    TRANSTHORACIC ECHOCARDIOGRAM  10-28-2013   normal LVF,  ef 60-65%,  mild AR , MR and , TR   Patient Active Problem List   Diagnosis Date Noted   S/P carpal tunnel release 04/19/2024   Left carpal tunnel syndrome 12/18/2023   Obesity due to excess calories with serious comorbidity 11/29/2019   Generalized anxiety disorder 11/29/2019   Mild intermittent asthma without complication 11/29/2019   Normochromic normocytic anemia 11/29/2019   Pyelonephritis 05/29/2016   Sepsis (HCC) 05/29/2016   Acute UTI 05/29/2016   Headache    Chest pain 11/01/2013   Anemia    Anxiety    Hypertension    Hidradenitis    Asthma    Female infertility    Hx of varicella    Persistent headaches 10/30/2013   Left sided numbness 10/29/2013   Postpartum hypertension 10/19/2013   Postpartum edema 10/19/2013   Dysmenorrhea 10/28/2007   DYSFUNCTIONAL UTERINE BLEEDING 10/28/2007   POLYCYSTIC OVARIAN DISEASE 05/03/1999   PCOS (polycystic ovarian syndrome) 05/03/1999    ONSET DATE: DOS 02/11/24  REFERRING DIAG: G56.02 (ICD-10-CM) - Left carpal tunnel syndrome   THERAPY DIAG:  Muscle weakness (generalized)  Pain in left wrist  Paresthesia of skin  Rationale for Evaluation and Treatment: Rehabilitation  PERTINENT HISTORY:  Rt CTR in 2016  She states her sx was successful and numbness is gone but pain is pulling,  throbing and sharp around sx site and into distal forearm.  It looks red and somewhat swollen today.    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    PRECAUTIONS: None;  RED FLAGS: None   WEIGHT BEARING RESTRICTIONS: No     SUBJECTIVE:   SUBJECTIVE STATEMENT: ~11 weeks s/p Lt CTR.  She states she is excited to leave for vacation next week, she feels better than she has, tolerating therapy putty activities well.   PAIN:  Are you having pain? Yes: NPRS scale:  1-2/10 at rest now Pain location: Left wrist and surgical area Pain description: Burning and tight Aggravating factors:  Weightbearing and activities Relieving factors: Rest   PATIENT GOALS: To improve the use of left dominant hand and arm  NEXT MD VISIT: 04/19/2024   OBJECTIVE: (All objective assessments below are from initial evaluation on: 04/04/24 unless otherwise specified.)   HAND DOMINANCE: LEFT  ADLs: Overall ADLs: States decreased ability to grab, hold household objects, pain and difficulty to open containers, perform FMS tasks (manipulate fasteners on clothing), mild to moderate bathing problems as well.    FUNCTIONAL OUTCOME MEASURES: Eval: Patient Specific Functional Scale: 3 (lift items, move wrist, open jar)  (Higher Score  =  Better Ability for the Selected Tasks)      UPPER EXTREMITY ROM     Shoulder to Wrist AROM Left eval Lt 04/12/24 Lt 04/19/24 Lt 04/26/24  Forearm supination 80-90     Forearm pronation  90     Wrist flexion 31 39 51 58  Wrist extension 55 52 65 72  Wrist ulnar deviation      Wrist radial deviation      (Blank rows = not tested)   Hand AROM Left eval Lt 04/12/24 LT 04/26/24  Full Fist Ability (or Gap to Distal Palmar Crease) Full but pain, pulling Full full  Thumb Opposition  (Kapandji Scale)  5/10 8/10 9/10  (Blank rows = not tested)   UPPER EXTREMITY MMT:     MMT Left 04/05/24 Lt 04/26/24  Wrist flexion 3-/5 4-/5  Wrist extension 3-/5 5/5  Wrist ulnar deviation    Wrist radial deviation    (Blank rows = not tested)  HAND FUNCTION: 04/26/24: Lt grip 38#   04/19/24:  Grip strength Left: 27#   Eval: Observed weakness in affected Lt hand.  Grip strength Right: 61 lbs, Left: 22 lbs   COORDINATION: 04/26/24: 9 Hole Peg Test Left: 22.6 sec (~20 sec is WFL)   Eval: Observed coordination impairments with affected left hand as seen by inhibitory pain and stiffness.  Details will be tested as needed   SENSATION: Eval:  Light touch intact today, though a bit hypersensitive around the surgical area and distal volar wrist  EDEMA:   Eval:  Mildly  swollen in palm of left hand and distal volar wrist area   OBSERVATIONS:   Eval: Some redness and swelling around the carpal tunnel release as well as the distal volar wrist area.  She is a bit hypersensitive to touch, has some difficulty gliding the tendons through her carpal tunnel with some pain and also some fear and apprehension.  L CTR    TODAY'S TREATMENT:  04/26/24: She starts with active range of motion for new measures showing excellent progress, she also has 10 pounds better grip strength now, and she starts tolerating strengthening to the wrist and arm.  These new exercises are listed below, and she was taught to carefully progress through isometric training,  to isotonic training, to hold arm strengthening over the next 2 weeks as tolerated.  These things should not cause her to flareup, or she can always take a step back with her own progressions.  Next, OT does manual therapy IASTM along the path of the median nerve in the forearm, as well as cupping to the scar for myofascial release and scar mobilization.  She states these things have a benefit but makes her somewhat sore.  She requested them today and states feeling good afterwards.  For safety she was recommended to do no pushing pulling or carrying of heavy luggage while away on her trip.  Exercises - Tendon Glides  - 4 x daily - 5 reps - 3 second hold - Turn Palm Facing Up & Down  - 4 x daily - 15 reps - Bend and Pull Back Wrist SLOWLY  - 4 x daily - 15 reps - Wrist Prayer Stretch  - 4 x daily - 3 reps - 15 second hold - Median Nerve Flossing  - 3 x daily - 5 reps - Fridge Door Stretch  - 4 x daily - 3-5 reps - 15 sec hold - Full Fist  - 2-3 x daily - 5 reps - Seated Claw Fist with Putty  - 2-3 x daily - 5 reps - Thumb Press  - 2-3 x daily - 5 reps - Thumb Opposition with Putty  - 2-3 x daily - 5 reps - Seated Isometric Wrist Flexion Neutral  - 3-4 x daily - 1 sets - 5 reps - 10 sec hold - Seated Isometric Wrist Extension   - 3-4 x daily - 1 sets - 5 reps - 10 sec hold - Wrist Extension with Resistance  - 2-4 x daily - 1-2 sets - 10-15 reps - Wrist Flexion with Resistance  - 2-4 x daily - 1-2 sets - 10-15 reps - Hammer Stretch or Strength   - 2-4 x daily - 1-2 sets - 10-15 reps - Standing Bicep Curls with Resistance  - 2-4 x daily - 1-2 sets - 10-15 reps   PATIENT EDUCATION: Education details: See tx section above for details  Person educated: Patient Education method: Verbal Instruction, Teach back, Handouts  Education comprehension: States and demonstrates understanding, Additional Education required    HOME EXERCISE PROGRAM: Access Code: [email or web address] URL: [email or web address] Date: 04/05/2024 Prepared by: Leartis Proud   GOALS: Goals reviewed with patient? Yes   SHORT TERM GOALS: (STG required if POC>30 days) Target Date: 04/15/2024  Pt will demo/state understanding of initial HEP to improve pain levels and prerequisite motion. Goal status: 04/26/24: MET   LONG TERM GOALS: Target Date: 05/06/24  Pt will improve functional ability by decreased impairment per PSFS assessment from 3 to 7 or better, for better quality of life. Goal status: INITIAL  2.  Pt will improve grip strength in left hand from 20 to lbs to at least 40 lbs for functional use at home and in IADLs. Goal status: INITIAL  3.  Pt will improve A/ROM in left wrist flexion/extension from 31/55 to at least 60 degrees each, to have functional motion for tasks like reach and grasp.  Goal status: INITIAL  4.  Pt will improve strength in left wrist flexion/extension from apparent 3 -/5 MMT to at least 4+/5 MMT to have increased functional ability to carry out selfcare and higher-level homecare tasks with less difficulty. Goal status: INITIAL  5.  Pt will improve coordination skills in left hand and arm, as seen  by within functional limit score on nine-hole peg testing to have increased functional ability to carry out fine  motor tasks (fasteners, etc.) and more complex, coordinated IADLs (meal prep, sports, etc.).  Goal status: INITIAL  6.  Pt will decrease pain at worst from 7-8/10 to 2-3/10 or better to have better sleep and occupational participation in daily roles. Goal status: INITIAL   ASSESSMENT:  CLINICAL IMPRESSION: 04/26/24: She will be out of town next week for vacation, so we will have to perform a progress note the following week to determine the need for more therapy.  She is doing great and if she does not have exacerbation in the next 2 weeks, we may wrap up therapy at that time.     PLAN:  OT FREQUENCY: 1-2x/week  OT DURATION: 4 weeks through 05/06/24 and up to 9 total visits as needed  PLANNED INTERVENTIONS: 97168 OT Re-evaluation, 97535 self care/ADL training, 16109 therapeutic exercise, 97530 therapeutic activity, 97112 neuromuscular re-education, 97140 manual therapy, 97035 ultrasound, 97039 fluidotherapy, 97010 moist heat, 97010 cryotherapy, 97760 Orthotics management and training, 60454 Subsequent splinting/medication, scar mobilization, passive range of motion, compression bandaging, Dry needling, energy conservation, coping strategies training, and patient/family education  RECOMMENDED OTHER SERVICES: none now   CONSULTED AND AGREED WITH PLAN OF CARE: Patient  PLAN FOR NEXT SESSION:   Progress note in 2 weeks after her vacation  Leartis Proud, OTR/L, CHT 04/26/2024, 10:48 AM

## 2024-04-26 ENCOUNTER — Ambulatory Visit (INDEPENDENT_AMBULATORY_CARE_PROVIDER_SITE_OTHER): Admitting: Rehabilitative and Restorative Service Providers"

## 2024-04-26 ENCOUNTER — Encounter: Payer: Self-pay | Admitting: Rehabilitative and Restorative Service Providers"

## 2024-04-26 DIAGNOSIS — R202 Paresthesia of skin: Secondary | ICD-10-CM

## 2024-04-26 DIAGNOSIS — M25532 Pain in left wrist: Secondary | ICD-10-CM

## 2024-04-26 DIAGNOSIS — M6281 Muscle weakness (generalized): Secondary | ICD-10-CM | POA: Diagnosis not present

## 2024-05-09 NOTE — Therapy (Signed)
 OUTPATIENT OCCUPATIONAL THERAPY TREATMENT & PROGRESS NOTE   Patient Name: Erica Lang MRN: [id number] DOB:08-May-1976, 48 y.o., female Today's Date: 05/10/2024  PCP: Delorise Few MD REFERRING PROVIDER:  Sandie Cross, PA-C                Progress Note  Reporting Period 05/06/24 to 05/24/24  05/10/24: She has met 3/6 LTGs, and the other 3/6 goals were almost met, however due to lifting luggage on vacation, she has a return of pain and is a bit anxious to stop the therapy process now. We will continue 2-3 more weeks to help her meet all of her goals.    PLAN:  OT FREQUENCY: 1 x week  OT DURATION: 3 weeks from 05/06/24 - 05/24/24 and up to 7 total visits as needed   See note below for Objective Data and Assessment of Progress/Goals.               END OF SESSION:  OT End of Session - 05/10/24 0909     Visit Number 5    Number of Visits 9    Date for OT Re-Evaluation 05/06/24    Authorization Type BCBS    OT Start Time 0917    OT Stop Time 0959    OT Time Calculation (min) 42 min    Activity Tolerance Patient tolerated treatment well;No increased pain;Patient limited by fatigue;Patient limited by pain    Behavior During Therapy [facility] for tasks assessed/performed             Past Medical History:  Diagnosis Date   Anemia    Anxiety    Bilateral carpal tunnel syndrome    left only per patient   Headache    History of abnormal cervical Pap smear    History of exercise stress test dr Stann Earnest   11-11-2013--  normal/  good exercise capacity, no chest pain, no ischemia   History of pre-eclampsia 11/ 2014   hypertension and postpartum pulmonary edema--  resolved   IBS (irritable bowel syndrome)    Mild asthma    PCOS (polycystic ovarian syndrome)    Past Surgical History:  Procedure Laterality Date   CARPAL TUNNEL RELEASE Right 08/02/2015   Procedure: RIGHT HAND CARPAL TUNNEL RELEASE;  Surgeon: Arvil Birks, MD;  Location: [facility] [place];   Service: Orthopedics;  Laterality: Right;   CHOLECYSTECTOMY N/A 10/27/2014   Procedure: LAPAROSCOPIC CHOLECYSTECTOMY WITH INTRAOPERATIVE CHOLANGIOGRAM;  Surgeon: Fran Imus, MD;  Location: [facility];  Service: General;  Laterality: N/A;   dx laparoscopy     I & D  LEFT VULVAR HEMATOMA, POSTPARTUM  07-05-2009   ROBOTIC ASSISTED TOTAL HYSTERECTOMY WITH SALPINGECTOMY Bilateral 06/18/2017   Procedure: ROBOTIC ASSISTED TOTAL HYSTERECTOMY WITH SALPINGECTOMY;  Surgeon: Meriam Stamp, MD;  Location: [facility];  Service: Gynecology;  Laterality: Bilateral;   TRANSTHORACIC ECHOCARDIOGRAM  10-28-2013   normal LVF,  ef 60-65%,  mild AR , MR and , TR   Patient Active Problem List   Diagnosis Date Noted   S/P carpal tunnel release 04/19/2024   Left carpal tunnel syndrome 12/18/2023   Obesity due to excess calories with serious comorbidity 11/29/2019   Generalized anxiety disorder 11/29/2019   Mild intermittent asthma without complication 11/29/2019   Normochromic normocytic anemia 11/29/2019   Pyelonephritis 05/29/2016   Sepsis (HCC) 05/29/2016   Acute UTI 05/29/2016   Headache    Chest pain 11/01/2013   Anemia    Anxiety    Hypertension  Hidradenitis    Asthma    Female infertility    Hx of varicella    Persistent headaches 10/30/2013   Left sided numbness 10/29/2013   Postpartum hypertension 10/19/2013   Postpartum edema 10/19/2013   Dysmenorrhea 10/28/2007   DYSFUNCTIONAL UTERINE BLEEDING 10/28/2007   POLYCYSTIC OVARIAN DISEASE 05/03/1999   PCOS (polycystic ovarian syndrome) 05/03/1999    ONSET DATE: DOS 02/11/24  REFERRING DIAG: G56.02 (ICD-10-CM) - Left carpal tunnel syndrome   THERAPY DIAG:  Muscle weakness (generalized)  Pain in left wrist  Paresthesia of skin  Rationale for Evaluation and Treatment: Rehabilitation  PERTINENT HISTORY:  Rt CTR in 2016  She states her sx was successful and numbness is gone but pain is pulling, throbing and sharp around sx site and into  distal forearm.  It looks red and somewhat swollen today.    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    PRECAUTIONS: None;  RED FLAGS: None   WEIGHT BEARING RESTRICTIONS: No     SUBJECTIVE:   SUBJECTIVE STATEMENT: ~13 weeks s/p Lt CTR.  She states that she "over did it" on vacation and is a bit worried, though it has calmed down by now.    PAIN:  Are you having pain? Yes: NPRS scale:  0/10 at rest now Pain location: Left wrist and surgical area Pain description: Burning and tight Aggravating factors: Weightbearing and activities Relieving factors: Rest   PATIENT GOALS: To improve the use of left dominant hand and arm  NEXT MD VISIT: 04/19/2024   OBJECTIVE: (All objective assessments below are from initial evaluation on: 04/04/24 unless otherwise specified.)   HAND DOMINANCE: LEFT  ADLs: Overall ADLs: States decreased ability to grab, hold household objects, pain and difficulty to open containers, perform FMS tasks (manipulate fasteners on clothing), mild to moderate bathing problems as well.    FUNCTIONAL OUTCOME MEASURES: 05/10/24: PSFS: 8  Eval: Patient Specific Functional Scale: 3 (lift items, move wrist, open jar)  (Higher Score  =  Better Ability for the Selected Tasks)      UPPER EXTREMITY ROM     Shoulder to Wrist AROM Left eval Lt 04/12/24 Lt 04/19/24 Lt 04/26/24 Lt 05/10/24  Forearm supination 80-90      Forearm pronation  90      Wrist flexion 31 39 51 58 64  Wrist extension 55 52 65 72 70  Wrist ulnar deviation       Wrist radial deviation       (Blank rows = not tested)   Hand AROM Left eval Lt 04/12/24 LT 04/26/24 Lt 05/10/24  Full Fist Ability (or Gap to Distal Palmar Crease) Full but pain, pulling Full full full  Thumb Opposition  (Kapandji Scale)  5/10 8/10 9/10 10/10  (Blank rows = not tested)   UPPER EXTREMITY MMT:     MMT Left 04/05/24 Lt 04/26/24 Lt 05/10/24  Wrist flexion 3-/5 4-/5 4+/5  Wrist extension 3-/5 5/5 4/5  Wrist  ulnar deviation     Wrist radial deviation     (Blank rows = not tested)  HAND FUNCTION: 05/10/24 Lt grip: 26#   04/26/24: Lt grip 38#  04/19/24:  Grip strength Left: 27#   Eval: Observed weakness in affected Lt hand.  Grip strength Right: 61 lbs, Left: 22 lbs   COORDINATION: 05/10/24: 9HPT: Lt: 22sec  approx in [facility]    04/26/24: 9 Hole Peg Test Left: 22.6 sec (~20 sec is WFL)   Eval: Observed coordination impairments with affected left  hand as seen by inhibitory pain and stiffness.  Details will be tested as needed   SENSATION: Eval:  Light touch intact today, though a bit hypersensitive around the surgical area and distal volar wrist  EDEMA:   Eval:  Mildly swollen in palm of left hand and distal volar wrist area   OBSERVATIONS:   Eval: Some redness and swelling around the carpal tunnel release as well as the distal volar wrist area.  She is a bit hypersensitive to touch, has some difficulty gliding the tendons through her carpal tunnel with some pain and also some fear and apprehension.  L CTR    TODAY'S TREATMENT:  05/10/24: Pt performs AROM, gripping, and strength with Lt hand/arm against therapist's resistance for exercise/activities as well as new measures today. OT also discusses home and functional tasks with the pt and reviews goals. She states doing much better with fnl activities now, but having some pain with gripping and extension of the wrist. OT feels strongly that she had exacerbation on vacation (which she admits to) and this is a partial reason why she hasn't met all goals by now.  OT also reviews home exercises and provides updated recommendations. She uses fluidotherapy for desensitization purposes for 5 mins, followed by nerve gliding, and review of putty pinch/grip activities. Pt states understanding and tolerates all well, states need for continued therapy and we will request 2 more visits.    Exercises - Wrist Prayer Stretch  - 4 x daily - 3 reps - 15 second  hold - Median Nerve Flossing  - 3 x daily - 5 reps - Fridge Door Stretch  - 4 x daily - 3-5 reps - 15 sec hold - Full Fist  - 2-3 x daily - 5 reps - Thumb Press  - 2-3 x daily - 5 reps - Thumb Opposition with Putty  - 2-3 x daily - 5 reps - Wrist Extension with Resistance  - 2-4 x daily - 1-2 sets - 10-15 reps - Wrist Flexion with Resistance  - 2-4 x daily - 1-2 sets - 10-15 reps - Hammer Stretch or Strength   - 2-4 x daily - 1-2 sets - 10-15 reps - Standing Bicep Curls with Resistance  - 2-4 x daily - 1-2 sets - 10-15 reps   PATIENT EDUCATION: Education details: See tx section above for details  Person educated: Patient Education method: Verbal Instruction, Teach back, Handouts  Education comprehension: States and demonstrates understanding, Additional Education required    HOME EXERCISE PROGRAM: Access Code: [email or web address] URL: [email or web address] Date: 04/05/2024 Prepared by: Leartis Proud   GOALS: Goals reviewed with patient? Yes   SHORT TERM GOALS: (STG required if POC>30 days) Target Date: 04/15/2024  Pt will demo/state understanding of initial HEP to improve pain levels and prerequisite motion. Goal status: 04/26/24: MET   LONG TERM GOALS: Target Date: 05/06/24  Pt will improve functional ability by decreased impairment per PSFS assessment from 3 to 7 or better, for better quality of life. Goal status: 05/10/24: MET 8   2.  Pt will improve grip strength in left hand from 20 to lbs to at least 40 lbs for functional use at home and in IADLs. Goal status: 05/10/24: was 38# before exacerbation on vacation, now 26#    3.  Pt will improve A/ROM in left wrist flexion/extension from 31/55 to at least 60 degrees each, to have functional motion for tasks like reach and grasp.  Goal status: 05/10/24: MET    4.  Pt  will improve strength in left wrist flexion/extension from apparent 3 -/5 MMT to at least 4+/5 MMT to have increased functional ability to carry out  selfcare and higher-level homecare tasks with less difficulty. Goal status: 05/10/24: partially met in flexion, 4/5 in ext today after vacation    5.  Pt will improve coordination skills in left hand and arm, as seen by within functional limit score on nine-hole peg testing to have increased functional ability to carry out fine motor tasks (fasteners, etc.) and more complex, coordinated IADLs (meal prep, sports, etc.).  Goal status: 05/10/24: considered met today    6.  Pt will decrease pain at worst from 7-8/10 to 2-3/10 or better to have better sleep and occupational participation in daily roles. Goal status: 05/10/24: Not met due to holding heavy luggage on vacation, however has no resting pain, and generally is feeling better     ASSESSMENT:  CLINICAL IMPRESSION: 05/10/24: She has met 3/6 LTGs, and the other 3/6 goals were almost met, however due to lifting luggage on vacation, she has a return of pain and is a bit anxious to stop the therapy process now. We will continue 2-3 more weeks to help her meet all of her goals.     PLAN:  OT FREQUENCY: 1 x week  OT DURATION: 3 weeks from 05/06/24 - 05/24/24 and up to 7 total visits as needed  PLANNED INTERVENTIONS: 97168 OT Re-evaluation, 97535 self care/ADL training, 84166 therapeutic exercise, 97530 therapeutic activity, 97112 neuromuscular re-education, 97140 manual therapy, 97035 ultrasound, 97039 fluidotherapy, 97010 moist heat, 97010 cryotherapy, 97760 Orthotics management and training, 06301 Subsequent splinting/medication, scar mobilization, passive range of motion, compression bandaging, Dry needling, energy conservation, coping strategies training, and patient/family education  RECOMMENDED OTHER SERVICES: none now   CONSULTED AND AGREED WITH PLAN OF CARE: Patient  PLAN FOR NEXT SESSION:   Progress with strength and function as tolerated, start with desensitization   Leartis Proud, OTR/L, CHT 05/10/2024, 10:03 AM

## 2024-05-10 ENCOUNTER — Encounter: Payer: Self-pay | Admitting: Rehabilitative and Restorative Service Providers"

## 2024-05-10 ENCOUNTER — Ambulatory Visit: Admitting: Rehabilitative and Restorative Service Providers"

## 2024-05-10 DIAGNOSIS — R202 Paresthesia of skin: Secondary | ICD-10-CM

## 2024-05-10 DIAGNOSIS — M25532 Pain in left wrist: Secondary | ICD-10-CM | POA: Diagnosis not present

## 2024-05-10 DIAGNOSIS — M6281 Muscle weakness (generalized): Secondary | ICD-10-CM | POA: Diagnosis not present

## 2024-05-16 NOTE — Therapy (Signed)
 OUTPATIENT OCCUPATIONAL THERAPY TREATMENT  NOTE   Patient Name: Erica Lang MRN: [id number] DOB:[birth year], 48 y.o., female Today's Date: 05/17/2024  PCP: Delorise Few MD REFERRING PROVIDER:  Sandie Cross, PA-C      END OF SESSION:  OT End of Session - 05/17/24 1015     Visit Number 6    Number of Visits 7    Date for OT Re-Evaluation 05/06/24    Authorization Type BCBS    OT Start Time 1022    OT Stop Time 1100    OT Time Calculation (min) 38 min    Activity Tolerance Patient tolerated treatment well;No increased pain;Patient limited by fatigue;Patient limited by pain    Behavior During Therapy [facility] for tasks assessed/performed              Past Medical History:  Diagnosis Date   Anemia    Anxiety    Bilateral carpal tunnel syndrome    left only per patient   Headache    History of abnormal cervical Pap smear    History of exercise stress test dr Stann Earnest   11-11-2013--  normal/  good exercise capacity, no chest pain, no ischemia   History of pre-eclampsia 11/ 2014   hypertension and postpartum pulmonary edema--  resolved   IBS (irritable bowel syndrome)    Mild asthma    PCOS (polycystic ovarian syndrome)    Past Surgical History:  Procedure Laterality Date   CARPAL TUNNEL RELEASE Right 08/02/2015   Procedure: RIGHT HAND CARPAL TUNNEL RELEASE;  Surgeon: Arvil Birks, MD;  Location: [facility];  Service: Orthopedics;  Laterality: Right;   CHOLECYSTECTOMY N/A 10/27/2014   Procedure: LAPAROSCOPIC CHOLECYSTECTOMY WITH INTRAOPERATIVE CHOLANGIOGRAM;  Surgeon: Fran Imus, MD;  Location: [facility];  Service: General;  Laterality: N/A;   dx laparoscopy     I & D  LEFT VULVAR HEMATOMA, POSTPARTUM  07-05-2009   ROBOTIC ASSISTED TOTAL HYSTERECTOMY WITH SALPINGECTOMY Bilateral 06/18/2017   Procedure: ROBOTIC ASSISTED TOTAL HYSTERECTOMY WITH SALPINGECTOMY;  Surgeon: Meriam Stamp, MD;  Location: [facility];  Service: Gynecology;  Laterality: Bilateral;    TRANSTHORACIC ECHOCARDIOGRAM  10-28-2013   normal LVF,  ef 60-65%,  mild AR , MR and , TR   Patient Active Problem List   Diagnosis Date Noted   S/P carpal tunnel release 04/19/2024   Left carpal tunnel syndrome 12/18/2023   Obesity due to excess calories with serious comorbidity 11/29/2019   Generalized anxiety disorder 11/29/2019   Mild intermittent asthma without complication 11/29/2019   Normochromic normocytic anemia 11/29/2019   Pyelonephritis 05/29/2016   Sepsis (HCC) 05/29/2016   Acute UTI 05/29/2016   Headache    Chest pain 11/01/2013   Anemia    Anxiety    Hypertension    Hidradenitis    Asthma    Female infertility    Hx of varicella    Persistent headaches 10/30/2013   Left sided numbness 10/29/2013   Postpartum hypertension 10/19/2013   Postpartum edema 10/19/2013   Dysmenorrhea 10/28/2007   DYSFUNCTIONAL UTERINE BLEEDING 10/28/2007   POLYCYSTIC OVARIAN DISEASE 05/03/1999   PCOS (polycystic ovarian syndrome) 05/03/1999    ONSET DATE: DOS 02/11/24  REFERRING DIAG: G56.02 (ICD-10-CM) - Left carpal tunnel syndrome   THERAPY DIAG:  Muscle weakness (generalized)  Pain in left wrist  Paresthesia of skin  Rationale for Evaluation and Treatment: Rehabilitation  PERTINENT HISTORY:  Rt CTR in 2016  She states her sx was successful and numbness is gone but pain is  pulling, throbing and sharp around sx site and into distal forearm.  It looks red and somewhat swollen today.    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    PRECAUTIONS: None;  RED FLAGS: None   WEIGHT BEARING RESTRICTIONS: No     SUBJECTIVE:   SUBJECTIVE STATEMENT: ~14 weeks s/p Lt CTR.  She states "testing the waters" with all activities at home and more chores, etc.  She is feeling less pain today, feeling stronger etc.    PAIN:  Are you having pain? Yes: NPRS scale:   0/10 at rest now Pain location: Left wrist and surgical area Pain description: Burning and tight Aggravating  factors: Weightbearing and activities Relieving factors: Rest   PATIENT GOALS: To improve the use of left dominant hand and arm  NEXT MD VISIT: 04/19/2024   OBJECTIVE: (All objective assessments below are from initial evaluation on: 04/04/24 unless otherwise specified.)   HAND DOMINANCE: LEFT  ADLs: Overall ADLs: States decreased ability to grab, hold household objects, pain and difficulty to open containers, perform FMS tasks (manipulate fasteners on clothing), mild to moderate bathing problems as well.    FUNCTIONAL OUTCOME MEASURES: 05/10/24: PSFS: 8  Eval: Patient Specific Functional Scale: 3 (lift items, move wrist, open jar)  (Higher Score  =  Better Ability for the Selected Tasks)      UPPER EXTREMITY ROM     Shoulder to Wrist AROM Left eval Lt 04/12/24 Lt 04/19/24 Lt 04/26/24 Lt 05/10/24  Forearm supination 80-90      Forearm pronation  90      Wrist flexion 31 39 51 58 64  Wrist extension 55 52 65 72 70  Wrist ulnar deviation       Wrist radial deviation       (Blank rows = not tested)   Hand AROM Left eval Lt 04/12/24 LT 04/26/24 Lt 05/10/24  Full Fist Ability (or Gap to Distal Palmar Crease) Full but pain, pulling Full full full  Thumb Opposition  (Kapandji Scale)  5/10 8/10 9/10 10/10  (Blank rows = not tested)   UPPER EXTREMITY MMT:     MMT Left 04/05/24 Lt 04/26/24 Lt 05/10/24  Wrist flexion 3-/5 4-/5 4+/5  Wrist extension 3-/5 5/5 4/5  Wrist ulnar deviation     Wrist radial deviation     (Blank rows = not tested)  HAND FUNCTION: 05/17/24: Lt grip: 47#    05/10/24 Lt grip: 26#   04/26/24: Lt grip 38#  04/19/24:  Grip strength Left: 27#   Eval: Observed weakness in affected Lt hand.  Grip strength Right: 61 lbs, Left: 22 lbs   COORDINATION: 05/10/24: 9HPT: Lt: 22sec  approx in [facility]    04/26/24: 9 Hole Peg Test Left: 22.6 sec (~20 sec is WFL)   Eval: Observed coordination impairments with affected left hand as seen by inhibitory pain and  stiffness.  Details will be tested as needed   SENSATION: Eval:  Light touch intact today, though a bit hypersensitive around the surgical area and distal volar wrist  EDEMA:   Eval:  Mildly swollen in palm of left hand and distal volar wrist area   OBSERVATIONS:   Eval: Some redness and swelling around the carpal tunnel release as well as the distal volar wrist area.  She is a bit hypersensitive to touch, has some difficulty gliding the tendons through her carpal tunnel with some pain and also some fear and apprehension.  L CTR    TODAY'S TREATMENT:  05/17/24: Desensitization at the start of the session, so we get into functional activities and functional endurance strengthening today.  She does the list of exercises and activities as listed below with rest breaks and intermittent stretches and intermittent nerve glides as needed.  She does have some fatigue and need the small rest breaks, but overall has no pain and does very well.  Her grip strength is much better today as well.   UBE 3.0 resistance x 5 mins at least 45 RPM  Biceps rope dynamic pull 15#  x15  Triceps rope dynamic push 20#  x15  Chest press 15# x 15  Farmer's Carry 15# Lt hand x133ft  x 2  Flexbar pro/sup dynamics x15 Flexbar wrist flexion/ext dynamics x10 Catch/Throw large soft ball Simulated can opener and kitchen utensil velcro board activity      PATIENT EDUCATION: Education details: See tx section above for details  Person educated: Patient Education method: Engineer, structural, Teach back, Handouts  Education comprehension: States and demonstrates understanding, Additional Education required    HOME EXERCISE PROGRAM: Access Code: [email or web address] URL: [email or web address] Date: 04/05/2024 Prepared by: Leartis Proud   GOALS: Goals reviewed with patient? Yes   SHORT TERM GOALS: (STG required if POC>30 days) Target Date: 04/15/2024  Pt will demo/state understanding of initial HEP to  improve pain levels and prerequisite motion. Goal status: 04/26/24: MET   LONG TERM GOALS: Target Date: 05/06/24  Pt will improve functional ability by decreased impairment per PSFS assessment from 3 to 7 or better, for better quality of life. Goal status: 05/10/24: MET 8   2.  Pt will improve grip strength in left hand from 20 to lbs to at least 40 lbs for functional use at home and in IADLs. Goal status: 05/10/24: was 38# before exacerbation on vacation, now 26#    3.  Pt will improve A/ROM in left wrist flexion/extension from 31/55 to at least 60 degrees each, to have functional motion for tasks like reach and grasp.  Goal status: 05/10/24: MET    4.  Pt will improve strength in left wrist flexion/extension from apparent 3 -/5 MMT to at least 4+/5 MMT to have increased functional ability to carry out selfcare and higher-level homecare tasks with less difficulty. Goal status: 05/10/24: partially met in flexion, 4/5 in ext today after vacation    5.  Pt will improve coordination skills in left hand and arm, as seen by within functional limit score on nine-hole peg testing to have increased functional ability to carry out fine motor tasks (fasteners, etc.) and more complex, coordinated IADLs (meal prep, sports, etc.).  Goal status: 05/10/24: considered met today    6.  Pt will decrease pain at worst from 7-8/10 to 2-3/10 or better to have better sleep and occupational participation in daily roles. Goal status: 05/10/24: Not met due to holding heavy luggage on vacation, however has no resting pain, and generally is feeling better     ASSESSMENT:  CLINICAL IMPRESSION: 05/17/24: She did excellent with higher level dynamic functional activities and strengthening today.  We will continue this and she will likely be able to discharge after next week's last planned session.  05/10/24: She has met 3/6 LTGs, and the other 3/6 goals were almost met, however due to lifting luggage on vacation, she has a  return of pain and is a bit anxious to stop the therapy process now. We will continue 2-3 more weeks to help her meet all of her goals.  PLAN:  OT FREQUENCY: 1 x week  OT DURATION: 3 weeks from 05/06/24 - 05/24/24 and up to 7 total visits as needed  PLANNED INTERVENTIONS: 97168 OT Re-evaluation, 97535 self care/ADL training, 16109 therapeutic exercise, 97530 therapeutic activity, 97112 neuromuscular re-education, 97140 manual therapy, 97035 ultrasound, 97039 fluidotherapy, 97010 moist heat, 97010 cryotherapy, 97760 Orthotics management and training, 60454 Subsequent splinting/medication, scar mobilization, passive range of motion, compression bandaging, Dry needling, energy conservation, coping strategies training, and patient/family education  RECOMMENDED OTHER SERVICES: none now   CONSULTED AND AGREED WITH PLAN OF CARE: Patient  PLAN FOR NEXT SESSION:   Perform a good strengthening session with functional activities, address any last concerns or issues, progress note for planned discharge  Leartis Proud, OTR/L, CHT 05/17/2024, 11:02 AM

## 2024-05-17 ENCOUNTER — Encounter: Payer: Self-pay | Admitting: Rehabilitative and Restorative Service Providers"

## 2024-05-17 ENCOUNTER — Ambulatory Visit: Admitting: Rehabilitative and Restorative Service Providers"

## 2024-05-17 DIAGNOSIS — M25532 Pain in left wrist: Secondary | ICD-10-CM | POA: Diagnosis not present

## 2024-05-17 DIAGNOSIS — M6281 Muscle weakness (generalized): Secondary | ICD-10-CM

## 2024-05-17 DIAGNOSIS — R202 Paresthesia of skin: Secondary | ICD-10-CM

## 2024-05-17 NOTE — Therapy (Signed)
 OUTPATIENT OCCUPATIONAL THERAPY TREATMENT & DISCHARGE  NOTE   Patient Name: Erica Lang MRN: [id number] DOB:May 25, 1976, 48 y.o., female Today's Date: 05/24/2024  PCP: Delorise Few MD REFERRING PROVIDER:  Sandie Cross, PA-C                 OCCUPATIONAL THERAPY DISCHARGE SUMMARY  Visits from Start of Care: 7  Current functional level related to goals / functional outcomes: 05/24/24: She has met all of her long-term goals and has been cooking "rigorously."  She states she is ready to get back to work and get into her routine and will discharge therapy today.  Education / Equipment: Pt has all needed materials and education. Pt understands how to continue on with self-management. See tx notes for more details.   Patient agrees to discharge due to max benefits received from outpatient occupational therapy / hand therapy at this time.   Leartis Proud, OTR/L, CHT 05/24/24            END OF SESSION:  OT End of Session - 05/24/24 1015     Visit Number 7    Number of Visits 7    Date for OT Re-Evaluation 05/06/24    Authorization Type BCBS    OT Start Time 1015    OT Stop Time 1041    OT Time Calculation (min) 26 min    Activity Tolerance Patient tolerated treatment well;No increased pain    Behavior During Therapy WFL for tasks assessed/performed               Past Medical History:  Diagnosis Date   Anemia    Anxiety    Bilateral carpal tunnel syndrome    left only per patient   Headache    History of abnormal cervical Pap smear    History of exercise stress test dr Stann Earnest   11-11-2013--  normal/  good exercise capacity, no chest pain, no ischemia   History of pre-eclampsia 11/ 2014   hypertension and postpartum pulmonary edema--  resolved   IBS (irritable bowel syndrome)    Mild asthma    PCOS (polycystic ovarian syndrome)    Past Surgical History:  Procedure Laterality Date   CARPAL TUNNEL RELEASE Right 08/02/2015   Procedure: RIGHT  HAND CARPAL TUNNEL RELEASE;  Surgeon: Arvil Birks, MD;  Location: [facility];  Service: Orthopedics;  Laterality: Right;   CHOLECYSTECTOMY N/A 10/27/2014   Procedure: LAPAROSCOPIC CHOLECYSTECTOMY WITH INTRAOPERATIVE CHOLANGIOGRAM;  Surgeon: Fran Imus, MD;  Location: [facility];  Service: General;  Laterality: N/A;   dx laparoscopy     I & D  LEFT VULVAR HEMATOMA, POSTPARTUM  07-05-2009   ROBOTIC ASSISTED TOTAL HYSTERECTOMY WITH SALPINGECTOMY Bilateral 06/18/2017   Procedure: ROBOTIC ASSISTED TOTAL HYSTERECTOMY WITH SALPINGECTOMY;  Surgeon: Meriam Stamp, MD;  Location: [facility];  Service: Gynecology;  Laterality: Bilateral;   TRANSTHORACIC ECHOCARDIOGRAM  10-28-2013   normal LVF,  ef 60-65%,  mild AR , MR and , TR   Patient Active Problem List   Diagnosis Date Noted   S/P carpal tunnel release 04/19/2024   Left carpal tunnel syndrome 12/18/2023   Obesity due to excess calories with serious comorbidity 11/29/2019   Generalized anxiety disorder 11/29/2019   Mild intermittent asthma without complication 11/29/2019   Normochromic normocytic anemia 11/29/2019   Pyelonephritis 05/29/2016   Sepsis (HCC) 05/29/2016   Acute UTI 05/29/2016   Headache    Chest pain 11/01/2013   Anemia    Anxiety  Hypertension    Hidradenitis    Asthma    Female infertility    Hx of varicella    Persistent headaches 10/30/2013   Left sided numbness 10/29/2013   Postpartum hypertension 10/19/2013   Postpartum edema 10/19/2013   Dysmenorrhea 10/28/2007   DYSFUNCTIONAL UTERINE BLEEDING 10/28/2007   POLYCYSTIC OVARIAN DISEASE 05/03/1999   PCOS (polycystic ovarian syndrome) 05/03/1999    ONSET DATE: DOS 02/11/24  REFERRING DIAG: G56.02 (ICD-10-CM) - Left carpal tunnel syndrome   THERAPY DIAG:  Muscle weakness (generalized)  Pain in left wrist  Paresthesia of skin  Rationale for Evaluation and Treatment: Rehabilitation  PERTINENT HISTORY:  Rt CTR in 2016  She states her sx was  successful and numbness is gone but pain is pulling, throbing and sharp around sx site and into distal forearm.  It looks red and somewhat swollen today.    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    PRECAUTIONS: None;  RED FLAGS: None   WEIGHT BEARING RESTRICTIONS: No     SUBJECTIVE:   SUBJECTIVE STATEMENT: ~14 weeks s/p Lt CTR. She states cooking a large meal and not having significant pain or problem now.     PAIN:  Are you having pain? None now or significant in past week    PATIENT GOALS: To improve the use of left dominant hand and arm  NEXT MD VISIT: 04/19/2024   OBJECTIVE: (All objective assessments below are from initial evaluation on: 04/04/24 unless otherwise specified.)   HAND DOMINANCE: LEFT  ADLs: Overall ADLs: States doing well with all home tasks now    FUNCTIONAL OUTCOME MEASURES: 05/10/24: PSFS: 8  Eval: Patient Specific Functional Scale: 3 (lift items, move wrist, open jar)  (Higher Score  =  Better Ability for the Selected Tasks)      UPPER EXTREMITY ROM     Shoulder to Wrist AROM Left eval Lt 05/24/24  Forearm supination 80-90   Forearm pronation  90   Wrist flexion 31 70  Wrist extension 55 70  Wrist ulnar deviation    Wrist radial deviation    (Blank rows = not tested)   Hand AROM Left eval Lt 05/10/24  Full Fist Ability (or Gap to Distal Palmar Crease) Full but pain, pulling full  Thumb Opposition  (Kapandji Scale)  5/10 10/10  (Blank rows = not tested)   UPPER EXTREMITY MMT:     MMT Left 04/05/24 Lt 05/24/24  Wrist flexion 3-/5 5/5  Wrist extension 3-/5 5/5  Wrist ulnar deviation    Wrist radial deviation    (Blank rows = not tested)  HAND FUNCTION: 05/24/24: Lt grip: 60#    Eval: Observed weakness in affected Lt hand.  Grip strength Right: 61 lbs, Left: 22 lbs   COORDINATION: 05/10/24: 9HPT: Lt: 22sec  approx in [facility]    OBSERVATIONS:   05/24/24: no swelling, no hypersensitivity    TODAY'S TREATMENT:   05/24/24:  Pt performs AROM, gripping, and strength with left hand and arm against therapist's resistance for exercise/activities as well as new measures today. OT also discusses home and functional tasks with the pt and reviews goals.  OT also has her continue progressive resistive exercises and activities as listed below, which she tolerates much better even this week.  OT reviews home exercises and provides final recommendations. Pt states understanding and tolerates upgrades well, she has no more significant functional concerns or complaints, and she will try to use compensation or even supportive bracing as able when she returns  to work.     UBE 4.2 resistance x 5 mins at least 45 RPM  Biceps rope dynamic pull 20#  x15  Triceps rope dynamic push 25#  x15  Chest press 20# x 15  Farmer's Carry 20# Lt hand x172ft  x 2    PATIENT EDUCATION: Education details: See tx section above for details  Person educated: Patient Education method: Engineer, structural, Teach back, Handouts  Education comprehension: States and demonstrates understanding, Additional Education required    HOME EXERCISE PROGRAM: Access Code: [email or web address] URL: [email or web address] Date: 04/05/2024 Prepared by: Leartis Proud   GOALS: Goals reviewed with patient? Yes   SHORT TERM GOALS: (STG required if POC>30 days) Target Date: 04/15/2024  Pt will demo/state understanding of initial HEP to improve pain levels and prerequisite motion. Goal status: 04/26/24: MET   LONG TERM GOALS: Target Date: 05/06/24  Pt will improve functional ability by decreased impairment per PSFS assessment from 3 to 7 or better, for better quality of life. Goal status: 05/10/24: MET 8   2.  Pt will improve grip strength in left hand from 20 to lbs to at least 40 lbs for functional use at home and in IADLs. Goal status: 05/24/24: MET 60#  3.  Pt will improve A/ROM in left wrist flexion/extension from 31/55 to at least 60 degrees  each, to have functional motion for tasks like reach and grasp.  Goal status: 05/10/24: MET    4.  Pt will improve strength in left wrist flexion/extension from apparent 3 -/5 MMT to at least 4+/5 MMT to have increased functional ability to carry out selfcare and higher-level homecare tasks with less difficulty. Goal status: 05/24/24: MET 5/5  5.  Pt will improve coordination skills in left hand and arm, as seen by within functional limit score on nine-hole peg testing to have increased functional ability to carry out fine motor tasks (fasteners, etc.) and more complex, coordinated IADLs (meal prep, sports, etc.).  Goal status: 05/10/24: considered met today    6.  Pt will decrease pain at worst from 7-8/10 to 2-3/10 or better to have better sleep and occupational participation in daily roles. Goal status:  05/24/24: no significant pain in the past week   ASSESSMENT:  CLINICAL IMPRESSION: 05/24/24: She has met all of her long-term goals and has been cooking "rigorously."  She states she is ready to get back to work and get into her routine and will discharge therapy today.    PLAN:  OT FREQUENCY: D/C OT DURATION: D/C  PLANNED INTERVENTIONS: 97168 OT Re-evaluation, 97535 self care/ADL training, 04540 therapeutic exercise, 97530 therapeutic activity, 97112 neuromuscular re-education, 97140 manual therapy, 97035 ultrasound, 97039 fluidotherapy, 97010 moist heat, 97010 cryotherapy, 97760 Orthotics management and training, 98119 Subsequent splinting/medication, scar mobilization, passive range of motion, compression bandaging, Dry needling, energy conservation, coping strategies training, and patient/family education  RECOMMENDED OTHER SERVICES: none now   CONSULTED AND AGREED WITH PLAN OF CARE: Patient  PLAN FOR NEXT SESSION:   N/A, D/C    Leartis Proud, OTR/L, CHT 05/24/2024, 10:44 AM

## 2024-05-22 ENCOUNTER — Telehealth: Admitting: Nurse Practitioner

## 2024-05-22 DIAGNOSIS — R399 Unspecified symptoms and signs involving the genitourinary system: Secondary | ICD-10-CM | POA: Diagnosis not present

## 2024-05-22 MED ORDER — CEPHALEXIN 500 MG PO CAPS: 500.0000 mg | ORAL_CAPSULE | Freq: Two times a day (BID) | ORAL | 0 refills | Status: AC

## 2024-05-22 NOTE — Progress Notes (Signed)
 I have spent 5 minutes in review of e-visit questionnaire, review and updating patient chart, medical decision making and response to patient.   Claiborne Rigg, NP

## 2024-05-22 NOTE — Progress Notes (Signed)

## 2024-05-24 ENCOUNTER — Ambulatory Visit: Admitting: Rehabilitative and Restorative Service Providers"

## 2024-05-24 ENCOUNTER — Encounter: Payer: Self-pay | Admitting: Rehabilitative and Restorative Service Providers"

## 2024-05-24 DIAGNOSIS — M25532 Pain in left wrist: Secondary | ICD-10-CM | POA: Diagnosis not present

## 2024-05-24 DIAGNOSIS — M6281 Muscle weakness (generalized): Secondary | ICD-10-CM | POA: Diagnosis not present

## 2024-05-24 DIAGNOSIS — R202 Paresthesia of skin: Secondary | ICD-10-CM | POA: Diagnosis not present

## 2024-05-31 ENCOUNTER — Encounter: Payer: Self-pay | Admitting: Physician Assistant

## 2024-05-31 ENCOUNTER — Ambulatory Visit: Admitting: Physician Assistant

## 2024-05-31 DIAGNOSIS — G5602 Carpal tunnel syndrome, left upper limb: Secondary | ICD-10-CM

## 2024-05-31 NOTE — Progress Notes (Signed)
 Post-Op Visit Note   Patient: Erica Lang           Date of Birth: Jul 11, 1976           MRN: [id number] Visit Date: 05/31/2024 PCP: Abraham Abo, MD   Assessment & Plan:  Chief Complaint:  Chief Complaint  Patient presents with   Left Wrist - Follow-up    Left carpal tunnel release 02/11/2024   Visit Diagnoses:  1. Left carpal tunnel syndrome     Plan: Patient is a very pleasant 49 year old female who comes in today nearly 4 months status post left carpal tunnel release 02/11/2024.  She has been in OT and is done well.  She does have some peri-incisional soreness.  No paresthesias.  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  Examination of her left hand reveals a fully healed surgical scar without complication.  Fingers warm well-perfused.  She is neurovascularly intact distally.  At this point, she will continue to advance with activity.  Return to work note provided for this coming Monday.  Follow-up with us  as needed.  Call with concerns or questions.  Follow-Up Instructions: Return if symptoms worsen or fail to improve.   Orders:  No orders of the defined types were placed in this encounter.  No orders of the defined types were placed in this encounter.   Imaging: No new imaging  PMFS History: Patient Active Problem List   Diagnosis Date Noted   S/P carpal tunnel release 04/19/2024   Left carpal tunnel syndrome 12/18/2023   Obesity due to excess calories with serious comorbidity 11/29/2019   Generalized anxiety disorder 11/29/2019   Mild intermittent asthma without complication 11/29/2019   Normochromic normocytic anemia 11/29/2019   Pyelonephritis 05/29/2016   Sepsis (HCC) 05/29/2016   Acute UTI 05/29/2016   Headache    Chest pain 11/01/2013   Anemia    Anxiety    Hypertension    Hidradenitis    Asthma    Female infertility    Hx of varicella    Persistent headaches 10/30/2013   Left sided numbness 10/29/2013    Postpartum hypertension 10/19/2013   Postpartum edema 10/19/2013   Dysmenorrhea 10/28/2007   DYSFUNCTIONAL UTERINE BLEEDING 10/28/2007   POLYCYSTIC OVARIAN DISEASE 05/03/1999   PCOS (polycystic ovarian syndrome) 05/03/1999   Past Medical History:  Diagnosis Date   Anemia    Anxiety    Bilateral carpal tunnel syndrome    left only per patient   Headache    History of abnormal cervical Pap smear    History of exercise stress test dr Stann Earnest   11-11-2013--  normal/  good exercise capacity, no chest pain, no ischemia   History of pre-eclampsia 11/ 2014   hypertension and postpartum pulmonary edema--  resolved   IBS (irritable bowel syndrome)    Mild asthma    PCOS (polycystic ovarian syndrome)     Family History  Problem Relation Age of Onset   Stroke Mother    Hypertension Mother    Heart disease Mother    Deep vein thrombosis Mother        both legs, arms and back, 2 blood clots migrted to her chest area   Diabetes Mother     Past Surgical History:  Procedure Laterality Date   CARPAL TUNNEL RELEASE Right 08/02/2015   Procedure: RIGHT HAND CARPAL TUNNEL RELEASE;  Surgeon: Arvil Birks, MD;  Location: [facility] [place];  Service: Orthopedics;  Laterality: Right;   CHOLECYSTECTOMY N/A 10/27/2014   Procedure: LAPAROSCOPIC CHOLECYSTECTOMY WITH INTRAOPERATIVE CHOLANGIOGRAM;  Surgeon: Fran Imus, MD;  Location: [facility];  Service: General;  Laterality: N/A;   dx laparoscopy     I & D  LEFT VULVAR HEMATOMA, POSTPARTUM  07-05-2009   ROBOTIC ASSISTED TOTAL HYSTERECTOMY WITH SALPINGECTOMY Bilateral 06/18/2017   Procedure: ROBOTIC ASSISTED TOTAL HYSTERECTOMY WITH SALPINGECTOMY;  Surgeon: Meriam Stamp, MD;  Location: [facility];  Service: Gynecology;  Laterality: Bilateral;   TRANSTHORACIC ECHOCARDIOGRAM  10-28-2013   normal LVF,  ef 60-65%,  mild AR , MR and , TR   Social History   Occupational History    Employer: [employer]    Comment: [employer]   Tobacco Use   Smoking status: Never    Passive exposure: Never   Smokeless tobacco: Never  Vaping Use   Vaping status: Never Used  Substance and Sexual Activity   Alcohol use: Yes    Alcohol/week: 0.0 standard drinks of alcohol    Comment: occasional   Drug use: No   Sexual activity: Yes    Birth control/protection: Injection    Comment: depo

## 2024-09-07 ENCOUNTER — Encounter: Admitting: Family Medicine

## 2024-10-17 ENCOUNTER — Encounter: Payer: Self-pay | Admitting: Family Medicine

## 2024-10-17 ENCOUNTER — Ambulatory Visit (INDEPENDENT_AMBULATORY_CARE_PROVIDER_SITE_OTHER): Admitting: Family Medicine

## 2024-10-17 VITALS — BP 129/83 | HR 84 | Ht 69.5 in | Wt 195.0 lb

## 2024-10-17 DIAGNOSIS — Z0000 Encounter for general adult medical examination without abnormal findings: Secondary | ICD-10-CM | POA: Diagnosis not present

## 2024-10-17 DIAGNOSIS — Z1329 Encounter for screening for other suspected endocrine disorder: Secondary | ICD-10-CM | POA: Diagnosis not present

## 2024-10-17 DIAGNOSIS — F4323 Adjustment disorder with mixed anxiety and depressed mood: Secondary | ICD-10-CM

## 2024-10-17 DIAGNOSIS — Z23 Encounter for immunization: Secondary | ICD-10-CM

## 2024-10-17 DIAGNOSIS — Z130 Encounter for screening for diseases of the blood and blood-forming organs and certain disorders involving the immune mechanism: Secondary | ICD-10-CM | POA: Diagnosis not present

## 2024-10-17 DIAGNOSIS — Z136 Encounter for screening for cardiovascular disorders: Secondary | ICD-10-CM

## 2024-10-17 DIAGNOSIS — Z1211 Encounter for screening for malignant neoplasm of colon: Secondary | ICD-10-CM

## 2024-10-17 DIAGNOSIS — Z13228 Encounter for screening for other metabolic disorders: Secondary | ICD-10-CM

## 2024-10-17 MED ORDER — SERTRALINE HCL 50 MG PO TABS: 50.0000 mg | ORAL_TABLET | Freq: Every day | ORAL | 1 refills | Status: DC

## 2024-10-17 NOTE — Progress Notes (Unsigned)
 Established Patient Office Visit  Subjective    Patient ID: Erica Lang, female    DOB: 10-Sep-1976  Age: 48 y.o. MRN: [id number]  CC:  Chief Complaint  Patient presents with   Annual Exam    HPI Erica Lang presents or routine annual exam. Patient reports that she has significantly increased social stressors .   Outpatient Encounter Medications as of 10/17/2024  Medication Sig   albuterol  (PROVENTIL  HFA;VENTOLIN  HFA) 108 (90 BASE) MCG/ACT inhaler Inhale 2 puffs into the lungs every 6 (six) hours as needed for wheezing.   Biotin 89999 MCG TBDP Take 10,000 mcg by mouth daily.   Calcium Carb-Cholecalciferol (CALCIUM 600 + D PO) Take 1 tablet by mouth daily.   calcium carbonate (TUMS - DOSED IN MG ELEMENTAL CALCIUM) 500 MG chewable tablet Chew 1 tablet by mouth daily as needed for indigestion or heartburn.   cetirizine-pseudoephedrine (ZYRTEC-D) 5-120 MG tablet Take 1 tablet by mouth 2 (two) times daily.   ferrous sulfate 325 (65 FE) MG EC tablet Take 325 mg by mouth at bedtime.   Potassium 99 MG TABS Take 99 mg by mouth daily.   sertraline (ZOLOFT) 50 MG tablet Take 1 tablet (50 mg total) by mouth daily.   Vitamin D , Ergocalciferol , (DRISDOL ) 1.25 MG (50000 UNIT) CAPS capsule Take 1 capsule (50,000 Units total) by mouth every 7 (seven) days.   acetaminophen -codeine  (TYLENOL  #3) 300-30 MG tablet Take 1-2 tablets by mouth 2 (two) times daily as needed. (Patient not taking: Reported on 03/07/2024)   EPINEPHrine  (EPI-PEN) 0.3 mg/0.3 mL DEVI Inject 0.3 mLs (0.3 mg total) into the muscle once.   gabapentin  (NEURONTIN ) 100 MG capsule Take 2-3 capsules (200-300 mg total) by mouth at bedtime as needed. (Patient not taking: Reported on 10/17/2024)   HYDROcodone -acetaminophen  (NORCO) 5-325 MG tablet Take 1 tablet by mouth 3 (three) times daily as needed. To be taken after surgery (Patient not taking: Reported on 10/17/2024)   Melatonin 10 MG TABS Take 10 mg by mouth at bedtime as needed  (sleep). (Patient not taking: Reported on 10/17/2024)   ondansetron  (ZOFRAN ) 4 MG tablet Take 1 tablet (4 mg total) by mouth every 8 (eight) hours as needed for nausea or vomiting. (Patient not taking: Reported on 10/17/2024)   No facility-administered encounter medications on file as of 10/17/2024.    Past Medical History:  Diagnosis Date   Anemia    Anxiety    Bilateral carpal tunnel syndrome    left only per patient   Headache    History of abnormal cervical Pap smear    History of exercise stress test dr delford   11-11-2013--  normal/  good exercise capacity, no chest pain, no ischemia   History of pre-eclampsia 11/ 2014   hypertension and postpartum pulmonary edema--  resolved   IBS (irritable bowel syndrome)    Mild asthma    PCOS (polycystic ovarian syndrome)     Past Surgical History:  Procedure Laterality Date   CARPAL TUNNEL RELEASE Right 08/02/2015   Procedure: RIGHT HAND CARPAL TUNNEL RELEASE;  Surgeon: Prentice Pagan, MD;  Location: [facility] [place];  Service: Orthopedics;  Laterality: Right;   CHOLECYSTECTOMY N/A 10/27/2014   Procedure: LAPAROSCOPIC CHOLECYSTECTOMY WITH INTRAOPERATIVE CHOLANGIOGRAM;  Surgeon: Camellia CHRISTELLA Blush, MD;  Location: [facility];  Service: General;  Laterality: N/A;   dx laparoscopy     I & D  LEFT VULVAR HEMATOMA, POSTPARTUM  07-05-2009   ROBOTIC ASSISTED TOTAL HYSTERECTOMY WITH SALPINGECTOMY Bilateral 06/18/2017  Procedure: ROBOTIC ASSISTED TOTAL HYSTERECTOMY WITH SALPINGECTOMY;  Surgeon: Gorge Ade, MD;  Location: [facility];  Service: Gynecology;  Laterality: Bilateral;   TRANSTHORACIC ECHOCARDIOGRAM  10-28-2013   normal LVF,  ef 60-65%,  mild AR , MR and , TR    Family History  Problem Relation Age of Onset   Stroke Mother    Hypertension Mother    Heart disease Mother    Deep vein thrombosis Mother        both legs, arms and back, 2 blood clots migrted to her chest area   Diabetes Mother     Social History   Socioeconomic  History   Marital status: Married    Spouse name: Not on file   Number of children: 2   Years of education: Not on file   Highest education level: Associate degree: occupational, Scientist, product/process development, or vocational program  Occupational History    Employer: [employer]    Comment: [employer]  Tobacco Use   Smoking status: Never    Passive exposure: Never   Smokeless tobacco: Never  Vaping Use   Vaping status: Never Used  Substance and Sexual Activity   Alcohol use: Yes    Alcohol/week: 0.0 standard drinks of alcohol    Comment: occasional   Drug use: No   Sexual activity: Yes    Birth control/protection: Injection    Comment: depo  Other Topics Concern   Not on file  Social History Narrative   Not on file   Social Drivers of Health   Financial Resource Strain: Low Risk  (10/14/2024)   Overall Financial Resource Strain (CARDIA)    Difficulty of Paying Living Expenses: Not very hard  Food Insecurity: No Food Insecurity (10/14/2024)   Hunger Vital Sign    Worried About Running Out of Food in the Last Year: Never true    Ran Out of Food in the Last Year: Never true  Transportation Needs: No Transportation Needs (10/14/2024)   PRAPARE - Administrator, Civil Service (Medical): No    Lack of Transportation (Non-Medical): No  Physical Activity: Insufficiently Active (10/14/2024)   Exercise Vital Sign    Days of Exercise per Week: 4 days    Minutes of Exercise per Session: 30 min  Stress: Stress Concern Present (10/14/2024)   [employer] of Occupational Health - Occupational Stress Questionnaire    Feeling of Stress: Rather much  Social Connections: Moderately Integrated (10/14/2024)   Social Connection and Isolation Panel    Frequency of Communication with Friends and Family: Three times a week    Frequency of Social Gatherings with Friends and Family: Patient declined    Attends Religious Services: More than 4 times per year    Active  Member of [employer] or Organizations: No    Attends Engineer, structural: Not on file    Marital Status: Married  Catering manager Violence: Not At Risk (12/08/2023)   Humiliation, Afraid, Rape, and Kick questionnaire    Fear of Current or Ex-Partner: No    Emotionally Abused: No    Physically Abused: No    Sexually Abused: No    Review of Systems  Psychiatric/Behavioral:  Positive for depression. Negative for suicidal ideas. The patient is nervous/anxious and has insomnia.   All other systems reviewed and are negative.       Objective    BP 129/83   Pulse 84   Ht 5' 9.5 (1.765 m)   Wt 195 lb (88.5 kg)  LMP 06/05/2017   SpO2 99%   BMI 28.38 kg/m   Physical Exam Vitals and nursing note reviewed.  Constitutional:      General: She is not in acute distress. HENT:     Head: Normocephalic and atraumatic.     Right Ear: Tympanic membrane, ear canal and external ear normal.     Left Ear: Tympanic membrane, ear canal and external ear normal.     Nose: Nose normal.     Mouth/Throat:     Mouth: Mucous membranes are moist.     Pharynx: Oropharynx is clear.  Eyes:     Conjunctiva/sclera: Conjunctivae normal.     Pupils: Pupils are equal, round, and reactive to light.  Neck:     Thyroid : No thyromegaly.  Cardiovascular:     Rate and Rhythm: Normal rate and regular rhythm.     Heart sounds: Normal heart sounds. No murmur heard. Pulmonary:     Effort: Pulmonary effort is normal. No respiratory distress.     Breath sounds: Normal breath sounds.  Abdominal:     General: There is no distension.     Palpations: Abdomen is soft. There is no mass.     Tenderness: There is no abdominal tenderness.  Musculoskeletal:        General: Normal range of motion.     Cervical back: Normal range of motion and neck supple.  Skin:    General: Skin is warm and dry.  Neurological:     General: No focal deficit present.     Mental Status: She is alert and oriented to person, place,  and time.  Psychiatric:        Mood and Affect: Mood is anxious and depressed. Affect is tearful.        Behavior: Behavior normal.         Assessment & Plan:   Annual physical exam -     CMP14+EGFR  Screening for deficiency anemia -     CBC with Differential/Platelet  Encounter for screening for cardiovascular disorders -     Lipid panel  Screening for endocrine/metabolic/immunity disorders -     VITAMIN D  25 Hydroxy (Vit-D Deficiency, Fractures) -     Hemoglobin A1c  Screening for colon cancer -     Cologuard  Needs flu shot  Situational mixed anxiety and depressive disorder  Other orders -     Sertraline HCl; Take 1 tablet (50 mg total) by mouth daily.  Dispense: 30 tablet; Refill: 1     Return in about 4 weeks (around 11/14/2024) for follow up.   Tanda Raguel SQUIBB, MD

## 2024-10-19 LAB — CMP14+EGFR
ALT: 12 IU/L (ref 0–32)
AST: 15 IU/L (ref 0–40)
Albumin: 4.5 g/dL (ref 3.9–4.9)
Alkaline Phosphatase: 72 IU/L (ref 41–116)
BUN/Creatinine Ratio: 30 — ABNORMAL HIGH (ref 9–23)
BUN: 12 mg/dL (ref 6–24)
Bilirubin Total: <0.2 mg/dL (ref 0.0–1.2)
CO2: 26 mmol/L (ref 20–29)
Calcium: 10 mg/dL (ref 8.7–10.2)
Chloride: 102 mmol/L (ref 96–106)
Creatinine, Ser: 0.4 mg/dL — ABNORMAL LOW (ref 0.57–1.00)
Globulin, Total: 2.6 g/dL (ref 1.5–4.5)
Glucose: 82 mg/dL (ref 70–99)
Potassium: 4.9 mmol/L (ref 3.5–5.2)
Sodium: 140 mmol/L (ref 134–144)
Total Protein: 7.1 g/dL (ref 6.0–8.5)
eGFR: 120 mL/min/1.73 (ref >59)

## 2024-10-19 LAB — HEMOGLOBIN A1C
Est. average glucose Bld gHb Est-mCnc: 114 mg/dL
Hgb A1c MFr Bld: 5.6 % (ref 4.8–5.6)

## 2024-10-19 LAB — CBC WITH DIFFERENTIAL/PLATELET
Basophils Absolute: 0 x10E3/uL (ref 0.0–0.2)
Basos: 1 %
EOS (ABSOLUTE): 0.1 x10E3/uL (ref 0.0–0.4)
Eos: 1 %
Hematocrit: 36.9 % (ref 34.0–46.6)
Hemoglobin: 12.3 g/dL (ref 11.1–15.9)
Immature Grans (Abs): 0 x10E3/uL (ref 0.0–0.1)
Immature Granulocytes: 0 %
Lymphocytes Absolute: 2.1 x10E3/uL (ref 0.7–3.1)
Lymphs: 33 %
MCH: 32.8 pg (ref 26.6–33.0)
MCHC: 33.3 g/dL (ref 31.5–35.7)
MCV: 98 fL — ABNORMAL HIGH (ref 79–97)
Monocytes Absolute: 0.4 x10E3/uL (ref 0.1–0.9)
Monocytes: 7 %
Neutrophils Absolute: 3.7 x10E3/uL (ref 1.4–7.0)
Neutrophils: 58 %
Platelets: 243 x10E3/uL (ref 150–450)
RBC: 3.75 x10E6/uL — ABNORMAL LOW (ref 3.77–5.28)
RDW: 13.9 % (ref 11.7–15.4)
WBC: 6.3 x10E3/uL (ref 3.4–10.8)

## 2024-10-19 LAB — LIPID PANEL
Chol/HDL Ratio: 5.9 ratio — ABNORMAL HIGH (ref 0.0–4.4)
Cholesterol, Total: 294 mg/dL — ABNORMAL HIGH (ref 100–199)
HDL: 50 mg/dL (ref >39)
LDL Chol Calc (NIH): 139 mg/dL — ABNORMAL HIGH (ref 0–99)
Triglycerides: 564 mg/dL (ref 0–149)
VLDL Cholesterol Cal: 105 mg/dL — ABNORMAL HIGH (ref 5–40)

## 2024-10-19 LAB — VITAMIN D 25 HYDROXY (VIT D DEFICIENCY, FRACTURES): Vit D, 25-Hydroxy: 31.4 ng/mL (ref 30.0–100.0)

## 2024-10-31 ENCOUNTER — Encounter: Payer: Self-pay | Admitting: Radiology

## 2024-11-01 ENCOUNTER — Ambulatory Visit: Payer: Self-pay | Admitting: Family Medicine

## 2024-11-01 MED ORDER — ROSUVASTATIN CALCIUM 40 MG PO TABS: 40.0000 mg | ORAL_TABLET | Freq: Every day | ORAL | 3 refills | Status: AC

## 2024-11-01 MED ORDER — FENOFIBRATE 48 MG PO TABS: 48.0000 mg | ORAL_TABLET | Freq: Every day | ORAL | 0 refills | Status: AC

## 2024-11-02 NOTE — Progress Notes (Signed)
 Patient reviewed lab results and provider recommendations via MyChart

## 2024-11-09 ENCOUNTER — Other Ambulatory Visit: Payer: Self-pay | Admitting: Family Medicine

## 2024-11-14 ENCOUNTER — Ambulatory Visit: Admitting: Family Medicine

## 2024-11-14 ENCOUNTER — Encounter: Payer: Self-pay | Admitting: Family Medicine

## 2024-11-14 VITALS — BP 126/82 | HR 82 | Temp 98.2°F | Ht 69.5 in | Wt 195.2 lb

## 2024-11-14 DIAGNOSIS — E785 Hyperlipidemia, unspecified: Secondary | ICD-10-CM

## 2024-11-14 DIAGNOSIS — R030 Elevated blood-pressure reading, without diagnosis of hypertension: Secondary | ICD-10-CM

## 2024-11-14 DIAGNOSIS — K581 Irritable bowel syndrome with constipation: Secondary | ICD-10-CM

## 2024-11-14 DIAGNOSIS — M545 Low back pain, unspecified: Secondary | ICD-10-CM | POA: Diagnosis not present

## 2024-11-14 DIAGNOSIS — F4323 Adjustment disorder with mixed anxiety and depressed mood: Secondary | ICD-10-CM

## 2024-11-14 MED ORDER — CYCLOBENZAPRINE HCL 10 MG PO TABS
10.0000 mg | ORAL_TABLET | Freq: Every day | ORAL | 1 refills | Status: AC
Start: 2024-11-14 — End: ?

## 2024-11-14 MED ORDER — SERTRALINE HCL 100 MG PO TABS
100.0000 mg | ORAL_TABLET | Freq: Every day | ORAL | 1 refills | Status: AC
Start: 2024-11-14 — End: ?

## 2024-11-14 NOTE — Progress Notes (Unsigned)
 Established Patient Office Visit  Subjective    Patient ID: Erica Lang, female    DOB: Jan 11, 1976  Age: 48 y.o. MRN: [id number]  CC:  Chief Complaint  Patient presents with   Medical Management of Chronic Issues    Pt reports pain in lower back that comes and goes     HPI Erica Lang presents with complaint of left sided lower back pain for a couple of months. Symptoms are intermittent. Patient denies known trauma or injury. Patient is also for routine follow up of chronic med issues including hypertension, anxiety/depression, and IBS.   Outpatient Encounter Medications as of 11/14/2024  Medication Sig   albuterol  (PROVENTIL  HFA;VENTOLIN  HFA) 108 (90 BASE) MCG/ACT inhaler Inhale 2 puffs into the lungs every 6 (six) hours as needed for wheezing.   Biotin 89999 MCG TBDP Take 10,000 mcg by mouth daily.   Calcium Carb-Cholecalciferol (CALCIUM 600 + D PO) Take 1 tablet by mouth daily.   cetirizine-pseudoephedrine (ZYRTEC-D) 5-120 MG tablet Take 1 tablet by mouth 2 (two) times daily.   cyclobenzaprine  (FLEXERIL ) 10 MG tablet Take 1 tablet (10 mg total) by mouth at bedtime.   fenofibrate (TRICOR) 48 MG tablet Take 1 tablet (48 mg total) by mouth daily.   Potassium 99 MG TABS Take 99 mg by mouth daily.   rosuvastatin (CRESTOR) 40 MG tablet Take 1 tablet (40 mg total) by mouth daily.   sertraline (ZOLOFT) 100 MG tablet Take 1 tablet (100 mg total) by mouth daily.   sertraline (ZOLOFT) 50 MG tablet Take 1 tablet (50 mg total) by mouth daily.   Vitamin D , Ergocalciferol , (DRISDOL ) 1.25 MG (50000 UNIT) CAPS capsule Take 1 capsule (50,000 Units total) by mouth every 7 (seven) days.   calcium carbonate (TUMS - DOSED IN MG ELEMENTAL CALCIUM) 500 MG chewable tablet Chew 1 tablet by mouth daily as needed for indigestion or heartburn. (Patient not taking: Reported on 11/14/2024)   EPINEPHrine  (EPI-PEN) 0.3 mg/0.3 mL DEVI Inject 0.3 mLs (0.3 mg total) into the muscle once.   ferrous sulfate 325  (65 FE) MG EC tablet Take 325 mg by mouth at bedtime. (Patient not taking: Reported on 11/14/2024)   gabapentin  (NEURONTIN ) 100 MG capsule Take 2-3 capsules (200-300 mg total) by mouth at bedtime as needed. (Patient not taking: Reported on 10/17/2024)   HYDROcodone -acetaminophen  (NORCO) 5-325 MG tablet Take 1 tablet by mouth 3 (three) times daily as needed. To be taken after surgery (Patient not taking: Reported on 10/17/2024)   Melatonin 10 MG TABS Take 10 mg by mouth at bedtime as needed (sleep). (Patient not taking: Reported on 10/17/2024)   ondansetron  (ZOFRAN ) 4 MG tablet Take 1 tablet (4 mg total) by mouth every 8 (eight) hours as needed for nausea or vomiting. (Patient not taking: Reported on 10/17/2024)   No facility-administered encounter medications on file as of 11/14/2024.    Past Medical History:  Diagnosis Date   Anemia    Anxiety    Bilateral carpal tunnel syndrome    left only per patient   Headache    History of abnormal cervical Pap smear    History of exercise stress test dr delford   11-11-2013--  normal/  good exercise capacity, no chest pain, no ischemia   History of pre-eclampsia 11/ 2014   hypertension and postpartum pulmonary edema--  resolved   IBS (irritable bowel syndrome)    Mild asthma    PCOS (polycystic ovarian syndrome)     Past Surgical History:  Procedure Laterality Date   CARPAL TUNNEL RELEASE Right 08/02/2015   Procedure: RIGHT HAND CARPAL TUNNEL RELEASE;  Surgeon: Prentice Pagan, MD;  Location: [facility];  Service: Orthopedics;  Laterality: Right;   CHOLECYSTECTOMY N/A 10/27/2014   Procedure: LAPAROSCOPIC CHOLECYSTECTOMY WITH INTRAOPERATIVE CHOLANGIOGRAM;  Surgeon: Camellia CHRISTELLA Blush, MD;  Location: [facility];  Service: General;  Laterality: N/A;   dx laparoscopy     I & D  LEFT VULVAR HEMATOMA, POSTPARTUM  07-05-2009   ROBOTIC ASSISTED TOTAL HYSTERECTOMY WITH SALPINGECTOMY Bilateral 06/18/2017   Procedure: ROBOTIC ASSISTED TOTAL  HYSTERECTOMY WITH SALPINGECTOMY;  Surgeon: Gorge Ade, MD;  Location: [facility];  Service: Gynecology;  Laterality: Bilateral;   TRANSTHORACIC ECHOCARDIOGRAM  10-28-2013   normal LVF,  ef 60-65%,  mild AR , MR and , TR    Family History  Problem Relation Age of Onset   Stroke Mother    Hypertension Mother    Heart disease Mother    Deep vein thrombosis Mother        both legs, arms and back, 2 blood clots migrted to her chest area   Diabetes Mother     Social History   Socioeconomic History   Marital status: Married    Spouse name: Not on file   Number of children: 2   Years of education: Not on file   Highest education level: Associate degree: occupational, scientist, product/process development, or vocational program  Occupational History    Employer: [employer]    Comment: [employer]  Tobacco Use   Smoking status: Never    Passive exposure: Never   Smokeless tobacco: Never  Vaping Use   Vaping status: Never Used  Substance and Sexual Activity   Alcohol use: Yes    Alcohol/week: 0.0 standard drinks of alcohol    Comment: occasional   Drug use: No   Sexual activity: Yes    Birth control/protection: Injection    Comment: depo  Other Topics Concern   Not on file  Social History Narrative   Not on file   Social Drivers of Health   Financial Resource Strain: Low Risk  (10/14/2024)   Overall Financial Resource Strain (CARDIA)    Difficulty of Paying Living Expenses: Not very hard  Food Insecurity: No Food Insecurity (10/14/2024)   Hunger Vital Sign    Worried About Running Out of Food in the Last Year: Never true    Ran Out of Food in the Last Year: Never true  Transportation Needs: No Transportation Needs (10/14/2024)   PRAPARE - Administrator, Civil Service (Medical): No    Lack of Transportation (Non-Medical): No  Physical Activity: Insufficiently Active (10/14/2024)   Exercise Vital Sign    Days of Exercise per Week: 4 days    Minutes of Exercise  per Session: 30 min  Stress: Stress Concern Present (10/14/2024)   [employer] of Occupational Health - Occupational Stress Questionnaire    Feeling of Stress: Rather much  Social Connections: Moderately Integrated (10/14/2024)   Social Connection and Isolation Panel    Frequency of Communication with Friends and Family: Three times a week    Frequency of Social Gatherings with Friends and Family: Patient declined    Attends Religious Services: More than 4 times per year    Active Member of [employer] or Organizations: No    Attends Banker Meetings: Not on file    Marital Status: Married  Intimate Partner Violence: Not At Risk (12/08/2023)   Humiliation,  Afraid, Rape, and Kick questionnaire    Fear of Current or Ex-Partner: No    Emotionally Abused: No    Physically Abused: No    Sexually Abused: No    Review of Systems  Musculoskeletal:  Positive for back pain.  All other systems reviewed and are negative.       Objective    BP 126/82   Pulse 82   Temp 98.2 F (36.8 C)   Ht 5' 9.5 (1.765 m)   Wt 195 lb 3.2 oz (88.5 kg)   LMP 06/05/2017   SpO2 100%   BMI 28.41 kg/m   Physical Exam Vitals and nursing note reviewed.  Constitutional:      General: She is not in acute distress. Cardiovascular:     Rate and Rhythm: Normal rate and regular rhythm.  Pulmonary:     Effort: Pulmonary effort is normal.     Breath sounds: Normal breath sounds.  Abdominal:     Palpations: Abdomen is soft.     Tenderness: There is no abdominal tenderness.  Musculoskeletal:     Lumbar back: Spasms and tenderness present. Decreased range of motion.  Neurological:     General: No focal deficit present.     Mental Status: She is alert and oriented to person, place, and time.         Assessment & Plan:  1. Left-sided low back pain without sciatica, unspecified chronicity (Primary) Flexeril  prescribed. Exercises given. Patient to use tylenol /nsaids prn  2. Elevated  blood pressure reading in office with white coat syndrome, without diagnosis of hypertension Monitor.   3. Situational mixed anxiety and depressive disorder Zoloft increased from 50 to 100 mg  4. Hyperlipidemia, unspecified hyperlipidemia type Continue   5. Irritable bowel syndrome with constipation Continue. Discussed dietary and activity options.   Return in about 4 weeks (around 12/12/2024) for follow up.   Erica Raguel SQUIBB, MD

## 2024-11-15 ENCOUNTER — Encounter: Payer: Self-pay | Admitting: Family Medicine

## 2024-12-06 ENCOUNTER — Other Ambulatory Visit: Payer: Self-pay | Admitting: Family Medicine

## 2024-12-14 ENCOUNTER — Ambulatory Visit: Admitting: Family Medicine

## 2024-12-14 ENCOUNTER — Encounter: Payer: Self-pay | Admitting: Family Medicine

## 2024-12-14 VITALS — BP 118/80 | HR 87 | Ht 69.5 in | Wt 199.0 lb

## 2024-12-14 DIAGNOSIS — Z87892 Personal history of anaphylaxis: Secondary | ICD-10-CM | POA: Diagnosis not present

## 2024-12-14 DIAGNOSIS — E782 Mixed hyperlipidemia: Secondary | ICD-10-CM

## 2024-12-14 DIAGNOSIS — I10 Essential (primary) hypertension: Secondary | ICD-10-CM

## 2024-12-14 DIAGNOSIS — M545 Low back pain, unspecified: Secondary | ICD-10-CM

## 2024-12-14 DIAGNOSIS — F4323 Adjustment disorder with mixed anxiety and depressed mood: Secondary | ICD-10-CM

## 2024-12-14 MED ORDER — EPINEPHRINE 0.3 MG/0.3ML IJ SOAJ: 0.3000 mg | INTRAMUSCULAR | 3 refills | Status: AC | PRN

## 2024-12-14 MED ORDER — CYCLOBENZAPRINE HCL 10 MG PO TABS: 10.0000 mg | ORAL_TABLET | Freq: Every day | ORAL | 3 refills | Status: AC

## 2024-12-15 LAB — LIPID PANEL
Chol/HDL Ratio: 2.6 ratio (ref 0.0–4.4)
Cholesterol, Total: 182 mg/dL (ref 100–199)
HDL: 70 mg/dL (ref >39)
LDL Chol Calc (NIH): 97 mg/dL (ref 0–99)
Triglycerides: 80 mg/dL (ref 0–149)
VLDL Cholesterol Cal: 15 mg/dL (ref 5–40)

## 2024-12-16 ENCOUNTER — Encounter: Payer: Self-pay | Admitting: Family Medicine

## 2024-12-16 NOTE — Progress Notes (Signed)
 "  Established Patient Office Visit  Subjective    Patient ID: Erica Lang, female    DOB: Mar 06, 1976  Age: 48 y.o. MRN: [id number]  CC:  Chief Complaint  Patient presents with   Medical Management of Chronic Issues    Needs new epi pen     HPI Erica Lang presents for follow up of hypertension and anxiety/depression. Patient reports med compliance.   Outpatient Encounter Medications as of 12/14/2024  Medication Sig   albuterol  (PROVENTIL  HFA;VENTOLIN  HFA) 108 (90 BASE) MCG/ACT inhaler Inhale 2 puffs into the lungs every 6 (six) hours as needed for wheezing.   Biotin 89999 MCG TBDP Take 10,000 mcg by mouth daily.   Calcium  Carb-Cholecalciferol (CALCIUM  600 + D PO) Take 1 tablet by mouth daily.   cetirizine-pseudoephedrine (ZYRTEC-D) 5-120 MG tablet Take 1 tablet by mouth 2 (two) times daily.   EPINEPHrine  (EPI-PEN) 0.3 mg/0.3 mL DEVI Inject 0.3 mLs (0.3 mg total) into the muscle once.   EPINEPHrine  0.3 mg/0.3 mL IJ SOAJ injection Inject 0.3 mg into the muscle as needed for anaphylaxis.   fenofibrate  (TRICOR ) 48 MG tablet Take 1 tablet (48 mg total) by mouth daily.   Potassium 99 MG TABS Take 99 mg by mouth daily.   rosuvastatin  (CRESTOR ) 40 MG tablet Take 1 tablet (40 mg total) by mouth daily.   sertraline  (ZOLOFT ) 100 MG tablet TAKE 1 TABLET BY MOUTH EVERY DAY   Vitamin D , Ergocalciferol , (DRISDOL ) 1.25 MG (50000 UNIT) CAPS capsule Take 1 capsule (50,000 Units total) by mouth every 7 (seven) days.   [DISCONTINUED] cyclobenzaprine  (FLEXERIL ) 10 MG tablet Take 1 tablet (10 mg total) by mouth at bedtime.   calcium  carbonate (TUMS - DOSED IN MG ELEMENTAL CALCIUM ) 500 MG chewable tablet Chew 1 tablet by mouth daily as needed for indigestion or heartburn. (Patient not taking: Reported on 11/14/2024)   cyclobenzaprine  (FLEXERIL ) 10 MG tablet Take 1 tablet (10 mg total) by mouth at bedtime.   ferrous sulfate 325 (65 FE) MG EC tablet Take 325 mg by mouth at bedtime. (Patient not taking:  Reported on 11/14/2024)   gabapentin  (NEURONTIN ) 100 MG capsule Take 2-3 capsules (200-300 mg total) by mouth at bedtime as needed. (Patient not taking: Reported on 10/17/2024)   HYDROcodone -acetaminophen  (NORCO) 5-325 MG tablet Take 1 tablet by mouth 3 (three) times daily as needed. To be taken after surgery (Patient not taking: Reported on 10/17/2024)   Melatonin 10 MG TABS Take 10 mg by mouth at bedtime as needed (sleep). (Patient not taking: Reported on 10/17/2024)   ondansetron  (ZOFRAN ) 4 MG tablet Take 1 tablet (4 mg total) by mouth every 8 (eight) hours as needed for nausea or vomiting. (Patient not taking: Reported on 10/17/2024)   sertraline  (ZOLOFT ) 50 MG tablet TAKE 1 TABLET BY MOUTH EVERY DAY   No facility-administered encounter medications on file as of 12/14/2024.    Past Medical History:  Diagnosis Date   Anemia    Anxiety    Bilateral carpal tunnel syndrome    left only per patient   Headache    History of abnormal cervical Pap smear    History of exercise stress test dr delford   11-11-2013--  normal/  good exercise capacity, no chest pain, no ischemia   History of pre-eclampsia 11/ 2014   hypertension and postpartum pulmonary edema--  resolved   IBS (irritable bowel syndrome)    Mild asthma    PCOS (polycystic ovarian syndrome)     Past Surgical History:  Procedure Laterality Date   CARPAL TUNNEL RELEASE Right 08/02/2015   Procedure: RIGHT HAND CARPAL TUNNEL RELEASE;  Surgeon: Prentice Pagan, MD;  Location: [facility] [place];  Service: Orthopedics;  Laterality: Right;   CHOLECYSTECTOMY N/A 10/27/2014   Procedure: LAPAROSCOPIC CHOLECYSTECTOMY WITH INTRAOPERATIVE CHOLANGIOGRAM;  Surgeon: Camellia CHRISTELLA Blush, MD;  Location: [facility];  Service: General;  Laterality: N/A;   dx laparoscopy     I & D  LEFT VULVAR HEMATOMA, POSTPARTUM  07-05-2009   ROBOTIC ASSISTED TOTAL HYSTERECTOMY WITH SALPINGECTOMY Bilateral 06/18/2017   Procedure: ROBOTIC ASSISTED TOTAL HYSTERECTOMY WITH  SALPINGECTOMY;  Surgeon: Gorge Ade, MD;  Location: [facility];  Service: Gynecology;  Laterality: Bilateral;   TRANSTHORACIC ECHOCARDIOGRAM  10-28-2013   normal LVF,  ef 60-65%,  mild AR , MR and , TR    Family History  Problem Relation Age of Onset   Stroke Mother    Hypertension Mother    Heart disease Mother    Deep vein thrombosis Mother        both legs, arms and back, 2 blood clots migrted to her chest area   Diabetes Mother     Social History   Socioeconomic History   Marital status: Married    Spouse name: Not on file   Number of children: 2   Years of education: Not on file   Highest education level: Associate degree: occupational, scientist, product/process development, or vocational program  Occupational History    Employer: [employer]    Comment: [employer]  Tobacco Use   Smoking status: Never    Passive exposure: Never   Smokeless tobacco: Never  Vaping Use   Vaping status: Never Used  Substance and Sexual Activity   Alcohol use: Yes    Alcohol/week: 0.0 standard drinks of alcohol    Comment: occasional   Drug use: No   Sexual activity: Yes    Birth control/protection: Injection    Comment: depo  Other Topics Concern   Not on file  Social History Narrative   Not on file   Social Drivers of Health   Tobacco Use: Low Risk (12/14/2024)   Patient History    Smoking Tobacco Use: Never    Smokeless Tobacco Use: Never    Passive Exposure: Never  Financial Resource Strain: Low Risk (10/14/2024)   Overall Financial Resource Strain (CARDIA)    Difficulty of Paying Living Expenses: Not very hard  Food Insecurity: No Food Insecurity (10/14/2024)   Epic    Worried About Programme Researcher, Broadcasting/film/video in the Last Year: Never true    Ran Out of Food in the Last Year: Never true  Transportation Needs: No Transportation Needs (10/14/2024)   Epic    Lack of Transportation (Medical): No    Lack of Transportation (Non-Medical): No  Physical Activity: Insufficiently Active  (10/14/2024)   Exercise Vital Sign    Days of Exercise per Week: 4 days    Minutes of Exercise per Session: 30 min  Stress: Stress Concern Present (10/14/2024)   [employer] of Occupational Health - Occupational Stress Questionnaire    Feeling of Stress: Rather much  Social Connections: Moderately Integrated (10/14/2024)   Social Connection and Isolation Panel    Frequency of Communication with Friends and Family: Three times a week    Frequency of Social Gatherings with Friends and Family: Patient declined    Attends Religious Services: More than 4 times per year    Active Member of Clubs or Organizations: No  Attends Banker Meetings: Not on file    Marital Status: Married  Intimate Partner Violence: Not At Risk (12/08/2023)   Humiliation, Afraid, Rape, and Kick questionnaire    Fear of Current or Ex-Partner: No    Emotionally Abused: No    Physically Abused: No    Sexually Abused: No  Depression (PHQ2-9): Low Risk (03/07/2024)   Depression (PHQ2-9)    PHQ-2 Score: 0  Alcohol Screen: Low Risk (10/14/2024)   Alcohol Screen    Last Alcohol Screening Score (AUDIT): 1  Housing: Unknown (10/14/2024)   Epic    Unable to Pay for Housing in the Last Year: No    Number of Times Moved in the Last Year: Not on file    Homeless in the Last Year: No  Utilities: Not At Risk (12/08/2023)   [facility] Utilities    Threatened with loss of utilities: No  Health Literacy: Adequate Health Literacy (12/08/2023)   B1300 Health Literacy    Frequency of need for help with medical instructions: Never    Review of Systems  All other systems reviewed and are negative.       Objective    BP 118/80   Pulse 87   Ht 5' 9.5 (1.765 m)   Wt 199 lb (90.3 kg)   LMP 06/05/2017   SpO2 98%   BMI 28.97 kg/m   Physical Exam Vitals and nursing note reviewed.  Constitutional:      General: She is not in acute distress. Cardiovascular:     Rate and Rhythm: Normal rate and regular  rhythm.  Pulmonary:     Effort: Pulmonary effort is normal.     Breath sounds: Normal breath sounds.  Abdominal:     Palpations: Abdomen is soft.     Tenderness: There is no abdominal tenderness.  Neurological:     General: No focal deficit present.     Mental Status: She is alert and oriented to person, place, and time.  Psychiatric:        Mood and Affect: Mood normal.        Behavior: Behavior normal.         Assessment & Plan:   1. Essential hypertension (Primary) Appears stable. continue  2. Mixed hyperlipidemia Continue  - Lipid Panel  3. Situational mixed anxiety and depressive disorder Appears stable. Continue   4. Left-sided low back pain without sciatica, unspecified chronicity Flexeril  refilled  5. History of anaphylaxis Epi-pen refilled    Return in about 6 months (around 06/14/2025) for follow up, chronic med issues.   Tanda Raguel SQUIBB, MD  "

## 2025-06-14 ENCOUNTER — Ambulatory Visit: Payer: Self-pay | Admitting: Family Medicine
